# Patient Record
Sex: Female | Born: 1945 | Race: White | Hispanic: No | Smoking: Never smoker
Health system: Southern US, Community
[De-identification: ages and names within clinical notes are randomized; demographics above are authoritative.]

## PROBLEM LIST (undated history)

## (undated) DIAGNOSIS — E119 Type 2 diabetes mellitus without complications: Secondary | ICD-10-CM

## (undated) DIAGNOSIS — C9590 Leukemia, unspecified not having achieved remission: Secondary | ICD-10-CM

---

## 2016-05-27 ENCOUNTER — Inpatient Hospital Stay (HOSPITAL_COMMUNITY)
Admission: EM | Admit: 2016-05-27 | Discharge: 2016-06-11 | DRG: 853 | Disposition: A | Discharge status: Skilled Nursing Facility | Payer: Medicare Other | Source: Other Acute Inpatient Hospital | Attending: Internal Medicine | Admitting: Internal Medicine

## 2016-05-27 ENCOUNTER — Inpatient Hospital Stay (HOSPITAL_COMMUNITY): Payer: Self-pay

## 2016-05-27 ENCOUNTER — Inpatient Hospital Stay (HOSPITAL_COMMUNITY): Payer: Medicare Other

## 2016-05-27 ENCOUNTER — Encounter (HOSPITAL_COMMUNITY): Payer: Self-pay | Admitting: Pulmonary Disease

## 2016-05-27 ENCOUNTER — Ambulatory Visit (HOSPITAL_COMMUNITY): Payer: Medicare Other

## 2016-05-27 DIAGNOSIS — N179 Acute kidney failure, unspecified: Secondary | ICD-10-CM | POA: Diagnosis present

## 2016-05-27 DIAGNOSIS — E8729 Other acidosis: Secondary | ICD-10-CM | POA: Diagnosis present

## 2016-05-27 DIAGNOSIS — L97521 Non-pressure chronic ulcer of other part of left foot limited to breakdown of skin: Secondary | ICD-10-CM | POA: Diagnosis not present

## 2016-05-27 DIAGNOSIS — E1165 Type 2 diabetes mellitus with hyperglycemia: Secondary | ICD-10-CM | POA: Diagnosis present

## 2016-05-27 DIAGNOSIS — A419 Sepsis, unspecified organism: Secondary | ICD-10-CM

## 2016-05-27 DIAGNOSIS — B9561 Methicillin susceptible Staphylococcus aureus infection as the cause of diseases classified elsewhere: Secondary | ICD-10-CM | POA: Diagnosis not present

## 2016-05-27 DIAGNOSIS — J984 Other disorders of lung: Secondary | ICD-10-CM | POA: Diagnosis not present

## 2016-05-27 DIAGNOSIS — M86172 Other acute osteomyelitis, left ankle and foot: Secondary | ICD-10-CM | POA: Diagnosis present

## 2016-05-27 DIAGNOSIS — J9601 Acute respiratory failure with hypoxia: Secondary | ICD-10-CM | POA: Diagnosis present

## 2016-05-27 DIAGNOSIS — I639 Cerebral infarction, unspecified: Secondary | ICD-10-CM | POA: Diagnosis not present

## 2016-05-27 DIAGNOSIS — M009 Pyogenic arthritis, unspecified: Secondary | ICD-10-CM | POA: Diagnosis present

## 2016-05-27 DIAGNOSIS — R040 Epistaxis: Secondary | ICD-10-CM | POA: Diagnosis not present

## 2016-05-27 DIAGNOSIS — R109 Unspecified abdominal pain: Secondary | ICD-10-CM | POA: Diagnosis present

## 2016-05-27 DIAGNOSIS — G9341 Metabolic encephalopathy: Secondary | ICD-10-CM | POA: Diagnosis present

## 2016-05-27 DIAGNOSIS — M86672 Other chronic osteomyelitis, left ankle and foot: Secondary | ICD-10-CM

## 2016-05-27 DIAGNOSIS — G9340 Encephalopathy, unspecified: Secondary | ICD-10-CM | POA: Diagnosis not present

## 2016-05-27 DIAGNOSIS — I480 Paroxysmal atrial fibrillation: Secondary | ICD-10-CM | POA: Diagnosis not present

## 2016-05-27 DIAGNOSIS — A4101 Sepsis due to Methicillin susceptible Staphylococcus aureus: Secondary | ICD-10-CM | POA: Diagnosis present

## 2016-05-27 DIAGNOSIS — E1122 Type 2 diabetes mellitus with diabetic chronic kidney disease: Secondary | ICD-10-CM | POA: Diagnosis present

## 2016-05-27 DIAGNOSIS — E872 Acidosis: Secondary | ICD-10-CM | POA: Diagnosis present

## 2016-05-27 DIAGNOSIS — R7881 Bacteremia: Secondary | ICD-10-CM | POA: Diagnosis not present

## 2016-05-27 DIAGNOSIS — R579 Shock, unspecified: Secondary | ICD-10-CM | POA: Diagnosis not present

## 2016-05-27 DIAGNOSIS — N183 Chronic kidney disease, stage 3 (moderate): Secondary | ICD-10-CM | POA: Diagnosis present

## 2016-05-27 DIAGNOSIS — N170 Acute kidney failure with tubular necrosis: Secondary | ICD-10-CM | POA: Diagnosis present

## 2016-05-27 DIAGNOSIS — E1142 Type 2 diabetes mellitus with diabetic polyneuropathy: Secondary | ICD-10-CM

## 2016-05-27 DIAGNOSIS — E11621 Type 2 diabetes mellitus with foot ulcer: Secondary | ICD-10-CM | POA: Diagnosis not present

## 2016-05-27 DIAGNOSIS — E43 Unspecified severe protein-calorie malnutrition: Secondary | ICD-10-CM | POA: Diagnosis present

## 2016-05-27 DIAGNOSIS — R531 Weakness: Secondary | ICD-10-CM

## 2016-05-27 DIAGNOSIS — M7989 Other specified soft tissue disorders: Secondary | ICD-10-CM

## 2016-05-27 DIAGNOSIS — I76 Septic arterial embolism: Secondary | ICD-10-CM | POA: Diagnosis present

## 2016-05-27 DIAGNOSIS — I10 Essential (primary) hypertension: Secondary | ICD-10-CM | POA: Diagnosis present

## 2016-05-27 DIAGNOSIS — I2690 Septic pulmonary embolism without acute cor pulmonale: Secondary | ICD-10-CM | POA: Diagnosis present

## 2016-05-27 DIAGNOSIS — R0600 Dyspnea, unspecified: Secondary | ICD-10-CM | POA: Diagnosis not present

## 2016-05-27 DIAGNOSIS — Z4659 Encounter for fitting and adjustment of other gastrointestinal appliance and device: Secondary | ICD-10-CM

## 2016-05-27 DIAGNOSIS — T884XXA Failed or difficult intubation, initial encounter: Secondary | ICD-10-CM

## 2016-05-27 DIAGNOSIS — Z6833 Body mass index (BMI) 33.0-33.9, adult: Secondary | ICD-10-CM

## 2016-05-27 DIAGNOSIS — M868X7 Other osteomyelitis, ankle and foot: Secondary | ICD-10-CM | POA: Diagnosis not present

## 2016-05-27 DIAGNOSIS — E875 Hyperkalemia: Secondary | ICD-10-CM | POA: Diagnosis present

## 2016-05-27 DIAGNOSIS — K828 Other specified diseases of gallbladder: Secondary | ICD-10-CM | POA: Diagnosis present

## 2016-05-27 DIAGNOSIS — J189 Pneumonia, unspecified organism: Secondary | ICD-10-CM

## 2016-05-27 DIAGNOSIS — Z794 Long term (current) use of insulin: Secondary | ICD-10-CM

## 2016-05-27 DIAGNOSIS — D638 Anemia in other chronic diseases classified elsewhere: Secondary | ICD-10-CM | POA: Diagnosis present

## 2016-05-27 DIAGNOSIS — R262 Difficulty in walking, not elsewhere classified: Secondary | ICD-10-CM

## 2016-05-27 DIAGNOSIS — D6959 Other secondary thrombocytopenia: Secondary | ICD-10-CM | POA: Diagnosis not present

## 2016-05-27 DIAGNOSIS — R1084 Generalized abdominal pain: Secondary | ICD-10-CM

## 2016-05-27 DIAGNOSIS — Z95828 Presence of other vascular implants and grafts: Secondary | ICD-10-CM | POA: Diagnosis not present

## 2016-05-27 DIAGNOSIS — R29708 NIHSS score 8: Secondary | ICD-10-CM | POA: Diagnosis not present

## 2016-05-27 DIAGNOSIS — R6521 Severe sepsis with septic shock: Secondary | ICD-10-CM | POA: Diagnosis present

## 2016-05-27 DIAGNOSIS — L97529 Non-pressure chronic ulcer of other part of left foot with unspecified severity: Secondary | ICD-10-CM | POA: Diagnosis not present

## 2016-05-27 DIAGNOSIS — I340 Nonrheumatic mitral (valve) insufficiency: Secondary | ICD-10-CM | POA: Diagnosis not present

## 2016-05-27 DIAGNOSIS — C9110 Chronic lymphocytic leukemia of B-cell type not having achieved remission: Secondary | ICD-10-CM | POA: Diagnosis present

## 2016-05-27 DIAGNOSIS — Z9181 History of falling: Secondary | ICD-10-CM

## 2016-05-27 DIAGNOSIS — Z89429 Acquired absence of other toe(s), unspecified side: Secondary | ICD-10-CM

## 2016-05-27 DIAGNOSIS — Z79899 Other long term (current) drug therapy: Secondary | ICD-10-CM

## 2016-05-27 DIAGNOSIS — E1169 Type 2 diabetes mellitus with other specified complication: Secondary | ICD-10-CM | POA: Diagnosis present

## 2016-05-27 DIAGNOSIS — L97509 Non-pressure chronic ulcer of other part of unspecified foot with unspecified severity: Secondary | ICD-10-CM

## 2016-05-27 DIAGNOSIS — B9689 Other specified bacterial agents as the cause of diseases classified elsewhere: Secondary | ICD-10-CM | POA: Diagnosis not present

## 2016-05-27 DIAGNOSIS — Z452 Encounter for adjustment and management of vascular access device: Secondary | ICD-10-CM

## 2016-05-27 DIAGNOSIS — M869 Osteomyelitis, unspecified: Secondary | ICD-10-CM | POA: Diagnosis not present

## 2016-05-27 HISTORY — DX: Type 2 diabetes mellitus without complications: E11.9

## 2016-05-27 HISTORY — DX: Leukemia, unspecified not having achieved remission: C95.90

## 2016-05-27 LAB — COMPREHENSIVE METABOLIC PANEL
ALT: 39 U/L (ref 14–54)
AST: 57 U/L — AB (ref 15–41)
Albumin: 1.8 g/dL — ABNORMAL LOW (ref 3.5–5.0)
Alkaline Phosphatase: 164 U/L — ABNORMAL HIGH (ref 38–126)
Anion gap: 27 — ABNORMAL HIGH (ref 5–15)
BILIRUBIN TOTAL: 0.9 mg/dL (ref 0.3–1.2)
BUN: 109 mg/dL — AB (ref 6–20)
CHLORIDE: 100 mmol/L — AB (ref 101–111)
CO2: 10 mmol/L — AB (ref 22–32)
Calcium: 6.6 mg/dL — ABNORMAL LOW (ref 8.9–10.3)
Creatinine, Ser: 6.29 mg/dL — ABNORMAL HIGH (ref 0.44–1.00)
GFR calc Af Amer: 7 mL/min — ABNORMAL LOW (ref >60)
GFR calc non Af Amer: 6 mL/min — ABNORMAL LOW (ref >60)
GLUCOSE: 283 mg/dL — AB (ref 65–99)
POTASSIUM: 5.3 mmol/L — AB (ref 3.5–5.1)
SODIUM: 137 mmol/L (ref 135–145)
TOTAL PROTEIN: 4.3 g/dL — AB (ref 6.5–8.1)

## 2016-05-27 LAB — CBC
HCT: 35 % — ABNORMAL LOW (ref 36.0–46.0)
HCT: 31.6 % — ABNORMAL LOW (ref 36.0–46.0)
Hemoglobin: 11.4 g/dL — ABNORMAL LOW (ref 12.0–15.0)
Hemoglobin: 10.9 g/dL — ABNORMAL LOW (ref 12.0–15.0)
MCH: 27.3 pg (ref 26.0–34.0)
MCH: 27.5 pg (ref 26.0–34.0)
MCHC: 32.6 g/dL (ref 30.0–36.0)
MCHC: 34.5 g/dL (ref 30.0–36.0)
MCV: 83.7 fL (ref 78.0–100.0)
MCV: 79.8 fL (ref 78.0–100.0)
PLATELETS: 164 10*3/uL (ref 150–400)
PLATELETS: 125 10*3/uL — AB (ref 150–400)
RBC: 4.18 MIL/uL (ref 3.87–5.11)
RBC: 3.96 MIL/uL (ref 3.87–5.11)
RDW: 15.6 % — AB (ref 11.5–15.5)
RDW: 14.7 % (ref 11.5–15.5)
WBC: 30.9 10*3/uL — ABNORMAL HIGH (ref 4.0–10.5)
WBC: 21.9 10*3/uL — AB (ref 4.0–10.5)

## 2016-05-27 LAB — BLOOD GAS, ARTERIAL
Acid-base deficit: 14.3 mmol/L — ABNORMAL HIGH (ref 0.0–2.0)
Bicarbonate: 12.3 mmol/L — ABNORMAL LOW (ref 20.0–28.0)
DRAWN BY: 362771
FIO2: 100
MECHVT: 500 mL
O2 Saturation: 98.5 %
PEEP: 5 cmH2O
PO2 ART: 331 mmHg — AB (ref 83.0–108.0)
Patient temperature: 98.6
RATE: 26 resp/min
pCO2 arterial: 33.6 mmHg (ref 32.0–48.0)
pH, Arterial: 7.188 — CL (ref 7.350–7.450)

## 2016-05-27 LAB — RESPIRATORY PANEL BY PCR
Adenovirus: NOT DETECTED
Bordetella pertussis: NOT DETECTED
CORONAVIRUS 229E-RVPPCR: NOT DETECTED
CORONAVIRUS HKU1-RVPPCR: NOT DETECTED
CORONAVIRUS NL63-RVPPCR: NOT DETECTED
CORONAVIRUS OC43-RVPPCR: NOT DETECTED
Chlamydophila pneumoniae: NOT DETECTED
Influenza A: NOT DETECTED
Influenza B: NOT DETECTED
MYCOPLASMA PNEUMONIAE-RVPPCR: NOT DETECTED
Metapneumovirus: NOT DETECTED
PARAINFLUENZA VIRUS 1-RVPPCR: NOT DETECTED
Parainfluenza Virus 2: NOT DETECTED
Parainfluenza Virus 3: NOT DETECTED
Parainfluenza Virus 4: NOT DETECTED
Respiratory Syncytial Virus: NOT DETECTED
Rhinovirus / Enterovirus: DETECTED — AB

## 2016-05-27 LAB — URINALYSIS, ROUTINE W REFLEX MICROSCOPIC
Bilirubin Urine: NEGATIVE
Glucose, UA: 150 mg/dL — AB
KETONES UR: NEGATIVE mg/dL
Leukocytes, UA: NEGATIVE
Nitrite: NEGATIVE
PROTEIN: 100 mg/dL — AB
Specific Gravity, Urine: 1.023 (ref 1.005–1.030)
pH: 5 (ref 5.0–8.0)

## 2016-05-27 LAB — POCT I-STAT 3, ART BLOOD GAS (G3+)
Acid-base deficit: 12 mmol/L — ABNORMAL HIGH (ref 0.0–2.0)
Bicarbonate: 13.4 mmol/L — ABNORMAL LOW (ref 20.0–28.0)
O2 Saturation: 99 %
Patient temperature: 98.3
TCO2: 14 mmol/L (ref 0–100)
pCO2 arterial: 28.7 mmHg — ABNORMAL LOW (ref 32.0–48.0)
pH, Arterial: 7.277 — ABNORMAL LOW (ref 7.350–7.450)
pO2, Arterial: 142 mmHg — ABNORMAL HIGH (ref 83.0–108.0)

## 2016-05-27 LAB — CBC WITH DIFFERENTIAL/PLATELET
BASOS ABS: 0 10*3/uL (ref 0.0–0.1)
Basophils Relative: 0 %
EOS PCT: 0 %
Eosinophils Absolute: 0 10*3/uL (ref 0.0–0.7)
HEMATOCRIT: 35.3 % — AB (ref 36.0–46.0)
Hemoglobin: 11.2 g/dL — ABNORMAL LOW (ref 12.0–15.0)
Lymphocytes Relative: 52 %
Lymphs Abs: 17.5 10*3/uL — ABNORMAL HIGH (ref 0.7–4.0)
MCH: 26.9 pg (ref 26.0–34.0)
MCHC: 31.7 g/dL (ref 30.0–36.0)
MCV: 84.9 fL (ref 78.0–100.0)
MONO ABS: 1 10*3/uL (ref 0.1–1.0)
MONOS PCT: 3 %
Neutro Abs: 15.2 10*3/uL — ABNORMAL HIGH (ref 1.7–7.7)
Neutrophils Relative %: 45 %
Platelets: 212 10*3/uL (ref 150–400)
RBC: 4.16 MIL/uL (ref 3.87–5.11)
RDW: 15.3 % (ref 11.5–15.5)
WBC: 33.7 10*3/uL — AB (ref 4.0–10.5)

## 2016-05-27 LAB — GLUCOSE, CAPILLARY
GLUCOSE-CAPILLARY: 121 mg/dL — AB (ref 65–99)
GLUCOSE-CAPILLARY: 135 mg/dL — AB (ref 65–99)
GLUCOSE-CAPILLARY: 138 mg/dL — AB (ref 65–99)
GLUCOSE-CAPILLARY: 143 mg/dL — AB (ref 65–99)
GLUCOSE-CAPILLARY: 205 mg/dL — AB (ref 65–99)
GLUCOSE-CAPILLARY: 295 mg/dL — AB (ref 65–99)
GLUCOSE-CAPILLARY: 303 mg/dL — AB (ref 65–99)
Glucose-Capillary: 287 mg/dL — ABNORMAL HIGH (ref 65–99)
Glucose-Capillary: 303 mg/dL — ABNORMAL HIGH (ref 65–99)
Glucose-Capillary: 244 mg/dL — ABNORMAL HIGH (ref 65–99)
Glucose-Capillary: 186 mg/dL — ABNORMAL HIGH (ref 65–99)
Glucose-Capillary: 135 mg/dL — ABNORMAL HIGH (ref 65–99)
Glucose-Capillary: 137 mg/dL — ABNORMAL HIGH (ref 65–99)
Glucose-Capillary: 108 mg/dL — ABNORMAL HIGH (ref 65–99)
Glucose-Capillary: 141 mg/dL — ABNORMAL HIGH (ref 65–99)

## 2016-05-27 LAB — BASIC METABOLIC PANEL
Anion gap: 28 — ABNORMAL HIGH (ref 5–15)
Anion gap: 21 — ABNORMAL HIGH (ref 5–15)
BUN: 112 mg/dL — ABNORMAL HIGH (ref 6–20)
BUN: 109 mg/dL — ABNORMAL HIGH (ref 6–20)
CO2: 13 mmol/L — AB (ref 22–32)
CO2: 21 mmol/L — AB (ref 22–32)
CREATININE: 6.28 mg/dL — AB (ref 0.44–1.00)
CREATININE: 5.7 mg/dL — AB (ref 0.44–1.00)
Calcium: 6.4 mg/dL — CL (ref 8.9–10.3)
Calcium: 5.9 mg/dL — CL (ref 8.9–10.3)
Chloride: 98 mmol/L — ABNORMAL LOW (ref 101–111)
Chloride: 95 mmol/L — ABNORMAL LOW (ref 101–111)
GFR calc Af Amer: 7 mL/min — ABNORMAL LOW (ref >60)
GFR calc non Af Amer: 6 mL/min — ABNORMAL LOW (ref >60)
GFR calc non Af Amer: 7 mL/min — ABNORMAL LOW (ref >60)
GFR, EST AFRICAN AMERICAN: 8 mL/min — AB (ref >60)
GLUCOSE: 262 mg/dL — AB (ref 65–99)
GLUCOSE: 172 mg/dL — AB (ref 65–99)
Potassium: 4.5 mmol/L (ref 3.5–5.1)
Potassium: 4.6 mmol/L (ref 3.5–5.1)
Sodium: 139 mmol/L (ref 135–145)
Sodium: 137 mmol/L (ref 135–145)

## 2016-05-27 LAB — LACTIC ACID, PLASMA
LACTIC ACID, VENOUS: 8.2 mmol/L — AB (ref 0.5–1.9)
LACTIC ACID, VENOUS: 10.8 mmol/L — AB (ref 0.5–1.9)

## 2016-05-27 LAB — SODIUM, URINE, RANDOM: Sodium, Ur: 28 mmol/L

## 2016-05-27 LAB — CREATININE, URINE, RANDOM: CREATININE, URINE: 169.65 mg/dL

## 2016-05-27 LAB — INFLUENZA PANEL BY PCR (TYPE A & B)
INFLAPCR: NEGATIVE
Influenza B By PCR: NEGATIVE

## 2016-05-27 LAB — ECHOCARDIOGRAM COMPLETE: Weight: 3019.42 oz

## 2016-05-27 LAB — TROPONIN I
TROPONIN I: 0.29 ng/mL — AB (ref <0.03)
TROPONIN I: 0.44 ng/mL — AB (ref <0.03)
Troponin I: 0.34 ng/mL (ref <0.03)

## 2016-05-27 LAB — PHOSPHORUS: PHOSPHORUS: 9.2 mg/dL — AB (ref 2.5–4.6)

## 2016-05-27 LAB — CK: CK TOTAL: 1191 U/L — AB (ref 38–234)

## 2016-05-27 LAB — PATHOLOGIST SMEAR REVIEW

## 2016-05-27 LAB — BETA-HYDROXYBUTYRIC ACID: Beta-Hydroxybutyric Acid: 0.57 mmol/L — ABNORMAL HIGH (ref 0.05–0.27)

## 2016-05-27 LAB — PROTIME-INR
INR: 1.5
Prothrombin Time: 18.3 seconds — ABNORMAL HIGH (ref 11.4–15.2)

## 2016-05-27 LAB — LIPID PANEL
CHOLESTEROL: 62 mg/dL (ref 0–200)
HDL: <10 mg/dL — ABNORMAL LOW (ref >40)
TRIGLYCERIDES: 291 mg/dL — AB (ref <150)
VLDL: 58 mg/dL — AB (ref 0–40)

## 2016-05-27 LAB — MAGNESIUM: MAGNESIUM: 1.2 mg/dL — AB (ref 1.7–2.4)

## 2016-05-27 LAB — MRSA PCR SCREENING: MRSA BY PCR: NEGATIVE

## 2016-05-27 MED ORDER — MIDAZOLAM HCL 2 MG/2ML IJ SOLN
INTRAMUSCULAR | Status: AC
  Administered 2016-05-27: 2 mg
  Filled 2016-05-27: qty 2

## 2016-05-27 MED ORDER — MIDAZOLAM HCL 2 MG/2ML IJ SOLN: 1.0000 mg | INTRAMUSCULAR | Status: DC | PRN

## 2016-05-27 MED ORDER — FAMOTIDINE IN NACL 20-0.9 MG/50ML-% IV SOLN
20.0000 mg | INTRAVENOUS | Status: DC
  Filled 2016-05-27: qty 50

## 2016-05-27 MED ORDER — STERILE WATER FOR INJECTION IV SOLN: INTRAVENOUS | Status: DC

## 2016-05-27 MED ORDER — STERILE WATER FOR INJECTION IV SOLN
INTRAVENOUS | Status: DC
  Administered 2016-05-27 – 2016-05-28 (×7): via INTRAVENOUS_CENTRAL
  Filled 2016-05-27 (×16): qty 150

## 2016-05-27 MED ORDER — SODIUM CHLORIDE 0.9 % IV SOLN: 250.0000 mL | INTRAVENOUS | Status: DC | PRN

## 2016-05-27 MED ORDER — INSULIN ASPART 100 UNIT/ML ~~LOC~~ SOLN
1.0000 [IU] | SUBCUTANEOUS | Status: DC
  Administered 2016-05-28: 2 [IU] via SUBCUTANEOUS
  Administered 2016-05-28: 1 [IU] via SUBCUTANEOUS
  Administered 2016-05-28 (×2): 2 [IU] via SUBCUTANEOUS

## 2016-05-27 MED ORDER — CHLORHEXIDINE GLUCONATE 0.12% ORAL RINSE (MEDLINE KIT)
15.0000 mL | Freq: Two times a day (BID) | OROMUCOSAL | Status: DC
  Administered 2016-05-27 – 2016-05-30 (×7): 15 mL via OROMUCOSAL

## 2016-05-27 MED ORDER — SODIUM CHLORIDE 0.9 % IV SOLN: 500.0000 mg | INTRAVENOUS | Status: DC

## 2016-05-27 MED ORDER — DEXTROSE 5 % IV SOLN
0.0000 ug/min | INTRAVENOUS | Status: DC
  Filled 2016-05-27: qty 4

## 2016-05-27 MED ORDER — SODIUM CHLORIDE 0.9 % IV SOLN
250.0000 mL | INTRAVENOUS | Status: DC | PRN
Start: 2016-05-27 — End: 2016-06-11

## 2016-05-27 MED ORDER — PANTOPRAZOLE SODIUM 40 MG IV SOLR
40.0000 mg | Freq: Every day | INTRAVENOUS | Status: DC
  Administered 2016-05-27 – 2016-05-30 (×4): 40 mg via INTRAVENOUS
  Filled 2016-05-27 (×4): qty 40

## 2016-05-27 MED ORDER — ORAL CARE MOUTH RINSE
15.0000 mL | Freq: Four times a day (QID) | OROMUCOSAL | Status: DC
  Administered 2016-05-27 – 2016-05-30 (×14): 15 mL via OROMUCOSAL

## 2016-05-27 MED ORDER — INSULIN ASPART 100 UNIT/ML ~~LOC~~ SOLN
10.0000 [IU] | Freq: Once | SUBCUTANEOUS | Status: AC
  Administered 2016-05-27: 10 [IU] via INTRAVENOUS

## 2016-05-27 MED ORDER — HEPARIN (PORCINE) 2000 UNITS/L FOR CRRT
INTRAVENOUS_CENTRAL | Status: DC | PRN
  Administered 2016-05-28: 20:00:00 via INTRAVENOUS_CENTRAL
  Filled 2016-05-27 (×2): qty 1000

## 2016-05-27 MED ORDER — NOREPINEPHRINE BITARTRATE 1 MG/ML IV SOLN
0.0000 ug/min | INTRAVENOUS | Status: DC
  Administered 2016-05-27: 45 ug/min via INTRAVENOUS
  Administered 2016-05-27: 50 ug/min via INTRAVENOUS
  Administered 2016-05-27: 25 ug/min via INTRAVENOUS
  Administered 2016-05-28: 20 ug/min via INTRAVENOUS
  Administered 2016-05-28: 26 ug/min via INTRAVENOUS
  Administered 2016-05-29: 18 ug/min via INTRAVENOUS
  Administered 2016-05-29: 26.027 ug/min via INTRAVENOUS
  Administered 2016-05-31: 4.053 ug/min via INTRAVENOUS
  Administered 2016-05-31: 0 ug/min via INTRAVENOUS
  Filled 2016-05-27 (×10): qty 16

## 2016-05-27 MED ORDER — SODIUM CHLORIDE 0.9 % IV SOLN
INTRAVENOUS | Status: DC
  Administered 2016-05-27: 2.4 [IU]/h via INTRAVENOUS
  Filled 2016-05-27: qty 2.5

## 2016-05-27 MED ORDER — DEXTROSE 10 % IV SOLN: INTRAVENOUS | Status: DC | PRN

## 2016-05-27 MED ORDER — INSULIN ASPART 100 UNIT/ML ~~LOC~~ SOLN: 2.0000 [IU] | SUBCUTANEOUS | Status: DC

## 2016-05-27 MED ORDER — FENTANYL CITRATE (PF) 100 MCG/2ML IJ SOLN
INTRAMUSCULAR | Status: AC
  Administered 2016-05-27: 100 ug
  Filled 2016-05-27: qty 2

## 2016-05-27 MED ORDER — MEROPENEM 1 G IV SOLR
1.0000 g | Freq: Once | INTRAVENOUS | Status: AC
  Administered 2016-05-27: 1 g via INTRAVENOUS
  Filled 2016-05-27: qty 1

## 2016-05-27 MED ORDER — STROKE: EARLY STAGES OF RECOVERY BOOK
Freq: Once | Status: DC
  Filled 2016-05-27: qty 1

## 2016-05-27 MED ORDER — ASPIRIN 81 MG PO CHEW: 324.0000 mg | CHEWABLE_TABLET | ORAL | Status: DC

## 2016-05-27 MED ORDER — FAMOTIDINE IN NACL 20-0.9 MG/50ML-% IV SOLN
20.0000 mg | Freq: Two times a day (BID) | INTRAVENOUS | Status: DC
  Filled 2016-05-27: qty 50

## 2016-05-27 MED ORDER — MIDAZOLAM HCL 2 MG/2ML IJ SOLN
1.0000 mg | INTRAMUSCULAR | Status: DC | PRN
  Filled 2016-05-27: qty 2

## 2016-05-27 MED ORDER — PRISMASOL BGK 4/2.5 32-4-2.5 MEQ/L IV SOLN
INTRAVENOUS | Status: DC
  Administered 2016-05-27 – 2016-05-29 (×7): via INTRAVENOUS_CENTRAL
  Filled 2016-05-27 (×9): qty 5000

## 2016-05-27 MED ORDER — VASOPRESSIN 20 UNIT/ML IV SOLN
0.0300 [IU]/min | INTRAVENOUS | Status: DC
  Administered 2016-05-27 – 2016-05-31 (×4): 0.03 [IU]/min via INTRAVENOUS
  Filled 2016-05-27 (×5): qty 2

## 2016-05-27 MED ORDER — EPINEPHRINE PF 1 MG/ML IJ SOLN
0.5000 ug/min | INTRAVENOUS | Status: DC
  Administered 2016-05-27: 6 ug/min via INTRAVENOUS
  Filled 2016-05-27 (×2): qty 4

## 2016-05-27 MED ORDER — SODIUM CHLORIDE 0.9 % IV SOLN
1.0000 g | Freq: Two times a day (BID) | INTRAVENOUS | Status: DC
  Administered 2016-05-27 – 2016-05-28 (×2): 1 g via INTRAVENOUS
  Filled 2016-05-27 (×3): qty 1

## 2016-05-27 MED ORDER — ASPIRIN 325 MG PO TABS
325.0000 mg | ORAL_TABLET | Freq: Once | ORAL | Status: AC
  Administered 2016-05-27: 325 mg via ORAL
  Filled 2016-05-27: qty 1

## 2016-05-27 MED ORDER — SODIUM BICARBONATE 8.4 % IV SOLN
INTRAVENOUS | Status: AC
  Administered 2016-05-27 (×2): via INTRAVENOUS
  Filled 2016-05-27 (×6): qty 150

## 2016-05-27 MED ORDER — LINEZOLID 600 MG/300ML IV SOLN
600.0000 mg | Freq: Two times a day (BID) | INTRAVENOUS | Status: DC
  Administered 2016-05-27 – 2016-05-28 (×3): 600 mg via INTRAVENOUS
  Filled 2016-05-27 (×3): qty 300

## 2016-05-27 MED ORDER — PRISMASOL BGK 4/2.5 32-4-2.5 MEQ/L IV SOLN
INTRAVENOUS | Status: DC
  Administered 2016-05-27 – 2016-05-30 (×18): via INTRAVENOUS_CENTRAL
  Filled 2016-05-27 (×35): qty 5000

## 2016-05-27 MED ORDER — HYDROCORTISONE NA SUCCINATE PF 100 MG IJ SOLR
50.0000 mg | Freq: Four times a day (QID) | INTRAMUSCULAR | Status: DC
  Administered 2016-05-27 – 2016-06-01 (×20): 50 mg via INTRAVENOUS
  Filled 2016-05-27 (×4): qty 1
  Filled 2016-05-27: qty 2
  Filled 2016-05-27 (×12): qty 1
  Filled 2016-05-27: qty 2
  Filled 2016-05-27 (×5): qty 1

## 2016-05-27 MED ORDER — ASPIRIN 300 MG RE SUPP: 300.0000 mg | RECTAL | Status: DC

## 2016-05-27 MED ORDER — HEPARIN SODIUM (PORCINE) 5000 UNIT/ML IJ SOLN
5000.0000 [IU] | Freq: Three times a day (TID) | INTRAMUSCULAR | Status: DC
  Administered 2016-05-27 – 2016-06-01 (×14): 5000 [IU] via SUBCUTANEOUS
  Filled 2016-05-27 (×16): qty 1

## 2016-05-27 MED ORDER — FENTANYL CITRATE (PF) 100 MCG/2ML IJ SOLN
50.0000 ug | INTRAMUSCULAR | Status: DC | PRN
  Administered 2016-05-27: 50 ug via INTRAVENOUS
  Administered 2016-05-28: 100 ug via INTRAVENOUS
  Administered 2016-05-28 – 2016-05-29 (×2): 50 ug via INTRAVENOUS
  Filled 2016-05-27 (×4): qty 2

## 2016-05-27 MED ORDER — SODIUM BICARBONATE 8.4 % IV SOLN
INTRAVENOUS | Status: AC
  Administered 2016-05-27: 06:00:00
  Filled 2016-05-27: qty 100

## 2016-05-27 MED ORDER — HEPARIN SODIUM (PORCINE) 1000 UNIT/ML DIALYSIS
1000.0000 [IU] | INTRAMUSCULAR | Status: DC | PRN
  Administered 2016-05-30: 2400 [IU] via INTRAVENOUS_CENTRAL
  Filled 2016-05-27 (×2): qty 6
  Filled 2016-05-27: qty 3

## 2016-05-27 MED ORDER — SODIUM BICARBONATE 8.4 % IV SOLN
INTRAVENOUS | Status: AC
  Administered 2016-05-27: 50 meq
  Filled 2016-05-27: qty 50

## 2016-05-27 MED ORDER — INSULIN GLARGINE 100 UNIT/ML ~~LOC~~ SOLN
10.0000 [IU] | SUBCUTANEOUS | Status: DC
  Administered 2016-05-27: 10 [IU] via SUBCUTANEOUS
  Filled 2016-05-27 (×2): qty 0.1

## 2016-05-27 MED ORDER — FENTANYL CITRATE (PF) 100 MCG/2ML IJ SOLN: 50.0000 ug | INTRAMUSCULAR | Status: DC | PRN

## 2016-05-27 NOTE — Progress Notes (Signed)
RT attempted to get an ABG from patient and was unsuccessful after several attempts between two therapist. RN notified.

## 2016-05-27 NOTE — Procedures (Signed)
Arterial Catheter Insertion Procedure Note Tracy Barker [id number] 07/27/1945  Procedure: Insertion of Arterial Catheter  Indications: Blood pressure monitoring and Frequent blood sampling  Procedure Details Consent: Unable to obtain consent because of altered level of consciousness. Time Out: Verified patient identification, verified procedure, site/side was marked, verified correct patient position, special equipment/implants available, medications/allergies/relevent history reviewed, required imaging and test results available.  Performed Real time [place] used to ID and cannulate vessel  Maximum sterile technique was used including antiseptics, cap, gloves, gown, hand hygiene, mask and sheet. Skin prep: Chlorhexidine; local anesthetic administered 20 gauge catheter was inserted into left femoral artery using the Seldinger technique.  Evaluation Blood flow good; BP tracing good. Complications: No apparent complications.   Tracy Barker 05/27/2016 Tracy Barker ACNP-BC [place] Pager # [phone] OR # [phone] if no answer

## 2016-05-27 NOTE — Progress Notes (Signed)
Patient admitted from Hospital in [place]. [name] At bedside. Intubation performed with 100 fent 2 versed  50 Roc 20 Amidate

## 2016-05-27 NOTE — Consult Note (Signed)
Reason for Consult: Acute kidney injury, metabolic acidosis, hyperkalemia Referring Physician: Tera Partridge M.D. (CCM)  HPI: (Obtained from review of chart as patient is intubated and does not have any family around)  71 year old Caucasian woman with past medical history significant for CLL (unclear treatment status), hypertension and diabetes was transferred to this hospital from the emergency room at [facility] for the management of severe sepsis after she presented there with cough and weakness. Reports noted of recent vomiting. Upon transfer here, was having increasing work of breathing/changes in mentation with inability to protect airway for which she was intubated. She was also started on pressors for shock and workup is underway for etiology of sepsis. There is concern of mesenteric/gut ischemia given resident dictation findings and elevated lactic acid level (of note, she was taking metformin). A review of her previous medical records shows that she has chronic kidney disease stage III and her baseline creatinine is about 1.2-1.3 (GFR 41-45 mL/min). She was on hydrochlorothiazide and ramipril prior to admission.  Upon arrival she was found to be in acute renal failure with a creatinine of 6.3/BUN 109 and profound metabolic acidosis with a bicarbonate of 10 and hyperkalemia of 5.3 after temporizing measures given. Her lactic acid level was elevated at 10.8.  Past Medical History:  Diagnosis Date  . Diabetes mellitus ([place])    Type 2  . Leukemia ([place])    not specified    No past surgical history on file.  No family history on file.  Social History:  has no tobacco, alcohol, and drug history on file.  Allergies:  Allergies  Allergen Reactions  . Amlodipine   . Clindamycin/Lincomycin   . Zosyn [Piperacillin Sod-Tazobactam So]     Medications:  Scheduled: .  stroke: mapping our early stages of recovery book   Does not apply Once  . chlorhexidine gluconate (MEDLINE KIT)   15 mL Mouth Rinse BID  . heparin  5,000 Units Subcutaneous Q8H  . hydrocortisone sod succinate (SOLU-CORTEF) inj  50 mg Intravenous Q6H  . linezolid (ZYVOX) IV  600 mg Intravenous Q12H  . mouth rinse  15 mL Mouth Rinse QID  . [START ON 05/28/2016] meropenem (MERREM) IV  500 mg Intravenous Q24H  . pantoprazole (PROTONIX) IV  40 mg Intravenous QHS    BMP Latest Ref Rng & Units 05/27/2016  Glucose 65 - 99 mg/dL 283(H)  BUN 6 - 20 mg/dL 109(H)  Creatinine 0.44 - 1.00 mg/dL 6.29(H)  Sodium 135 - 145 mmol/L 137  Potassium 3.5 - 5.1 mmol/L 5.3(H)  Chloride 101 - 111 mmol/L 100(L)  CO2 22 - 32 mmol/L 10(L)  Calcium 8.9 - 10.3 mg/dL 6.6(L)   CBC Latest Ref Rng & Units 05/27/2016 05/27/2016  WBC 4.0 - 10.5 K/uL 30.9(H) 33.7(H)  Hemoglobin 12.0 - 15.0 g/dL 11.4(L) 11.2(L)  Hematocrit 36.0 - 46.0 % 35.0(L) 35.3(L)  Platelets 150 - 400 K/uL 164 212     Dg Chest Port 1 View  Result Date: 05/27/2016 CLINICAL DATA:  Difficulty intubation.  Central line placement. EXAM: PORTABLE CHEST 1 VIEW COMPARISON:  None. FINDINGS: Cardiac enlargement without vascular congestion. Right hilum is prominent. This could be due to central pulmonary artery dilatation nor 2 lymphadenopathy. Consider CT for further evaluation. Vague nodular opacities in the mid lungs measuring less than 1 cm. These could also be evaluated at CT. No focal consolidation or airspace disease. No blunting of costophrenic angles. No pneumothorax. Tortuous aorta. Endotracheal tube tip measures 2.2 cm above the carina. IMPRESSION:  Endotracheal tube tip measures 2.2 cm above the carina. Cardiac enlargement. Nonspecific prominence of the right hilum with small nodular opacities in both lungs are indeterminate. CT recommended for further evaluation. Electronically Signed   By: Lucienne Capers M.D.   On: 05/27/2016 05:41   Dg Abd Portable 1v  Result Date: 05/27/2016 CLINICAL DATA:  Orogastric tube placement EXAM: PORTABLE ABDOMEN - 1 VIEW COMPARISON:   None. FINDINGS: Orogastric tube tip and side port are in the stomach. There is a catheter on the right placed in the right femoral region with tip at the expected level of the right common iliac vein. There is no bowel dilatation or air-fluid levels suggesting bowel obstruction. No free air. 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 IMPRESSION: Orogastric tube tip and side port in stomach. Femoral catheter on the right with tip in the expected location of the right common iliac vein. Bowel gas pattern unremarkable. No bowel obstruction or free air evident. Electronically Signed   By: Lowella Grip III M.D.   On: 05/27/2016 08:39    Review of Systems  Unable to perform ROS: Intubated   Blood pressure (!) 102/56, pulse (!) 106, temperature 99 F (37.2 C), temperature source Oral, resp. rate (!) 35, weight 85.6 kg (188 lb 11.4 oz), SpO2 99 %. Physical Exam  Nursing note and vitals reviewed. Constitutional: She appears well-developed and well-nourished.  Intubated  HENT:  Head: Normocephalic and atraumatic.  Nose: Nose normal.  Eyes: Conjunctivae and EOM are normal. Pupils are equal, round, and reactive to light.  Neck: Normal range of motion. Neck supple. No JVD present.  Cardiovascular: Regular rhythm.  Exam reveals no friction rub.   Murmur heard. Regular tachycardia. S1 and S2 with ejection systolic murmur  Respiratory: Breath sounds normal. She has no wheezes. She has no rales.  Clear breath sounds while on ventilator  GI: Soft. Bowel sounds are normal. She exhibits distension. There is tenderness. There is no rebound.  Musculoskeletal: She exhibits edema.  Trace ankle edema. Dusky toes left foot with ulceration over fourth toe.   Skin: Skin is warm and dry.  Ulcers noted over dorsum of left foot fourth toe as well as sacral area.    Assessment/Plan: 1. AKI on CKD stage 3: This is likely to be ATN associated with sepsis-with probable hemodynamic involvement while on ACE  inhibitor/diuretic if she indeed became significantly volume depleted with recent episodes of vomiting. Renal ultrasound pending, will send off for urinalysis and some urine electrolytes. She appears to be anuric at this time and will likely need intervention with renal replacement therapy-CRRT given her hemodynamic instability/hypotension. Volume status is a little on the hypervolemic and and concomitant metabolic acidosis/hyperkalemia require intervention. 2. Sepsis: Unclear source, suspected intra-abdominal pathology including mesenteric ischemia based on elevated lactic acid level-indeed the latter may have been from acute renal failure in the face of ongoing metformin use. 3. Mixed anion-gap and non-anion gap acidosis: From acute renal failure/metformin and recent GI losses. Ongoing isotonic sodium bicarbonate drip at 150 mL/h-this will be discontinued when CRRT started 4. Hyperkalemia: Improved with Kayexalate and additional temporizing measures, continue to monitor with ongoing glycemic control with definitive management with dialysis. 5. Abdominal pain: Raising suspicion of mesenteric/blood ischemia, CT scan of the abdomen ordered.  Leondro Coryell K. 05/27/2016, 12:52 PM

## 2016-05-27 NOTE — Progress Notes (Signed)
SLP Cancellation Note  Patient Details Name: Tracy Barker MRN: [id number] DOB: Feb 19, 1946   Cancelled treatment:       Reason Eval/Treat Not Completed: Patient not medically ready. Orders received for cognitive-linguistic evaluation. Pt is currently intubated. Will follow for readiness to participate.   Germain Osgood 05/27/2016, 8:52 AM  Germain Osgood, M.A. CCC-SLP [phone]

## 2016-05-27 NOTE — Progress Notes (Signed)
CRITICAL VALUE ALERT  Critical value received:  Troponin 0.29    Lactic Acid 8.2  Date of notification:  05/27/16  Time of notification:  0640  Critical value read back:Yes.    Nurse who received alert:  Donald Siva RN  MD notified (1st page):  Carly Rivet  Time of first page:  [phone]  MD notified (2nd page):  Time of second page:  Responding MD:  Albin Felling  Time MD responded:  [phone]

## 2016-05-27 NOTE — Progress Notes (Signed)
  Echocardiogram 2D Echocardiogram has been performed.  Tracy Barker 05/27/2016, 11:49 AM

## 2016-05-27 NOTE — Progress Notes (Signed)
Pharmacy Antibiotic Note  Tracy Barker is a 71 y.o. female admitted on 05/27/2016 with sepsis.  Pharmacy has been consulted for zyvox and meropenem dosing. Patient was transferred from [facility] and was given Zyvox there (little information is available at this time). -labs pending   Plan: -Zyvox 600mg  IV q12h -Meropenem 1gm IV x1 -Will follow renal function for further dosing of meropenem -Will follow renal function, cultures and clinical progress    Weight: 188 lb 11.4 oz (85.6 kg)  Temp (24hrs), Avg:98.6 F (37 C), Min:98.6 F (37 C), Max:98.6 F (37 C)   Recent Labs Lab 05/27/16 0520  WBC 33.7*    CrCl cannot be calculated (No order found.).    Allergies  Allergen Reactions  . Amlodipine   . Clindamycin/Lincomycin   . Zosyn [Piperacillin Sod-Tazobactam So]     Antimicrobials this admission: 1/10 zyvox 1/10 meropenem  Dose adjustments this admission:   Microbiology results: 1/10 blood x2 1/10 urine 1/10 resp 1/10 MRSA PCR  Thank you for allowing pharmacy to be a part of this patient's care.  Hildred Laser, Pharm D 05/27/2016 6:31 AM

## 2016-05-27 NOTE — Progress Notes (Signed)
CRITICAL VALUE ALERT  Critical value received:  Lactic Acid  Date of notification:  05/27/16  Time of notification:  0600  Critical value read back:Yes.    Nurse who received alert:  Donald Siva RN  MD notified (1st page):  Georgann Housekeeper  Time of first page:  0615  MD notified (2nd page):  Time of second page:  Responding MD:  Georgann Housekeeper  Time MD responded:  [phone]

## 2016-05-27 NOTE — Procedures (Signed)
Intubation Procedure Note Tracy Barker [id number] 1945-08-07  Procedure: Intubation Indications: Respiratory insufficiency  Procedure Details Consent: Unable to obtain consent because of emergent medical necessity. Time Out: Verified patient identification, verified procedure, site/side was marked, verified correct patient position, special equipment/implants available, medications/allergies/relevent history reviewed, required imaging and test results available.  Performed  Maximum sterile technique was used including antiseptics, gloves, hand hygiene and mask.  MAC    Evaluation Hemodynamic Status: BP stable throughout; O2 sats: stable throughout Patient's Current Condition: stable Complications: No apparent complications Patient did tolerate procedure well. Chest X-ray ordered to verify placement.  CXR: pending.   Tracy Barker 05/27/2016

## 2016-05-27 NOTE — Care Management Note (Signed)
Case Management Note  Patient Details  Name: Tracy Barker MRN: [id number] Date of Birth: [birth year]  Subjective/Objective:   Pt transferred from [facility] in AKI and shock - pt was intubated on arrival.                  Action/Plan:  Pt is intubated and family not available.  CM will continue to follow for discharge needs   Expected Discharge Date:                  Expected Discharge Plan:     In-House Referral:     Discharge planning Services  CM Consult  Post Acute Care Choice:    Choice offered to:     DME Arranged:    DME Agency:     HH Arranged:    HH Agency:     Status of Service:  In process, will continue to follow  If discussed at Long Length of Stay Meetings, dates discussed:    Additional Comments:  Maryclare Labrador, RN 05/27/2016, 3:45 PM

## 2016-05-27 NOTE — H&P (Signed)
PULMONARY / CRITICAL CARE MEDICINE   Name: Tracy Barker MRN: [id number] DOB: 1945-12-26    ADMISSION DATE:  05/27/2016 CONSULTATION DATE:  05/27/2016  REFERRING MD:  [facility] ED[facility]  CHIEF COMPLAINT:  Weakness  HISTORY OF PRESENT ILLNESS:   71 year old female with PMH significant for DM, HTN, and CLL. Reportedly she is a "frequent flyer" to he ED from, which she was sent, typically for falls. She has recently been seen there for cough x 1 month treated with Zpak, and fall with no apparent injury. 1/9 she presented for weakness x about 24 hours. Also had several episodes of vomiting. EMS was called and upon their arrival she was alert and oriented. In ED she was noted to additionally have some L sided weakness.  Laboratory evaluation significant for hyperkalemia, renal failure, and leukocytosis. She was given broad spectrum antibiotics (mero and linezolid) and temporizing therapies for hyperkalemia were used. Despite these therapies she became progressively more lethargic and developed shock. She was started on levophed with escalating doses, and eventually was started on epinephrine as well. No clear etiology determined, however, septic shock was considered most likely. She was transferred to [facility] for ICU admission.  PAST MEDICAL HISTORY :  She  has no past medical history on file.  PAST SURGICAL HISTORY: She  has no past surgical history on file.  Allergies not on file  No current facility-administered medications on file prior to encounter.    No current outpatient prescriptions on file prior to encounter.    FAMILY HISTORY:  Her has no family status information on file.    SOCIAL HISTORY: She    REVIEW OF SYSTEMS:   Unable as patient is encephalopathic  SUBJECTIVE:    VITAL SIGNS: BP (!) 98/46   Pulse (!) 110   Resp (!) 26   SpO2 99%   HEMODYNAMICS:    VENTILATOR SETTINGS: FiO2 (%):  [100 %] 100 %  INTAKE / OUTPUT: No intake/output data  recorded.  PHYSICAL EXAMINATION: General:  Obese female in respiratory distress Neuro: responds to verbal, one word answers. Encephalopathic.  HEENT:  /AT, PERRL, no JVD Cardiovascular:  RRR, no MRG Lungs:  Diminished bases Abdomen:  Soft, generalized tenderness, non-distended Musculoskeletal:  No acute deformity or ROM limitation. Remote toe amputation Skin: Grossly intact, cyanotic toes (uncertain chronicity)  LABS:  BMET No results for input(s): NA, K, CL, CO2, BUN, CREATININE, GLUCOSE in the last 168 hours.  Electrolytes No results for input(s): CALCIUM, MG, PHOS in the last 168 hours.  CBC No results for input(s): WBC, HGB, HCT, PLT in the last 168 hours.  Coag's No results for input(s): APTT, INR in the last 168 hours.  Sepsis Markers No results for input(s): LATICACIDVEN, PROCALCITON, O2SATVEN in the last 168 hours.  ABG No results for input(s): PHART, PCO2ART, PO2ART in the last 168 hours.  Liver Enzymes No results for input(s): AST, ALT, ALKPHOS, BILITOT, ALBUMIN in the last 168 hours.  Cardiac Enzymes No results for input(s): TROPONINI, PROBNP in the last 168 hours.  Glucose No results for input(s): GLUCAP in the last 168 hours.  Imaging No results found.   STUDIES:  CT head 1/9> non-acute CT abdomen pelvis 1/9 >  CULTURES: Blood 1/10 > Urine 1/10 > Tracheal aspirate 1/10 > Flu 1/10 > RVP 1/10 >  ANTIBIOTICS: Meropenem 1/9 > Linezolid 1/9 >  SIGNIFICANT EVENTS: 1/10 transferred to [facility], intubated  LINES/TUBES: ETT 1/10 >>> R fem CVL 1/10 >>>  DISCUSSION:  ASSESSMENT / PLAN:  PULMONARY A: Acute hypoxemic respiratory failure  P:   Stat intubation CXR and ABG VAP prevention bundle Daily SBT  CARDIOVASCULAR A:  Shock, septic vs hypovolemic Troponin elevation, no EKG ischemic changes  P:  Telemetry monitoring MAP goal 72mmHg Levophed and epinephrine infusions for MAP goal Add vaso Echocardiogram Trend  troponin Consult cardiology in AM  RENAL A:   AKI Hyperkalemia AG metabolic acidosis Lactic + renal failure  P:   CMP now Extended lytes Hydrate Continue HCO3 infusion  GASTROINTESTINAL A:   Vomiting Abd pain  P:   CT abd pelvis NPO Protonix IV  HEMATOLOGIC A:   CLL (Blast crisis?)  P:  Follow CBC Heparin for VTE ppx  INFECTIOUS A:   SIRS - Concern sepsis although source unclear Leukocytosis  P:   Continue broad ABX started at OSH as above Cultures as above  ENDOCRINE A:   DM P:   Holding januvia, metformin, levemir CBG monitoring and SSI Stress dose steroids  NEUROLOGIC A:   Acute metabolic encephalopathy Likely CVA  P:   RASS goal: 0 to -1 PRN sedation MRI brain Consult neuro in AM   FAMILY  - Updates: no family contact information available  - Inter-disciplinary family meet or Palliative Care meeting due by:  1/17   Georgann Housekeeper, AGACNP-BC [place] [facility] Pager [phone] or [phone]  05/27/2016 6:12 AM  Attending Note:  71 year old female with history of CLL and poorly controlled diabetes.  Unsure about treatment for her CLL but following in [facility] supposedly.  Unsure about details of treatment.  Upon arrival, patient was tachypneic, clearly not protecting her airway.  Hypotensive to the 60's on norepi and epi drips infused through IOs with no IV access being transferred by EMS to [facility].  On exam, not protecting airway and severely hypotensive with diffuse crackles in all lung fields.  Femoral TLC was placed (did not want to cover face up while in respiratory failure) that will need an HD catheter and TLC placed in neck for dialysis access (likely) and CVP monitoring.  Supposedly outside hospital called neurology and cardiology here but no documentation of that was presented.  Very little paper work present with patient however.  Discussed with PCCM-NP and Elink-MD.  Evidently was seen by Dr. Simmie Davies in outside  hospital.  Patient was promptly intubated.  Pan culture.  Start merem/zyvox.  Will need to call nephrology, neurology and oncology this AM (will have rounding team make calls given the fact that services switch at 7 AM).  Stress dose steroids.  Continue epi/norepi for BP support.  D/C I/O.  Continue bicarb drip.  F/U on BMET.  May need to start kayexalate but rest of labs are pending.  The patient is critically ill with multiple organ systems failure and requires high complexity decision making for assessment and support, frequent evaluation and titration of therapies, application of advanced monitoring technologies and extensive interpretation of multiple databases.   Critical Care Time devoted to patient care services described in this note is  90  Minutes. This time reflects time of care of this signee Dr Jennet Maduro. This critical care time does not reflect procedure time, or teaching time or supervisory time of PA/NP/Med student/Med Resident etc but could involve care discussion time.  Rush Farmer, M.D. [facility] Pulmonary/Critical Care Medicine. Pager: [phone]. After hours pager: [phone].

## 2016-05-27 NOTE — Progress Notes (Signed)
Pharmacy Antibiotic Note  Tracy Barker is a 71 y.o. female admitted on 05/27/2016 with sepsis.  Pharmacy has been consulted for Zyvox and meropenem dosing.  Patient's SCr is elevated at 6.28 and she is to start CRRT.  Noted she received Merrem 1gm IV around 0730 today.   Plan: - Change Merrem to 1gm IV Q12H, start tonight - Continue Zyvox 600mg  IV Q12H - Monitor CRRT interruptions, clinical progress   Weight: 188 lb 11.4 oz (85.6 kg)  Temp (24hrs), Avg:98.7 F (37.1 C), Min:98.3 F (36.8 C), Max:99 F (37.2 C)   Recent Labs Lab 05/27/16 0520 05/27/16 0749 05/27/16 1103  WBC 33.7*  --  30.9*  CREATININE 6.29*  --  6.28*  LATICACIDVEN 8.2* 10.8*  --     CrCl cannot be calculated (Unknown ideal weight.).    Allergies  Allergen Reactions  . Amlodipine   . Clindamycin/Lincomycin   . Zosyn [Piperacillin Sod-Tazobactam So]     Antimicrobials this admission: 1/10 zyvox 1/10 meropenem  Dose adjustments this admission: N/A  Microbiology results: 1/10 blood x2 1/10 urine 1/10 resp 1/10 MRSA PCR   Tracy Barker, PharmD, BCPS Pager:  [phone] 05/27/2016, 4:43 PM

## 2016-05-27 NOTE — Progress Notes (Signed)
PULMONARY / CRITICAL CARE MEDICINE   Name: Tracy Barker MRN: [id number] DOB: Jun 25, 1945    ADMISSION DATE:  05/27/2016 CONSULTATION DATE:  05/27/2016  REFERRING MD:  [facility]  CHIEF COMPLAINT:  MODS/shock SUBJECTIVE:  Requiring high dose pressors. Left side is weak VITAL SIGNS: BP (!) 116/56   Pulse (!) 110   Temp 98.3 F (36.8 C) (Oral)   Resp (!) 32   Wt 188 lb 11.4 oz (85.6 kg)   SpO2 99%   HEMODYNAMICS:    VENTILATOR SETTINGS: Vent Mode: PRVC FiO2 (%):  [50 %-100 %] 50 % Set Rate:  [26 bmp-35 bmp] 35 bmp Vt Set:  [500 mL] 500 mL PEEP:  [5 cmH20] 5 cmH20 Plateau Pressure:  [18 cmH20] 18 cmH20  INTAKE / OUTPUT: I/O last 3 completed shifts: In: 588.7 [I.V.:588.7] Out: -   PHYSICAL EXAMINATION: General:  Obese female in respiratory distress Neuro: eyes open, rr in 30s, moves right side easily but left weak HEENT:  [place]/AT, PERRL, no JVD, orally intubated  Cardiovascular:  RRR, no MRG Lungs:  Diminished bases + increased rr  Abdomen:  Soft, generalized tenderness, non-distended Musculoskeletal:  No acute deformity or ROM limitation. Remote toe amputation Skin: Grossly intact, cyanotic toes (uncertain chronicity)  LABS:  BMET  Recent Labs Lab 05/27/16 0520  NA 137  K 5.3*  CL 100*  CO2 10*  BUN 109*  CREATININE 6.29*  GLUCOSE 283*    Electrolytes  Recent Labs Lab 05/27/16 0520  CALCIUM 6.6*  MG 1.2*  PHOS 9.2*    CBC  Recent Labs Lab 05/27/16 0520  WBC 33.7*  HGB 11.2*  HCT 35.3*  PLT 212    Coag's  Recent Labs Lab 05/27/16 0520  INR 1.50    Sepsis Markers  Recent Labs Lab 05/27/16 0520  LATICACIDVEN 8.2*    ABG  Recent Labs Lab 05/27/16 0550  PHART 7.188*  PCO2ART 33.6  PO2ART 331*    Liver Enzymes  Recent Labs Lab 05/27/16 0520  AST 57*  ALT 39  ALKPHOS 164*  BILITOT 0.9  ALBUMIN 1.8*    Cardiac Enzymes  Recent Labs Lab 05/27/16 0520  TROPONINI 0.29*    Glucose  Recent  Labs Lab 05/27/16 0615 05/27/16 0827 05/27/16 0851 05/27/16 0953  GLUCAP 287* 303* 303* 295*    Imaging Dg Chest Port 1 View  Result Date: 05/27/2016 CLINICAL DATA:  Difficulty intubation.  Central line placement. EXAM: PORTABLE CHEST 1 VIEW COMPARISON:  None. FINDINGS: Cardiac enlargement without vascular congestion. Right hilum is prominent. This could be due to central pulmonary artery dilatation nor 2 lymphadenopathy. Consider CT for further evaluation. Vague nodular opacities in the mid lungs measuring less than 1 cm. These could also be evaluated at CT. No focal consolidation or airspace disease. No blunting of costophrenic angles. No pneumothorax. Tortuous aorta. Endotracheal tube tip measures 2.2 cm above the carina. IMPRESSION: Endotracheal tube tip measures 2.2 cm above the carina. Cardiac enlargement. Nonspecific prominence of the right hilum with small nodular opacities in both lungs are indeterminate. CT recommended for further evaluation. Electronically Signed   By: Lucienne Capers M.D.   On: 05/27/2016 05:41   Dg Abd Portable 1v  Result Date: 05/27/2016 CLINICAL DATA:  Orogastric tube placement EXAM: PORTABLE ABDOMEN - 1 VIEW COMPARISON:  None. FINDINGS: Orogastric tube tip and side port are in the stomach. There is a catheter on the right placed in the right femoral region with tip at the expected level of the right common  iliac vein. There is no bowel dilatation or air-fluid levels suggesting bowel obstruction. No free air. 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 IMPRESSION: Orogastric tube tip and side port in stomach. Femoral catheter on the right with tip in the expected location of the right common iliac vein. Bowel gas pattern unremarkable. No bowel obstruction or free air evident. Electronically Signed   By: Lowella Grip III M.D.   On: 05/27/2016 08:39     STUDIES:  CT head 1/9> non-acute CT abdomen pelvis 1/9 >  CULTURES: Blood 1/10 > Urine 1/10  > Tracheal aspirate 1/10 > Flu 1/10 > RVP 1/10 >  ANTIBIOTICS: Meropenem 1/9 > Linezolid 1/9 >  SIGNIFICANT EVENTS: 1/10 transferred to [facility], intubated  LINES/TUBES: ETT 1/10 >>> R fem CVL 1/10 >>>  DISCUSSION: H/o CLL, poorly controlled DM, admitted w/ Refractory shock/MODS as well as left sided weakness. Presume sepsis ? Source. Had cough X 3 weeks, and possibly evolving right sided infiltrate. But also nausea and vomiting. Working dx her is sepsis +/- new stroke. Sources abd/pelvis (wonder about bowel ischemia), evolving PNA, or mult diabetic wounds. Has been pan cultured, broad spec abx started, CT head neg for CVA. Currently in refractory shock/MODS w/ profound acidosis and worsening renal failure     Imaging to include head, abd and chest.  Will need emergent HD Trop elevation likely r/t demand ischemia.  Family on way, not sure that she will survive.   ASSESSMENT / PLAN:  NEUROLOGIC A:   Likely CVA: CT brain at OSH negative for ICH/bleed w. New left sided weakness and initial NIHSS score 8 Acute metabolic encephalopathy P:   RASS goal: 0 to -1 PRN sedation Repeat CT head now (portable if able d/t shock state) Out side of window for endovascular intervention Will ask neuro to see once we have more data to go with  PULMONARY A: Acute hypoxemic respiratory failure in setting of profound acidosis and altered mental status  P:   Full vent support F/u CXR F/u sputum culture  PAD protocol   CARDIOVASCULAR A:  Shock, septic/MODS-->source is unclear but favoring sepsis also consider acidemia and ischemic gut  Troponin elevation, no EKG ischemic changes  P:  Telemetry monitoring MAP goal 27mmHg Levophed and epinephrine infusions for MAP goal Added vaso Place flow- track  Echocardiogram pending  Trend troponin Consult cardiology  RENAL A:   AKI Hyperkalemia AG metabolic acidosis Lactic + renal failure ->lactic acid not clearing.   P:   Continue  HCO3 infusion Repeat  GASTROINTESTINAL A:   Vomiting Abd pain  P:   CT abd pelvis-->pending d/t shock state  NPO Protonix IV  HEMATOLOGIC A:   CLL (Blast crisis?) vs sepsis  P:  Follow CBC Heparin for VTE ppx  INFECTIOUS A:   SIRS - Concern sepsis although source unclear ? abd Leukocytosis  P:   Continue broad ABX started at OSH as above Cultures as above  ENDOCRINE A:   DM P:   Holding januvia, metformin, levemir CBG monitoring insulin gtt Ck BHA  Stress dose steroids    FAMILY  - Updates: no family contact information available  - Inter-disciplinary family meet or Palliative Care meeting due by:  1/17  My ccm time 45 minutes  [name] ACNP-BC [place] Pager # [phone] OR # [phone] if no answer   05/27/2016 10:08 AM

## 2016-05-27 NOTE — Progress Notes (Signed)
[facility]  Echo: normal LVEF, RV dilated with increased RVSP Doubt PE given clinical context but will check lower extremity doppler ultrasound  Roselie Awkward, MD [facility] Pager: [phone] Cell: [phone] After 3pm or if no response, call [phone]

## 2016-05-27 NOTE — Procedures (Signed)
Hemodialysis Catheter Insertion Procedure Note Tracy Barker [id number] [birth year]  Procedure: Insertion of Hemodialysis Catheter Indications: CRRT  Procedure Details Consent: Risks of procedure as well as the alternatives and risks of each were explained to the (patient/caregiver).  Consent for procedure obtained. Time Out: Verified patient identification, verified procedure, site/side was marked, verified correct patient position, special equipment/implants available, medications/allergies/relevent history reviewed, required imaging and test results available.  Performed  Maximum sterile technique was used including antiseptics, cap, gloves, gown, hand hygiene, mask and sheet. Skin prep: Chlorhexidine; local anesthetic administered A antimicrobial bonded/coated triple lumen catheter was placed in the left internal jugular vein using the Seldinger technique.  Evaluation Blood flow good Complications: No apparent complications Patient did tolerate procedure well. Chest X-ray ordered to verify placement.  CXR: pending.  Procedure performed under direct ultrasound guidance for real time vessel cannulation.      Montey Hora, [facility] Pgr: [phone]  or [phone] 05/27/2016, 5:00 PM

## 2016-05-28 ENCOUNTER — Inpatient Hospital Stay (HOSPITAL_COMMUNITY): Payer: Medicare Other

## 2016-05-28 DIAGNOSIS — A4101 Sepsis due to Methicillin susceptible Staphylococcus aureus: Secondary | ICD-10-CM | POA: Diagnosis present

## 2016-05-28 DIAGNOSIS — E11621 Type 2 diabetes mellitus with foot ulcer: Secondary | ICD-10-CM

## 2016-05-28 DIAGNOSIS — R296 Repeated falls: Secondary | ICD-10-CM

## 2016-05-28 DIAGNOSIS — Z881 Allergy status to other antibiotic agents status: Secondary | ICD-10-CM

## 2016-05-28 DIAGNOSIS — R531 Weakness: Secondary | ICD-10-CM

## 2016-05-28 DIAGNOSIS — Z8619 Personal history of other infectious and parasitic diseases: Secondary | ICD-10-CM

## 2016-05-28 DIAGNOSIS — Z9181 History of falling: Secondary | ICD-10-CM

## 2016-05-28 DIAGNOSIS — M7989 Other specified soft tissue disorders: Secondary | ICD-10-CM

## 2016-05-28 DIAGNOSIS — Z794 Long term (current) use of insulin: Secondary | ICD-10-CM

## 2016-05-28 DIAGNOSIS — Z9911 Dependence on respirator [ventilator] status: Secondary | ICD-10-CM

## 2016-05-28 DIAGNOSIS — L97521 Non-pressure chronic ulcer of other part of left foot limited to breakdown of skin: Secondary | ICD-10-CM | POA: Diagnosis present

## 2016-05-28 DIAGNOSIS — J189 Pneumonia, unspecified organism: Secondary | ICD-10-CM

## 2016-05-28 DIAGNOSIS — Z89422 Acquired absence of other left toe(s): Secondary | ICD-10-CM

## 2016-05-28 DIAGNOSIS — Z888 Allergy status to other drugs, medicaments and biological substances status: Secondary | ICD-10-CM

## 2016-05-28 DIAGNOSIS — Z880 Allergy status to penicillin: Secondary | ICD-10-CM

## 2016-05-28 DIAGNOSIS — J984 Other disorders of lung: Secondary | ICD-10-CM

## 2016-05-28 DIAGNOSIS — L97529 Non-pressure chronic ulcer of other part of left foot with unspecified severity: Secondary | ICD-10-CM

## 2016-05-28 LAB — BASIC METABOLIC PANEL
ANION GAP: 22 — AB (ref 5–15)
ANION GAP: 20 — AB (ref 5–15)
ANION GAP: 14 (ref 5–15)
BUN: 78 mg/dL — AB (ref 6–20)
BUN: 64 mg/dL — ABNORMAL HIGH (ref 6–20)
BUN: 47 mg/dL — ABNORMAL HIGH (ref 6–20)
CALCIUM: 6.2 mg/dL — AB (ref 8.9–10.3)
CALCIUM: 6.7 mg/dL — AB (ref 8.9–10.3)
CHLORIDE: 93 mmol/L — AB (ref 101–111)
CO2: 23 mmol/L (ref 22–32)
CO2: 28 mmol/L (ref 22–32)
CO2: 25 mmol/L (ref 22–32)
CREATININE: 2.51 mg/dL — AB (ref 0.44–1.00)
Calcium: 6 mg/dL — CL (ref 8.9–10.3)
Chloride: 91 mmol/L — ABNORMAL LOW (ref 101–111)
Chloride: 99 mmol/L — ABNORMAL LOW (ref 101–111)
Creatinine, Ser: 3.58 mg/dL — ABNORMAL HIGH (ref 0.44–1.00)
Creatinine, Ser: 3.08 mg/dL — ABNORMAL HIGH (ref 0.44–1.00)
GFR calc Af Amer: 14 mL/min — ABNORMAL LOW (ref >60)
GFR calc non Af Amer: 14 mL/min — ABNORMAL LOW (ref >60)
GFR, EST AFRICAN AMERICAN: 17 mL/min — AB (ref >60)
GFR, EST AFRICAN AMERICAN: 21 mL/min — AB (ref >60)
GFR, EST NON AFRICAN AMERICAN: 12 mL/min — AB (ref >60)
GFR, EST NON AFRICAN AMERICAN: 18 mL/min — AB (ref >60)
GLUCOSE: 206 mg/dL — AB (ref 65–99)
GLUCOSE: 213 mg/dL — AB (ref 65–99)
Glucose, Bld: 232 mg/dL — ABNORMAL HIGH (ref 65–99)
POTASSIUM: 4.4 mmol/L (ref 3.5–5.1)
POTASSIUM: 4.2 mmol/L (ref 3.5–5.1)
Potassium: 4.4 mmol/L (ref 3.5–5.1)
Sodium: 138 mmol/L (ref 135–145)
Sodium: 139 mmol/L (ref 135–145)
Sodium: 138 mmol/L (ref 135–145)

## 2016-05-28 LAB — BLOOD CULTURE ID PANEL (REFLEXED)
ACINETOBACTER BAUMANNII: NOT DETECTED
CANDIDA ALBICANS: NOT DETECTED
CANDIDA KRUSEI: NOT DETECTED
CANDIDA PARAPSILOSIS: NOT DETECTED
Candida glabrata: NOT DETECTED
Candida tropicalis: NOT DETECTED
Carbapenem resistance: NOT DETECTED
ENTEROBACTER CLOACAE COMPLEX: NOT DETECTED
Enterobacteriaceae species: NOT DETECTED
Enterococcus species: NOT DETECTED
Escherichia coli: NOT DETECTED
Haemophilus influenzae: NOT DETECTED
KLEBSIELLA OXYTOCA: NOT DETECTED
Klebsiella pneumoniae: NOT DETECTED
Listeria monocytogenes: NOT DETECTED
Methicillin resistance: NOT DETECTED
NEISSERIA MENINGITIDIS: NOT DETECTED
PSEUDOMONAS AERUGINOSA: NOT DETECTED
Proteus species: NOT DETECTED
STAPHYLOCOCCUS AUREUS BCID: DETECTED — AB
STREPTOCOCCUS AGALACTIAE: NOT DETECTED
STREPTOCOCCUS PNEUMONIAE: NOT DETECTED
STREPTOCOCCUS SPECIES: NOT DETECTED
Serratia marcescens: NOT DETECTED
Staphylococcus species: DETECTED — AB
Streptococcus pyogenes: NOT DETECTED
VANCOMYCIN RESISTANCE: NOT DETECTED

## 2016-05-28 LAB — CBC
HCT: 32.6 % — ABNORMAL LOW (ref 36.0–46.0)
HEMATOCRIT: 30.4 % — AB (ref 36.0–46.0)
HEMATOCRIT: 30.6 % — AB (ref 36.0–46.0)
HEMOGLOBIN: 10.8 g/dL — AB (ref 12.0–15.0)
HEMOGLOBIN: 10.7 g/dL — AB (ref 12.0–15.0)
HEMOGLOBIN: 11.1 g/dL — AB (ref 12.0–15.0)
MCH: 27.7 pg (ref 26.0–34.0)
MCH: 27.1 pg (ref 26.0–34.0)
MCH: 27.1 pg (ref 26.0–34.0)
MCHC: 35.5 g/dL (ref 30.0–36.0)
MCHC: 35 g/dL (ref 30.0–36.0)
MCHC: 34 g/dL (ref 30.0–36.0)
MCV: 77.9 fL — AB (ref 78.0–100.0)
MCV: 77.5 fL — ABNORMAL LOW (ref 78.0–100.0)
MCV: 79.7 fL (ref 78.0–100.0)
Platelets: 119 10*3/uL — ABNORMAL LOW (ref 150–400)
Platelets: 124 10*3/uL — ABNORMAL LOW (ref 150–400)
Platelets: 117 10*3/uL — ABNORMAL LOW (ref 150–400)
RBC: 3.9 MIL/uL (ref 3.87–5.11)
RBC: 3.95 MIL/uL (ref 3.87–5.11)
RBC: 4.09 MIL/uL (ref 3.87–5.11)
RDW: 14.7 % (ref 11.5–15.5)
RDW: 14 % (ref 11.5–15.5)
RDW: 14.4 % (ref 11.5–15.5)
WBC: 24.2 10*3/uL — ABNORMAL HIGH (ref 4.0–10.5)
WBC: 27.1 10*3/uL — ABNORMAL HIGH (ref 4.0–10.5)
WBC: 35.1 10*3/uL — ABNORMAL HIGH (ref 4.0–10.5)

## 2016-05-28 LAB — GLUCOSE, CAPILLARY
Glucose-Capillary: 173 mg/dL — ABNORMAL HIGH (ref 65–99)
Glucose-Capillary: 156 mg/dL — ABNORMAL HIGH (ref 65–99)
Glucose-Capillary: 169 mg/dL — ABNORMAL HIGH (ref 65–99)
Glucose-Capillary: 249 mg/dL — ABNORMAL HIGH (ref 65–99)

## 2016-05-28 LAB — HEMOGLOBIN A1C
Hgb A1c MFr Bld: 10.3 % — ABNORMAL HIGH (ref 4.8–5.6)
MEAN PLASMA GLUCOSE: 249 mg/dL

## 2016-05-28 LAB — CK
CK TOTAL: 1090 U/L — AB (ref 38–234)
Total CK: 1239 U/L — ABNORMAL HIGH (ref 38–234)

## 2016-05-28 LAB — RENAL FUNCTION PANEL
ANION GAP: 22 — AB (ref 5–15)
Albumin: 1.6 g/dL — ABNORMAL LOW (ref 3.5–5.0)
Albumin: 1.6 g/dL — ABNORMAL LOW (ref 3.5–5.0)
Anion gap: 17 — ABNORMAL HIGH (ref 5–15)
BUN: 77 mg/dL — AB (ref 6–20)
BUN: 55 mg/dL — AB (ref 6–20)
CHLORIDE: 92 mmol/L — AB (ref 101–111)
CHLORIDE: 97 mmol/L — AB (ref 101–111)
CO2: 24 mmol/L (ref 22–32)
CO2: 24 mmol/L (ref 22–32)
Calcium: 6.1 mg/dL — CL (ref 8.9–10.3)
Calcium: 6.9 mg/dL — ABNORMAL LOW (ref 8.9–10.3)
Creatinine, Ser: 3.6 mg/dL — ABNORMAL HIGH (ref 0.44–1.00)
Creatinine, Ser: 2.6 mg/dL — ABNORMAL HIGH (ref 0.44–1.00)
GFR calc Af Amer: 14 mL/min — ABNORMAL LOW (ref >60)
GFR calc Af Amer: 20 mL/min — ABNORMAL LOW (ref >60)
GFR calc non Af Amer: 12 mL/min — ABNORMAL LOW (ref >60)
GFR calc non Af Amer: 18 mL/min — ABNORMAL LOW (ref >60)
GLUCOSE: 207 mg/dL — AB (ref 65–99)
GLUCOSE: 239 mg/dL — AB (ref 65–99)
PHOSPHORUS: 4.5 mg/dL (ref 2.5–4.6)
POTASSIUM: 4.4 mmol/L (ref 3.5–5.1)
POTASSIUM: 4.6 mmol/L (ref 3.5–5.1)
Phosphorus: 5.6 mg/dL — ABNORMAL HIGH (ref 2.5–4.6)
Sodium: 138 mmol/L (ref 135–145)
Sodium: 138 mmol/L (ref 135–145)

## 2016-05-28 LAB — TROPONIN I
TROPONIN I: 0.35 ng/mL — AB (ref <0.03)
TROPONIN I: 0.32 ng/mL — AB (ref <0.03)
TROPONIN I: 0.22 ng/mL — AB (ref <0.03)

## 2016-05-28 LAB — PHOSPHORUS: Phosphorus: 4.5 mg/dL (ref 2.5–4.6)

## 2016-05-28 LAB — URINE CULTURE: Culture: 40000 — AB

## 2016-05-28 LAB — LACTATE DEHYDROGENASE: LDH: 696 U/L — ABNORMAL HIGH (ref 98–192)

## 2016-05-28 LAB — UREA NITROGEN, URINE: UREA NITROGEN UR: 150 mg/dL

## 2016-05-28 LAB — MAGNESIUM: Magnesium: 1.5 mg/dL — ABNORMAL LOW (ref 1.7–2.4)

## 2016-05-28 MED ORDER — SODIUM BICARBONATE 8.4 % IV SOLN: INTRAVENOUS | Status: DC

## 2016-05-28 MED ORDER — SODIUM CHLORIDE 0.9 % IV SOLN
1.0000 g | Freq: Once | INTRAVENOUS | Status: AC
  Administered 2016-05-28: 1 g via INTRAVENOUS
  Filled 2016-05-28: qty 10

## 2016-05-28 MED ORDER — MIDAZOLAM HCL 2 MG/2ML IJ SOLN
2.0000 mg | Freq: Once | INTRAMUSCULAR | Status: AC
  Administered 2016-05-28: 2 mg via INTRAVENOUS

## 2016-05-28 MED ORDER — AMIODARONE HCL IN DEXTROSE 360-4.14 MG/200ML-% IV SOLN
30.0000 mg/h | INTRAVENOUS | Status: DC
  Administered 2016-05-28 – 2016-05-30 (×4): 30 mg/h via INTRAVENOUS
  Filled 2016-05-28 (×13): qty 200

## 2016-05-28 MED ORDER — SODIUM CHLORIDE 0.9 % IV SOLN
2.0000 g | Freq: Once | INTRAVENOUS | Status: AC
  Administered 2016-05-28: 2 g via INTRAVENOUS
  Filled 2016-05-28: qty 20

## 2016-05-28 MED ORDER — INSULIN GLARGINE 100 UNIT/ML ~~LOC~~ SOLN
12.0000 [IU] | SUBCUTANEOUS | Status: DC
  Administered 2016-05-28: 12 [IU] via SUBCUTANEOUS
  Filled 2016-05-28: qty 0.12

## 2016-05-28 MED ORDER — AMIODARONE HCL IN DEXTROSE 360-4.14 MG/200ML-% IV SOLN
60.0000 mg/h | INTRAVENOUS | Status: AC
  Administered 2016-05-28: 60 mg/h via INTRAVENOUS
  Filled 2016-05-28: qty 200

## 2016-05-28 MED ORDER — LINEZOLID 600 MG/300ML IV SOLN
600.0000 mg | Freq: Two times a day (BID) | INTRAVENOUS | Status: DC
  Administered 2016-05-28 – 2016-05-31 (×6): 600 mg via INTRAVENOUS
  Filled 2016-05-28 (×6): qty 300

## 2016-05-28 MED ORDER — INSULIN ASPART 100 UNIT/ML ~~LOC~~ SOLN
0.0000 [IU] | SUBCUTANEOUS | Status: DC
  Administered 2016-05-28: 3 [IU] via SUBCUTANEOUS
  Administered 2016-05-28 (×2): 5 [IU] via SUBCUTANEOUS
  Administered 2016-05-29 (×2): 3 [IU] via SUBCUTANEOUS
  Administered 2016-05-29: 2 [IU] via SUBCUTANEOUS
  Administered 2016-05-30 (×3): 3 [IU] via SUBCUTANEOUS
  Administered 2016-05-30: 15 [IU] via SUBCUTANEOUS
  Administered 2016-05-30: 3 [IU] via SUBCUTANEOUS

## 2016-05-28 MED ORDER — VITAL HIGH PROTEIN PO LIQD
1000.0000 mL | ORAL | Status: DC
  Administered 2016-05-28: 1000 mL

## 2016-05-28 MED ORDER — PRISMASOL BGK 4/2.5 32-4-2.5 MEQ/L IV SOLN
INTRAVENOUS | Status: DC
  Administered 2016-05-28 – 2016-05-30 (×7): via INTRAVENOUS_CENTRAL
  Filled 2016-05-28 (×13): qty 5000

## 2016-05-28 MED ORDER — INSULIN ASPART 100 UNIT/ML ~~LOC~~ SOLN
3.0000 [IU] | SUBCUTANEOUS | Status: DC
  Administered 2016-05-28 – 2016-05-29 (×5): 3 [IU] via SUBCUTANEOUS

## 2016-05-28 NOTE — Progress Notes (Signed)
Patient ID: Tracy Barker, female   DOB: 17-Mar-1946, 71 y.o.   MRN: [id number] [facility] Progress Note   Assessment/ Plan:   1. AKI on CKD stage 3: Clinically suspected ATN associated with sepsis of unclear source at this time. Cavitary lung lesions and finding on CT scan compatible broadening our search for ANCA vasculitis and serologies have been ordered. No hydronephrosis noted on renal ultrasound. Continue CRRT on current prescription for regulation of multiple metabolic abnormalities and volume status. 2. Sepsis: Unclear source, one out of 2 blood cultures positive for Staphylococcus---? Source from her from skin ulcers. CT scan of the abdomen shows an enlarged gallbladder with an adrenal mass. Lymphadenopathy noted which may be consistent with her past history of CLL. No evidence of ischemic injury. 3. Mixed anion-gap and non-anion gap acidosis: From acute renal failure/metformin and recent GI losses. Acidosis corrected with hemodialysis 4. Hyperkalemia:  corrected with hemodialysis-CRRT 5. Abdominal pain:  no clear evidence of mesenteric ischemia-intra-abdominal adenopathy/dilated gallbladder noted.  Subjective:   No acute events overnight-tolerated CRRT without problems. Remains on low-dose pressors.    Objective:   BP (!) 97/53   Pulse 82   Temp 97.3 F (36.3 C) (Oral)   Resp (!) 31   Wt 87.7 kg (193 lb 5.5 oz)   SpO2 96%   Intake/Output Summary (Last 24 hours) at 05/28/16 0800 Last data filed at 05/28/16 0700  Gross per 24 hour  Intake          3296.03 ml  Output             2162 ml  Net          1134.03 ml   Weight change: 2.1 kg (4 lb 10.1 oz)  Physical Exam: CBS:[id number] resting in bed-awake and appears to respond to questions CVS: Pulse regular rhythm, normal S1 and S2 Resp: Clear to auscultation, no rales or rhonchi Abd: Soft, obese, right-sided upper and lower quadrant tenderness elicited Ext: 6-6+ lower extremity edema  Imaging: Ct Abdomen  Pelvis Wo Contrast  Result Date: 05/27/2016 CLINICAL DATA:  Cough and weakness EXAM: CT CHEST, ABDOMEN AND PELVIS WITHOUT CONTRAST TECHNIQUE: Multidetector CT imaging of the chest, abdomen and pelvis was performed following the standard protocol without IV contrast. COMPARISON:  None. FINDINGS: CT CHEST FINDINGS Cardiovascular: No evidence of aortic aneurysm. Little if any atherosclerotic calcifications. Mediastinum/Nodes: No abnormal mediastinal adenopathy. Small scattered mediastinal nodes are noted. Bilateral abnormal axillary adenopathy is present. 10 mm right axillary node on image 17. 11 mm left axillary node on image 14. 10 mm right level for neck lymph node on image 3. Similar left-sided node measures 10 mm on image 1. Endotracheal and NG tubes are in place. Lungs/Pleura: Small bilateral pleural effusions. Bibasilar dependent consolidation. Multiple bilateral ill-defined pulmonary nodules with cavitation are noted. The largest ill-defined mass is present in the left upper lobe measuring 1.8 cm on image 36. This particular lesion is indeterminate and somewhat patchy. There is a cavitary lesion in the lingula on image 51 measuring 1.6 cm. The largest lesion on the right is at the right apex and is cavitary measuring 1.4 cm on image 18. No pneumothorax. Musculoskeletal: No acute vertebral compression deformity. CT ABDOMEN PELVIS FINDINGS Hepatobiliary: No obvious focal liver mass. The left lobe is prominent. There is no evidence of nodularity. The gallbladder is markedly distended. It measures up to 6.4 cm in diameter. Pancreas: Atrophic. Spleen: Within normal limits. Adrenals/Urinary Tract: The kidneys are unremarkable bilaterally. The right adrenal gland  is within normal limits. There is a nonspecific 3.0 cm mass in the region of the left adrenal gland on image 63. This is nonspecific and may represent an adrenal mass or possibly a vascular abnormality such as varix or aneurysm. Stomach/Bowel: NG tube tip  is in the body of the stomach. There is no evidence of small-bowel obstruction. There is mild wall thickening of the ascending and descending colon. There is also apparent wall thickening of the sigmoid colon and rectum. Is difficult to determine if this is due to decompression or possibly volume overload. Diverticulosis of the descending colon is present. Vascular/Lymphatic: Innumerable relatively small lymph nodes are present in the gastrohepatic ligament. Several abnormal soft tissue densities are seen in the left periaortic region worrisome for abnormal adenopathy. Without contrast, it is difficult to determine if this represents venous structures or lymph nodes. If it is a lymph node, the largest conglomeration on image 76 measures 1.7 cm in short axis diameter. There is also a suspected left common iliac node measuring 1.4 cm on image 92. Smaller right iliac nodes are noted. Right femoral venous catheter is in place. Left common femoral arterial catheter is in place. No evidence of aortic aneurysm. Minimal atherosclerotic calcifications of the aorta. Reproductive: Uterus and adnexa are within normal limits. Other: No free-fluid. Musculoskeletal: No vertebral compression deformity. Multilevel degenerative disc disease is suspected. IMPRESSION: Bilateral indeterminate pulmonary opacities and cavitary lung masses as described. Differential diagnosis includes malignancy such as metastatic disease from squamous cell carcinoma. Also consider septic emboli and Wegner's granulomatosis. Small pleural effusions. Dependent bilateral lung consolidation in an aspiration pattern Bilateral axillary and neck adenopathy. There is also adenopathy in the abdomen and pelvis. Malignancy such as lymphoproliferative disorders cannot be excluded. Also consider opportunistic infection such as fungal or mycobacterium species. Dilated gallbladder.  Gallbladder hydrops is not excluded. Wall thickening of the colon. It is diffuse  suggesting volume overload. This is a nonspecific finding. 3.0 cm soft tissue mass in the region of the left adrenal gland. Differential diagnosis includes metastatic disease, varix, or aneurysm. Contrast-enhanced study is recommended. Electronically Signed   By: Marybelle Killings M.D.   On: 05/27/2016 15:10   Ct Head Wo Contrast  Result Date: 05/27/2016 CLINICAL DATA:  CLL (unclear treatment status), hypertension and diabetes was transferred to this hospital from the emergency room at [facility] for the management of severe sepsis after she presented there with cough and weakness. Reports noted of recent vomiting. Upon transfer here, was having increasing work of breathing/changes in mentation with inability to protect airway for which she was intubated. She was also started on pressors for shock and workup is underway for etiology of sepsis. There is concern of mesenteric/gut ischemia given resident dictation findings and elevated lactic acid level (of note, she was taking metformin). A review of her previous medical records shows that she has chronic kidney disease stage III and her baseline creatinine is about 1.2-1.3 EXAM: CT HEAD WITHOUT CONTRAST TECHNIQUE: Contiguous axial images were obtained from the base of the skull through the vertex without intravenous contrast. COMPARISON:  None. FINDINGS: Brain: No evidence of acute infarction, hemorrhage, hydrocephalus, extra-axial collection or mass lesion/mass effect. Mild atrophy. Vascular: No hyperdense vessel or unexpected calcification. Skull: Normal. Negative for fracture or focal lesion. Sinuses/Orbits: No acute finding. Other: None. IMPRESSION: 1. Negative for bleed or other acute intracranial process. Electronically Signed   By: Lucrezia Europe M.D.   On: 05/27/2016 14:57   Ct Chest Wo Contrast  Result  Date: 05/27/2016 CLINICAL DATA:  Cough and weakness EXAM: CT CHEST, ABDOMEN AND PELVIS WITHOUT CONTRAST TECHNIQUE: Multidetector CT imaging of the chest,  abdomen and pelvis was performed following the standard protocol without IV contrast. COMPARISON:  None. FINDINGS: CT CHEST FINDINGS Cardiovascular: No evidence of aortic aneurysm. Little if any atherosclerotic calcifications. Mediastinum/Nodes: No abnormal mediastinal adenopathy. Small scattered mediastinal nodes are noted. Bilateral abnormal axillary adenopathy is present. 10 mm right axillary node on image 17. 11 mm left axillary node on image 14. 10 mm right level for neck lymph node on image 3. Similar left-sided node measures 10 mm on image 1. Endotracheal and NG tubes are in place. Lungs/Pleura: Small bilateral pleural effusions. Bibasilar dependent consolidation. Multiple bilateral ill-defined pulmonary nodules with cavitation are noted. The largest ill-defined mass is present in the left upper lobe measuring 1.8 cm on image 36. This particular lesion is indeterminate and somewhat patchy. There is a cavitary lesion in the lingula on image 51 measuring 1.6 cm. The largest lesion on the right is at the right apex and is cavitary measuring 1.4 cm on image 18. No pneumothorax. Musculoskeletal: No acute vertebral compression deformity. CT ABDOMEN PELVIS FINDINGS Hepatobiliary: No obvious focal liver mass. The left lobe is prominent. There is no evidence of nodularity. The gallbladder is markedly distended. It measures up to 6.4 cm in diameter. Pancreas: Atrophic. Spleen: Within normal limits. Adrenals/Urinary Tract: The kidneys are unremarkable bilaterally. The right adrenal gland is within normal limits. There is a nonspecific 3.0 cm mass in the region of the left adrenal gland on image 63. This is nonspecific and may represent an adrenal mass or possibly a vascular abnormality such as varix or aneurysm. Stomach/Bowel: NG tube tip is in the body of the stomach. There is no evidence of small-bowel obstruction. There is mild wall thickening of the ascending and descending colon. There is also apparent wall  thickening of the sigmoid colon and rectum. Is difficult to determine if this is due to decompression or possibly volume overload. Diverticulosis of the descending colon is present. Vascular/Lymphatic: Innumerable relatively small lymph nodes are present in the gastrohepatic ligament. Several abnormal soft tissue densities are seen in the left periaortic region worrisome for abnormal adenopathy. Without contrast, it is difficult to determine if this represents venous structures or lymph nodes. If it is a lymph node, the largest conglomeration on image 76 measures 1.7 cm in short axis diameter. There is also a suspected left common iliac node measuring 1.4 cm on image 92. Smaller right iliac nodes are noted. Right femoral venous catheter is in place. Left common femoral arterial catheter is in place. No evidence of aortic aneurysm. Minimal atherosclerotic calcifications of the aorta. Reproductive: Uterus and adnexa are within normal limits. Other: No free-fluid. Musculoskeletal: No vertebral compression deformity. Multilevel degenerative disc disease is suspected. IMPRESSION: Bilateral indeterminate pulmonary opacities and cavitary lung masses as described. Differential diagnosis includes malignancy such as metastatic disease from squamous cell carcinoma. Also consider septic emboli and Wegner's granulomatosis. Small pleural effusions. Dependent bilateral lung consolidation in an aspiration pattern Bilateral axillary and neck adenopathy. There is also adenopathy in the abdomen and pelvis. Malignancy such as lymphoproliferative disorders cannot be excluded. Also consider opportunistic infection such as fungal or mycobacterium species. Dilated gallbladder.  Gallbladder hydrops is not excluded. Wall thickening of the colon. It is diffuse suggesting volume overload. This is a nonspecific finding. 3.0 cm soft tissue mass in the region of the left adrenal gland. Differential diagnosis includes metastatic disease, varix,  or  aneurysm. Contrast-enhanced study is recommended. Electronically Signed   By: Marybelle Killings M.D.   On: 05/27/2016 15:10   US Renal  Result Date: 05/28/2016 CLINICAL DATA:  Acute onset of renal insufficiency. Initial encounter. EXAM: RENAL / URINARY TRACT ULTRASOUND COMPLETE COMPARISON:  CT of the chest, abdomen and pelvis performed 05/27/2016 FINDINGS: Right Kidney: Length: 9.1 cm. Echogenicity within normal limits. Trace right-sided perinephric fluid is noted. No mass or hydronephrosis visualized. Left Kidney: Length: 10.7 cm. Echogenicity within normal limits. No mass or hydronephrosis visualized. Bladder: Decompressed, with a Foley catheter in place. Trace bilateral pleural effusions are noted. The gallbladder is significantly distended, similar in appearance to the recent CT. 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 IMPRESSION: 1. No evidence of hydronephrosis. 2. Trace bilateral pleural effusions. 3. Significant gallbladder distention again noted, similar in appearance to the recent CT. 4. 3 cm mass again noted at the region of the left adrenal gland. Adrenal protocol MRI or CT would be helpful for further evaluation, when and as deemed clinically appropriate. Electronically Signed   By: Garald Balding M.D.   On: 05/28/2016 02:41   Dg Chest Port 1 View  Result Date: 05/28/2016 CLINICAL DATA:  71 year old female with shock, renal failure. Intubated. Initial encounter. EXAM: PORTABLE CHEST 1 VIEW COMPARISON:  05/27/2016. FINDINGS: Portable AP semi upright view at 0444 hours. Stable endotracheal tube tip situated between the clavicles and carina. Enteric tube courses to the left abdomen, tip not included. Stable left IJ approach dual lumen dialysis type catheter. Stable lung volumes. Stable cardiac size and mediastinal contours. Streaky in confluent left lung base opacity is stable. No pneumothorax or pulmonary edema. No definite pleural effusion. No areas of worsening  ventilation. IMPRESSION: 1.  Stable lines and tubes. 2. Stable ventilation with left lower lobe atelectasis versus consolidation. Electronically Signed   By: Genevie Ann M.D.   On: 05/28/2016 06:50   Dg Chest Portable 1 View  Result Date: 05/27/2016 CLINICAL DATA:  Central line placement EXAM: PORTABLE CHEST 1 VIEW COMPARISON:  CT chest 05/27/2016, radiograph 05/27/2016 FINDINGS: Endotracheal tube tip is approximately 2.3 cm superior to the carina. Left-sided central venous catheter tip overlies the distal SVC. No left pneumothorax is visualized. Cardiomegaly and central vascular congestion persists. Bilateral nodular lung opacities as before. Slight increased atelectasis or infiltrate at the lung bases. Possible tiny left effusion. IMPRESSION: 1. Left-sided central venous catheter tip overlies the distal SVC. No pneumothorax 2. Cardiomegaly with central vascular congestion 3. Stable bilateral pulmonary nodular opacities. Slight increase in bibasilar atelectasis or infiltrate. Possible tiny left effusion. Electronically Signed   By: Donavan Foil M.D.   On: 05/27/2016 17:12   Dg Chest Port 1 View  Result Date: 05/27/2016 CLINICAL DATA:  Difficulty intubation.  Central line placement. EXAM: PORTABLE CHEST 1 VIEW COMPARISON:  None. FINDINGS: Cardiac enlargement without vascular congestion. Right hilum is prominent. This could be due to central pulmonary artery dilatation nor 2 lymphadenopathy. Consider CT for further evaluation. Vague nodular opacities in the mid lungs measuring less than 1 cm. These could also be evaluated at CT. No focal consolidation or airspace disease. No blunting of costophrenic angles. No pneumothorax. Tortuous aorta. Endotracheal tube tip measures 2.2 cm above the carina. IMPRESSION: Endotracheal tube tip measures 2.2 cm above the carina. Cardiac enlargement. Nonspecific prominence of the right hilum with small nodular opacities in both lungs are indeterminate. CT recommended for further  evaluation. Electronically Signed  By: Lucienne Capers M.D.   On: 05/27/2016 05:41   Dg Abd Portable 1v  Result Date: 05/27/2016 CLINICAL DATA:  Orogastric tube placement EXAM: PORTABLE ABDOMEN - 1 VIEW COMPARISON:  None. FINDINGS: Orogastric tube tip and side port are in the stomach. There is a catheter on the right placed in the right femoral region with tip at the expected level of the right common iliac vein. There is no bowel dilatation or air-fluid levels suggesting bowel obstruction. No free air. 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 IMPRESSION: Orogastric tube tip and side port in stomach. Femoral catheter on the right with tip in the expected location of the right common iliac vein. Bowel gas pattern unremarkable. No bowel obstruction or free air evident. Electronically Signed   By: Lowella Grip III M.D.   On: 05/27/2016 08:39    Labs: BMET  Recent Labs Lab 05/27/16 0520 05/27/16 1103 05/27/16 1740 05/28/16 0303 05/28/16 0321  NA 137 139 137 138 138  K 5.3* 4.5 4.6 4.4 4.4  CL 100* 98* 95* 93* 92*  CO2 10* 13* 21* 23 24  GLUCOSE 283* 262* 172* 206* 207*  BUN 109* 112* 109* 78* 77*  CREATININE 6.29* 6.28* 5.70* 3.58* 3.60*  CALCIUM 6.6* 6.4* 5.9* 6.0* 6.1*  PHOS 9.2*  --   --  4.5 4.5   CBC  Recent Labs Lab 05/27/16 0520 05/27/16 1103 05/27/16 1740 05/28/16 0303  WBC 33.7* 30.9* 21.9* 24.2*  NEUTROABS 15.2*  --   --   --   HGB 11.2* 11.4* 10.9* 10.8*  HCT 35.3* 35.0* 31.6* 30.4*  MCV 84.9 83.7 79.8 77.9*  PLT 212 164 125* 119*   Medications:    .  stroke: mapping our early stages of recovery book   Does not apply Once  . chlorhexidine gluconate (MEDLINE KIT)  15 mL Mouth Rinse BID  . heparin  5,000 Units Subcutaneous Q8H  . hydrocortisone sod succinate (SOLU-CORTEF) inj  50 mg Intravenous Q6H  . insulin aspart  1-3 Units Subcutaneous Q4H  . insulin glargine  10 Units Subcutaneous Q24H  . linezolid (ZYVOX) IV  600 mg Intravenous Q12H  . mouth  rinse  15 mL Mouth Rinse QID  . meropenem (MERREM) IV  1 g Intravenous Q12H  . pantoprazole (PROTONIX) IV  40 mg Intravenous QHS   Elmarie Shiley, MD 05/28/2016, 8:00 AM

## 2016-05-28 NOTE — Progress Notes (Signed)
Initial Nutrition Assessment  DOCUMENTATION CODES:   Obesity unspecified  INTERVENTION:    Initiate TF via OGT with Vital High Protein at goal rate of 50 ml/h (1200 ml per day) to provide 1200 kcals, 105 gm protein, 1003 ml free water daily.  NUTRITION DIAGNOSIS:   Inadequate oral intake related to inability to eat as evidenced by NPO status.  GOAL:   Provide needs based on ASPEN/SCCM guidelines  MONITOR:   Vent status, Labs, TF tolerance, Skin, I & O's  REASON FOR ASSESSMENT:   Rounds, Consult Enteral/tube feeding initiation and management  ASSESSMENT:   71 yo female with hx of CLL, poorly controlled DM, admitted w/ refractory shock/MODS as well as left sided weakness. Working dx is sepsis +/- new stroke.    Discussed patient in ICU rounds and with RN today. MD requests RD order TF. OGT in place. Nutrition-Focused physical exam completed. Findings are no fat depletion, no muscle depletion, and mild edema.  Patient is currently intubated on ventilator support Temp (24hrs), Avg:97.8 F (36.6 C), Min:97 F (36.1 C), Max:98.9 F (37.2 C)  Labs and medications reviewed.  Diet Order:  Diet NPO time specified  Skin:  Wound (see comment) (diabetic ulcer to left toes; non pressure related wound to coccyx)  Last BM:  1/10  Height:   Ht Readings from Last 1 Encounters:  05/28/16 5\' 2"  (1.575 m)   Weight:   Wt Readings from Last 1 Encounters:  05/28/16 193 lb 5.5 oz (87.7 kg)    Ideal Body Weight:  50 kg  BMI:  Body mass index is 35.36 kg/m.  Estimated Nutritional Needs:   Kcal:  [phone]  Protein:  >/= 100 gm  Fluid:  1.5 L  EDUCATION NEEDS:   No education needs identified at this time  Molli Barrows, [place], [place], [place] Pager [phone] After Hours Pager [phone]

## 2016-05-28 NOTE — Progress Notes (Signed)
SLP Cancellation Note  Patient Details Name: Dayeli Ragonese MRN: [id number] DOB: [birth year]   Cancelled treatment:       Reason Eval/Treat Not Completed: Patient not medically ready, remains intubated.   Germain Osgood 05/28/2016, 8:55 AM  Germain Osgood, M.A. CCC-SLP [phone]

## 2016-05-28 NOTE — Progress Notes (Signed)
Critical Calcium of 6.2. Reported to Velna Ochs MD at bedside.

## 2016-05-28 NOTE — Progress Notes (Signed)
eLink Physician-Brief Progress Note Patient Name: Tracy Barker DOB: Sep 30, 1945 MRN: [id number]   Date of Service  05/28/2016  HPI/Events of Note  Several loose stools and patient has a femoral CVL. Request for Flexiseal.  eICU Interventions  Will order: 1. Place Flexiseal.      Intervention Category Intermediate Interventions: Other:  Lysle Dingwall 05/28/2016, 6:13 PM

## 2016-05-28 NOTE — Consult Note (Signed)
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 On CCRT in the ICU. Intubated but daughter is in the room and reports that patient sat on a heating pad for several days.  Has been less ambulatory recently. Wound type: Arterial ulcerations 2nd and 3rd toe: each about 0.5cm x 0.5cm 100% eschar, slightly fluctuant centrally on the 2nd toe, but no drainage Deep tissue injury; Upper inner gluteal cleft; dark purple skin with non blanchable circumferential redness.8.5cm x 5.5cm x 0 Measurement: see above  Wound bed: see above  Drainage (amount, consistency, odor) none Periwound: intact  Dressing procedure/placement/frequency: Sport mattress in place for pressure redistribution.   Paint toe ulcers with betadine daily, allow to air dry Silicone foam dressing to the gluteal fold wound, assess at least Q shift for changes.  [place] Nurse team will follow along with you for weekly wound assessments.  Please notify me of any acute changes in the wounds or any new areas of concerns [place] MSN, [place], CNS [phone]

## 2016-05-28 NOTE — Progress Notes (Addendum)
PULMONARY / CRITICAL CARE MEDICINE   Name: Tracy Barker MRN: [id number] DOB: 04-18-1946    ADMISSION DATE:  05/27/2016 CONSULTATION DATE:  05/27/2016  REFERRING MD:  [facility]  CHIEF COMPLAINT:  MODS/shock  SUBJECTIVE:  On Levo & vasopressin No sedation  Alert, follows commands  Left side is weak On CRRT CT chest with bilateral cavitary lesions   VITAL SIGNS: BP (!) 97/53   Pulse 82   Temp 97.5 F (36.4 C) (Axillary)   Resp (!) 31   Wt 193 lb 5.5 oz (87.7 kg)   SpO2 96%   HEMODYNAMICS:    VENTILATOR SETTINGS: Vent Mode: PRVC FiO2 (%):  [40 %-50 %] 40 % Set Rate:  [35 bmp] 35 bmp Vt Set:  [500 mL] 500 mL PEEP:  [5 cmH20] 5 cmH20 Plateau Pressure:  [19 cmH20-28 cmH20] 19 cmH20  INTAKE / OUTPUT: I/O last 3 completed shifts: In: 3466.8 [I.V.:3166.8; IV Piggyback:300] Out: 172 [Urine:150; Other:22]  PHYSICAL EXAMINATION: General:  Obese female, on vent, NAD Neuro: eyes open, follows commands, moves right side easily but left weak HEENT:  [place]/AT, PERRL, no JVD, orally intubated  Cardiovascular:  RRR, no MRG Lungs:  Diminished bases + increased rr  Abdomen:  Soft, generalized tenderness, non-distended Musculoskeletal:  No acute deformity or ROM limitation. Remote toe amputation. Skin: Grossly intact, cyanotic toes (uncertain chronicity)  LABS:  BMET  Recent Labs Lab 05/27/16 1740 05/28/16 0303 05/28/16 0321  NA 137 138 138  K 4.6 4.4 4.4  CL 95* 93* 92*  CO2 21* 23 24  BUN 109* 78* 77*  CREATININE 5.70* 3.58* 3.60*  GLUCOSE 172* 206* 207*    Electrolytes  Recent Labs Lab 05/27/16 0520  05/27/16 1740 05/28/16 0303 05/28/16 0321  CALCIUM 6.6*  < > 5.9* 6.0* 6.1*  MG 1.2*  --   --  1.5*  --   PHOS 9.2*  --   --  4.5 4.5  < > = values in this interval not displayed.  CBC  Recent Labs Lab 05/27/16 1103 05/27/16 1740 05/28/16 0303  WBC 30.9* 21.9* 24.2*  HGB 11.4* 10.9* 10.8*  HCT 35.0* 31.6* 30.4*  PLT 164 125* 119*     Coag's  Recent Labs Lab 05/27/16 0520  INR 1.50    Sepsis Markers  Recent Labs Lab 05/27/16 0520 05/27/16 0749  LATICACIDVEN 8.2* 10.8*    ABG  Recent Labs Lab 05/27/16 0550 05/27/16 1108  PHART 7.188* 7.277*  PCO2ART 33.6 28.7*  PO2ART 331* 142.0*    Liver Enzymes  Recent Labs Lab 05/27/16 0520 05/28/16 0321  AST 57*  --   ALT 39  --   ALKPHOS 164*  --   BILITOT 0.9  --   ALBUMIN 1.8* 1.6*    Cardiac Enzymes  Recent Labs Lab 05/27/16 0520 05/27/16 0647 05/27/16 1739  TROPONINI 0.29* 0.34* 0.44*    Glucose  Recent Labs Lab 05/27/16 1713 05/27/16 1817 05/27/16 1919 05/27/16 2023 05/27/16 2341 05/28/16 0356  GLUCAP 141* 138* 135* 143* 121* 173*    Imaging Ct Abdomen Pelvis Wo Contrast  Result Date: 05/27/2016 CLINICAL DATA:  Cough and weakness EXAM: CT CHEST, ABDOMEN AND PELVIS WITHOUT CONTRAST TECHNIQUE: Multidetector CT imaging of the chest, abdomen and pelvis was performed following the standard protocol without IV contrast. COMPARISON:  None. FINDINGS: CT CHEST FINDINGS Cardiovascular: No evidence of aortic aneurysm. Little if any atherosclerotic calcifications. Mediastinum/Nodes: No abnormal mediastinal adenopathy. Small scattered mediastinal nodes are noted. Bilateral abnormal axillary adenopathy is present. 10  mm right axillary node on image 17. 11 mm left axillary node on image 14. 10 mm right level for neck lymph node on image 3. Similar left-sided node measures 10 mm on image 1. Endotracheal and NG tubes are in place. Lungs/Pleura: Small bilateral pleural effusions. Bibasilar dependent consolidation. Multiple bilateral ill-defined pulmonary nodules with cavitation are noted. The largest ill-defined mass is present in the left upper lobe measuring 1.8 cm on image 36. This particular lesion is indeterminate and somewhat patchy. There is a cavitary lesion in the lingula on image 51 measuring 1.6 cm. The largest lesion on the right is at  the right apex and is cavitary measuring 1.4 cm on image 18. No pneumothorax. Musculoskeletal: No acute vertebral compression deformity. CT ABDOMEN PELVIS FINDINGS Hepatobiliary: No obvious focal liver mass. The left lobe is prominent. There is no evidence of nodularity. The gallbladder is markedly distended. It measures up to 6.4 cm in diameter. Pancreas: Atrophic. Spleen: Within normal limits. Adrenals/Urinary Tract: The kidneys are unremarkable bilaterally. The right adrenal gland is within normal limits. There is a nonspecific 3.0 cm mass in the region of the left adrenal gland on image 63. This is nonspecific and may represent an adrenal mass or possibly a vascular abnormality such as varix or aneurysm. Stomach/Bowel: NG tube tip is in the body of the stomach. There is no evidence of small-bowel obstruction. There is mild wall thickening of the ascending and descending colon. There is also apparent wall thickening of the sigmoid colon and rectum. Is difficult to determine if this is due to decompression or possibly volume overload. Diverticulosis of the descending colon is present. Vascular/Lymphatic: Innumerable relatively small lymph nodes are present in the gastrohepatic ligament. Several abnormal soft tissue densities are seen in the left periaortic region worrisome for abnormal adenopathy. Without contrast, it is difficult to determine if this represents venous structures or lymph nodes. If it is a lymph node, the largest conglomeration on image 76 measures 1.7 cm in short axis diameter. There is also a suspected left common iliac node measuring 1.4 cm on image 92. Smaller right iliac nodes are noted. Right femoral venous catheter is in place. Left common femoral arterial catheter is in place. No evidence of aortic aneurysm. Minimal atherosclerotic calcifications of the aorta. Reproductive: Uterus and adnexa are within normal limits. Other: No free-fluid. Musculoskeletal: No vertebral compression  deformity. Multilevel degenerative disc disease is suspected. IMPRESSION: Bilateral indeterminate pulmonary opacities and cavitary lung masses as described. Differential diagnosis includes malignancy such as metastatic disease from squamous cell carcinoma. Also consider septic emboli and Wegner's granulomatosis. Small pleural effusions. Dependent bilateral lung consolidation in an aspiration pattern Bilateral axillary and neck adenopathy. There is also adenopathy in the abdomen and pelvis. Malignancy such as lymphoproliferative disorders cannot be excluded. Also consider opportunistic infection such as fungal or mycobacterium species. Dilated gallbladder.  Gallbladder hydrops is not excluded. Wall thickening of the colon. It is diffuse suggesting volume overload. This is a nonspecific finding. 3.0 cm soft tissue mass in the region of the left adrenal gland. Differential diagnosis includes metastatic disease, varix, or aneurysm. Contrast-enhanced study is recommended. Electronically Signed   By: Marybelle Killings M.D.   On: 05/27/2016 15:10   Ct Head Wo Contrast  Result Date: 05/27/2016 CLINICAL DATA:  CLL (unclear treatment status), hypertension and diabetes was transferred to this hospital from the emergency room at [facility] for the management of severe sepsis after she presented there with cough and weakness. Reports noted of recent vomiting.  Upon transfer here, was having increasing work of breathing/changes in mentation with inability to protect airway for which she was intubated. She was also started on pressors for shock and workup is underway for etiology of sepsis. There is concern of mesenteric/gut ischemia given resident dictation findings and elevated lactic acid level (of note, she was taking metformin). A review of her previous medical records shows that she has chronic kidney disease stage III and her baseline creatinine is about 1.2-1.3 EXAM: CT HEAD WITHOUT CONTRAST TECHNIQUE: Contiguous axial  images were obtained from the base of the skull through the vertex without intravenous contrast. COMPARISON:  None. FINDINGS: Brain: No evidence of acute infarction, hemorrhage, hydrocephalus, extra-axial collection or mass lesion/mass effect. Mild atrophy. Vascular: No hyperdense vessel or unexpected calcification. Skull: Normal. Negative for fracture or focal lesion. Sinuses/Orbits: No acute finding. Other: None. IMPRESSION: 1. Negative for bleed or other acute intracranial process. Electronically Signed   By: Lucrezia Europe M.D.   On: 05/27/2016 14:57   Ct Chest Wo Contrast  Result Date: 05/27/2016 CLINICAL DATA:  Cough and weakness EXAM: CT CHEST, ABDOMEN AND PELVIS WITHOUT CONTRAST TECHNIQUE: Multidetector CT imaging of the chest, abdomen and pelvis was performed following the standard protocol without IV contrast. COMPARISON:  None. FINDINGS: CT CHEST FINDINGS Cardiovascular: No evidence of aortic aneurysm. Little if any atherosclerotic calcifications. Mediastinum/Nodes: No abnormal mediastinal adenopathy. Small scattered mediastinal nodes are noted. Bilateral abnormal axillary adenopathy is present. 10 mm right axillary node on image 17. 11 mm left axillary node on image 14. 10 mm right level for neck lymph node on image 3. Similar left-sided node measures 10 mm on image 1. Endotracheal and NG tubes are in place. Lungs/Pleura: Small bilateral pleural effusions. Bibasilar dependent consolidation. Multiple bilateral ill-defined pulmonary nodules with cavitation are noted. The largest ill-defined mass is present in the left upper lobe measuring 1.8 cm on image 36. This particular lesion is indeterminate and somewhat patchy. There is a cavitary lesion in the lingula on image 51 measuring 1.6 cm. The largest lesion on the right is at the right apex and is cavitary measuring 1.4 cm on image 18. No pneumothorax. Musculoskeletal: No acute vertebral compression deformity. CT ABDOMEN PELVIS FINDINGS Hepatobiliary: No  obvious focal liver mass. The left lobe is prominent. There is no evidence of nodularity. The gallbladder is markedly distended. It measures up to 6.4 cm in diameter. Pancreas: Atrophic. Spleen: Within normal limits. Adrenals/Urinary Tract: The kidneys are unremarkable bilaterally. The right adrenal gland is within normal limits. There is a nonspecific 3.0 cm mass in the region of the left adrenal gland on image 63. This is nonspecific and may represent an adrenal mass or possibly a vascular abnormality such as varix or aneurysm. Stomach/Bowel: NG tube tip is in the body of the stomach. There is no evidence of small-bowel obstruction. There is mild wall thickening of the ascending and descending colon. There is also apparent wall thickening of the sigmoid colon and rectum. Is difficult to determine if this is due to decompression or possibly volume overload. Diverticulosis of the descending colon is present. Vascular/Lymphatic: Innumerable relatively small lymph nodes are present in the gastrohepatic ligament. Several abnormal soft tissue densities are seen in the left periaortic region worrisome for abnormal adenopathy. Without contrast, it is difficult to determine if this represents venous structures or lymph nodes. If it is a lymph node, the largest conglomeration on image 76 measures 1.7 cm in short axis diameter. There is also a suspected left common iliac  node measuring 1.4 cm on image 92. Smaller right iliac nodes are noted. Right femoral venous catheter is in place. Left common femoral arterial catheter is in place. No evidence of aortic aneurysm. Minimal atherosclerotic calcifications of the aorta. Reproductive: Uterus and adnexa are within normal limits. Other: No free-fluid. Musculoskeletal: No vertebral compression deformity. Multilevel degenerative disc disease is suspected. IMPRESSION: Bilateral indeterminate pulmonary opacities and cavitary lung masses as described. Differential diagnosis includes  malignancy such as metastatic disease from squamous cell carcinoma. Also consider septic emboli and Wegner's granulomatosis. Small pleural effusions. Dependent bilateral lung consolidation in an aspiration pattern Bilateral axillary and neck adenopathy. There is also adenopathy in the abdomen and pelvis. Malignancy such as lymphoproliferative disorders cannot be excluded. Also consider opportunistic infection such as fungal or mycobacterium species. Dilated gallbladder.  Gallbladder hydrops is not excluded. Wall thickening of the colon. It is diffuse suggesting volume overload. This is a nonspecific finding. 3.0 cm soft tissue mass in the region of the left adrenal gland. Differential diagnosis includes metastatic disease, varix, or aneurysm. Contrast-enhanced study is recommended. Electronically Signed   By: Marybelle Killings M.D.   On: 05/27/2016 15:10   US Renal  Result Date: 05/28/2016 CLINICAL DATA:  Acute onset of renal insufficiency. Initial encounter. EXAM: RENAL / URINARY TRACT ULTRASOUND COMPLETE COMPARISON:  CT of the chest, abdomen and pelvis performed 05/27/2016 FINDINGS: Right Kidney: Length: 9.1 cm. Echogenicity within normal limits. Trace right-sided perinephric fluid is noted. No mass or hydronephrosis visualized. Left Kidney: Length: 10.7 cm. Echogenicity within normal limits. No mass or hydronephrosis visualized. Bladder: Decompressed, with a Foley catheter in place. Trace bilateral pleural effusions are noted. The gallbladder is significantly distended, similar in appearance to the recent CT. 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 IMPRESSION: 1. No evidence of hydronephrosis. 2. Trace bilateral pleural effusions. 3. Significant gallbladder distention again noted, similar in appearance to the recent CT. 4. 3 cm mass again noted at the region of the left adrenal gland. Adrenal protocol MRI or CT would be helpful for further evaluation, when and as deemed clinically  appropriate. Electronically Signed   By: Garald Balding M.D.   On: 05/28/2016 02:41   Dg Chest Portable 1 View  Result Date: 05/27/2016 CLINICAL DATA:  Central line placement EXAM: PORTABLE CHEST 1 VIEW COMPARISON:  CT chest 05/27/2016, radiograph 05/27/2016 FINDINGS: Endotracheal tube tip is approximately 2.3 cm superior to the carina. Left-sided central venous catheter tip overlies the distal SVC. No left pneumothorax is visualized. Cardiomegaly and central vascular congestion persists. Bilateral nodular lung opacities as before. Slight increased atelectasis or infiltrate at the lung bases. Possible tiny left effusion. IMPRESSION: 1. Left-sided central venous catheter tip overlies the distal SVC. No pneumothorax 2. Cardiomegaly with central vascular congestion 3. Stable bilateral pulmonary nodular opacities. Slight increase in bibasilar atelectasis or infiltrate. Possible tiny left effusion. Electronically Signed   By: Donavan Foil M.D.   On: 05/27/2016 17:12   Dg Abd Portable 1v  Result Date: 05/27/2016 CLINICAL DATA:  Orogastric tube placement EXAM: PORTABLE ABDOMEN - 1 VIEW COMPARISON:  None. FINDINGS: Orogastric tube tip and side port are in the stomach. There is a catheter on the right placed in the right femoral region with tip at the expected level of the right common iliac vein. There is no bowel dilatation or air-fluid levels suggesting bowel obstruction. No free air. 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 IMPRESSION: Orogastric tube tip and side port in stomach.  Femoral catheter on the right with tip in the expected location of the right common iliac vein. Bowel gas pattern unremarkable. No bowel obstruction or free air evident. Electronically Signed   By: Lowella Grip III M.D.   On: 05/27/2016 08:39     STUDIES:  CT head 1/9> non-acute CT abdomen pelvis 1/9 > non-acute, gallbladder distention, colonic wall thickening CT chest 1/9 > bilateral opacities and cavitary lung masses   US Renal 1/11 > no evidence of hydronephrosis; 3 cm left adrenal mass   CULTURES: Blood 1/10 > 1 out of 2 with staph aureus (suseptabilities pending) Urine 1/10 > pending  Tracheal aspirate 1/10 > not collected Flu 1/10 > negative  RVP 1/10 > +Rhinovirus   ANTIBIOTICS: Meropenem 1/9 > Linezolid 1/9 >  SIGNIFICANT EVENTS: 1/10 transferred to [facility], intubated  LINES/TUBES: ETT 1/10 >>> R fem CVL 1/10 >>>  DISCUSSION: H/o CLL, poorly controlled DM, admitted w/ Refractory shock/MODS as well as left sided weakness. Presume sepsis ? Source. Had cough X 3 weeks, and possibly evolving right sided infiltrate. But also nausea and vomiting. Working dx her is sepsis +/- new stroke. Sources abd/pelvis (wonder about bowel ischemia), evolving PNA, or mult diabetic wounds. Has been pan cultured, broad spec abx started, CT head neg for CVA. Currently in refractory shock/MODS w/ profound acidosis and worsening renal failure     Imaging to include head, abd and chest.  Will need emergent HD Trop elevation likely r/t demand ischemia.  Family on way, not sure that she will survive.   ASSESSMENT / PLAN:  NEUROLOGIC A:   Likely CVA: CT brain at OSH negative for ICH/bleed w. New left sided weakness and initial NIHSS score 8 Acute metabolic encephalopathy - improved P:   RASS goal: 0 to -1 PRN sedation  Repeat CT head now (portable if able d/t shock state) - canceled? MRI brain canceled ? Out side of window for endovascular intervention Will ask neuro to see once we have more data to go with  PULMONARY A: Acute hypoxemic respiratory failure in setting of profound acidosis and altered mental status  CT chest with bilateral opacities and cavitary lung masses - ? Malignancy vs. Septic emboli vs vasculitic process  P:   Full vent support F/u CXR - stable F/u sputum culture - pending  RVP + for rhinovirus, neg flu  PAD protocol   ANCA, vasculitis serologies  ? Biopsy lung  lesions  CARDIOVASCULAR A:  Shock, septic/MODS-->source is unclear but favoring sepsis also consider acidemia and ischemic gut  Troponin elevation, no EKG ischemic changes  P:  Telemetry monitoring MAP goal 68mmHg Levophed and vasopressin infusions for MAP goal Place flow- track  Echocardiogram with hyperdynamic systolic function EF [id number]; grade 1 diastolic dysfunction; and no evidence of embolic source  Trend troponin Consult cardiology  RENAL A:   AKI; likely ATN due to sepsis + rhabdomyolysis?  Hyperkalemia AG metabolic acidosis Lactic + renal failure ->lactic acid not clearing.   P:   Renal following; appreciate recommendations CRRT  HCO3 infusion stopped Renal US without evidence of hydronephrosis  Follow labs, trend CK  ANCA, vasculitis serologies   GASTROINTESTINAL A:   Vomiting Abd pain ? Ischemic bowel   P:   CT abd pelvis--> colonic wall thickening, gallbladder distention, lymphadenopathy  NPO Protonix IV  HEMATOLOGIC A:   CLL (Blast crisis?) vs sepsis  P:  Follow CBC Blood smear -> leukocytosis with neutrophilia, consistent with hx of CLL Heparin for VTE ppx  INFECTIOUS  A:   SIRS - Concern sepsis although source unclear ? abd Leukocytosis  P:   Continue broad ABX started at OSH as above Cultures as above  ENDOCRINE A:   DM P:   Holding januvia, metformin, levemir CBG monitoring insulin gtt Ck BHA  Stress dose steroids   FAMILY  - Updates: no family contact information available  - Inter-disciplinary family meet or Palliative Care meeting due by:  1/17  My ccm time 37 minutes  Velna Ochs, M.D. Pager: [phone] 05/28/2016, 7:15 AM

## 2016-05-28 NOTE — Consult Note (Signed)
[facility] for Infectious Disease    Date of Admission:  05/27/2016           Day 2 linezolid       Reason for Consult: Automatic consultation for staph aureus bacteremia    Principal Problem:   Staphylococcus aureus bacteremia with sepsis [employer]) Active Problems:   Cavitary pneumonia   Toe ulcer ([place])   Shock ([place])   Abdominal pain   Acute renal failure (ARF) (HCC)   Increased anion gap metabolic acidosis   .  stroke: mapping our early stages of recovery book   Does not apply Once  . chlorhexidine gluconate (MEDLINE KIT)  15 mL Mouth Rinse BID  . heparin  5,000 Units Subcutaneous Q8H  . hydrocortisone sod succinate (SOLU-CORTEF) inj  50 mg Intravenous Q6H  . insulin aspart  1-3 Units Subcutaneous Q4H  . insulin glargine  10 Units Subcutaneous Q24H  . linezolid (ZYVOX) IV  600 mg Intravenous Q12H  . mouth rinse  15 mL Mouth Rinse QID  . meropenem (MERREM) IV  1 g Intravenous Q12H  . pantoprazole (PROTONIX) IV  40 mg Intravenous QHS    Recommendations: 1. Continue linezolid (I have discontinued meropenem) 2. Repeat blood cultures 3. X-ray of left foot   Assessment: Ms. Ventola has staph aureus bacteremia. I suspect that her ulcerated toes are the source. She may also have seeding of her lungs for septic pulmonary emboli causing pneumonia. No evidence of endocarditis was seen on transthoracic echocardiogram. She may need a transesophageal echocardiogram. I will repeat blood cultures and obtain an x-ray of her left foot.    HPI: Tracy Barker is a 71 y.o. female with a history of diabetes, hypertension, CLL and repeated falls. Her daughter knows that she got her left foot tangled in a chair and fell several weeks ago. She is not sure if she had any injury. Her mother got acutely ill and weak and was taken to the emergency department at [facility] in [place] 2 days ago. She was found to be hypotensive with acute kidney injury, hyperkalemia and  leukocytosis and transferred here for management of septic shock. Chest x-ray and CT showed cavitary nodules in both lungs. One of 2 admission blood cultures has grown MSSA. Her daughter tells me that she also had staph infection in her blood about 4 years ago and almost died.   Review of Systems: Review of Systems  Unable to perform ROS: Intubated    Past Medical History:  Diagnosis Date  . Diabetes mellitus ([place])    Type 2  . Leukemia ([place])    not specified    Social History  Substance Use Topics  . Smoking status: Not on file  . Smokeless tobacco: Not on file  . Alcohol use Not on file    No family history on file. Allergies  Allergen Reactions  . Amlodipine   . Clindamycin/Lincomycin   . Zosyn [Piperacillin Sod-Tazobactam So]     OBJECTIVE: Blood pressure (!) 86/63, pulse 94, temperature 97.3 F (36.3 C), temperature source Oral, resp. rate (!) 22, weight 193 lb 5.5 oz (87.7 kg), SpO2 99 %.  Physical Exam  Constitutional:  She is intubated but she is alert and able to follow commands.  Cardiovascular: Normal rate and regular rhythm.   No murmur heard. Pulmonary/Chest: Effort normal and breath sounds normal. She has no wheezes. She has no rales.  Abdominal: Soft. There is no tenderness.  Musculoskeletal:  Her left fourth toe (?) is surgically absent. The left second, third and fifth toes are swollen and slightly red with dry ulcers on the dorsum of each toe. She seems to indicate that this is a recent change.  Neurological: She is alert.  Skin: No rash noted.    Lab Results Lab Results  Component Value Date   WBC 27.1 (H) 05/28/2016   HGB 10.7 (L) 05/28/2016   HCT 30.6 (L) 05/28/2016   MCV 77.5 (L) 05/28/2016   PLT 124 (L) 05/28/2016    Lab Results  Component Value Date   CREATININE 3.60 (H) 05/28/2016   BUN 77 (H) 05/28/2016   NA 138 05/28/2016   K 4.4 05/28/2016   CL 92 (L) 05/28/2016   CO2 24 05/28/2016    Lab Results  Component Value Date    ALT 39 05/27/2016   AST 57 (H) 05/27/2016   ALKPHOS 164 (H) 05/27/2016   BILITOT 0.9 05/27/2016     Microbiology: Recent Results (from the past 240 hour(s))  MRSA PCR Screening     Status: None   Collection Time: 05/27/16  5:27 AM  Result Value Ref Range Status   MRSA by PCR NEGATIVE NEGATIVE Final    Comment:        The GeneXpert MRSA Assay (FDA approved for NASAL specimens only), is one component of a comprehensive MRSA colonization surveillance program. It is not intended to diagnose MRSA infection nor to guide or monitor treatment for MRSA infections.   Culture, Urine     Status: Abnormal   Collection Time: 05/27/16  8:04 AM  Result Value Ref Range Status   Specimen Description URINE, CATHETERIZED  Final   Special Requests NONE  Final   Culture 40,000 COLONIES/mL YEAST (A)  Final   Report Status 05/28/2016 FINAL  Final  Respiratory Panel by PCR     Status: Abnormal   Collection Time: 05/27/16  8:04 AM  Result Value Ref Range Status   Adenovirus NOT DETECTED NOT DETECTED Final   Coronavirus 229E NOT DETECTED NOT DETECTED Final   Coronavirus HKU1 NOT DETECTED NOT DETECTED Final   Coronavirus NL63 NOT DETECTED NOT DETECTED Final   Coronavirus OC43 NOT DETECTED NOT DETECTED Final   Metapneumovirus NOT DETECTED NOT DETECTED Final   Rhinovirus / Enterovirus DETECTED (A) NOT DETECTED Final   Influenza A NOT DETECTED NOT DETECTED Final   Influenza B NOT DETECTED NOT DETECTED Final   Parainfluenza Virus 1 NOT DETECTED NOT DETECTED Final   Parainfluenza Virus 2 NOT DETECTED NOT DETECTED Final   Parainfluenza Virus 3 NOT DETECTED NOT DETECTED Final   Parainfluenza Virus 4 NOT DETECTED NOT DETECTED Final   Respiratory Syncytial Virus NOT DETECTED NOT DETECTED Final   Bordetella pertussis NOT DETECTED NOT DETECTED Final   Chlamydophila pneumoniae NOT DETECTED NOT DETECTED Final   Mycoplasma pneumoniae NOT DETECTED NOT DETECTED Final  Culture, blood (Routine X 2) w Reflex to  ID Panel     Status: None (Preliminary result)   Collection Time: 05/27/16  9:05 AM  Result Value Ref Range Status   Specimen Description BLOOD RIGHT HAND  Final   Special Requests IN PEDIATRIC BOTTLE 1CC  Final   Culture  Setup Time   Final    GRAM POSITIVE COCCI IN CLUSTERS IN PEDIATRIC BOTTLE Organism ID to follow CRITICAL RESULT CALLED TO, READ BACK BY AND VERIFIED WITH: CARON AMEND,PHARMD @0252  05/28/16 MKELLY,MLT    Culture NO GROWTH <12 HOURS  Final   Report Status  PENDING  Incomplete  Blood Culture ID Panel (Reflexed)     Status: Abnormal   Collection Time: 05/27/16  9:05 AM  Result Value Ref Range Status   Enterococcus species NOT DETECTED NOT DETECTED Final   Vancomycin resistance NOT DETECTED NOT DETECTED Final   Listeria monocytogenes NOT DETECTED NOT DETECTED Final   Staphylococcus species DETECTED (A) NOT DETECTED Final    Comment: CRITICAL RESULT CALLED TO, READ BACK BY AND VERIFIED WITH: CARON AMEND, PHARMD @0252  05/28/16 MKELLY,MLT    Staphylococcus aureus DETECTED (A) NOT DETECTED Final    Comment: CRITICAL RESULT CALLED TO, READ BACK BY AND VERIFIED WITH: CARON AMEND,PHARMD @0252  05/28/16 MKELLY,MLT    Methicillin resistance NOT DETECTED NOT DETECTED Final   Streptococcus species NOT DETECTED NOT DETECTED Final   Streptococcus agalactiae NOT DETECTED NOT DETECTED Final   Streptococcus pneumoniae NOT DETECTED NOT DETECTED Final   Streptococcus pyogenes NOT DETECTED NOT DETECTED Final   Acinetobacter baumannii NOT DETECTED NOT DETECTED Final   Enterobacteriaceae species NOT DETECTED NOT DETECTED Final   Enterobacter cloacae complex NOT DETECTED NOT DETECTED Final   Escherichia coli NOT DETECTED NOT DETECTED Final   Klebsiella oxytoca NOT DETECTED NOT DETECTED Final   Klebsiella pneumoniae NOT DETECTED NOT DETECTED Final   Proteus species NOT DETECTED NOT DETECTED Final   Serratia marcescens NOT DETECTED NOT DETECTED Final   Carbapenem resistance NOT DETECTED  NOT DETECTED Final   Haemophilus influenzae NOT DETECTED NOT DETECTED Final   Neisseria meningitidis NOT DETECTED NOT DETECTED Final   Pseudomonas aeruginosa NOT DETECTED NOT DETECTED Final   Candida albicans NOT DETECTED NOT DETECTED Final   Candida glabrata NOT DETECTED NOT DETECTED Final   Candida krusei NOT DETECTED NOT DETECTED Final   Candida parapsilosis NOT DETECTED NOT DETECTED Final   Candida tropicalis NOT DETECTED NOT DETECTED Final  Culture, blood (Routine X 2) w Reflex to ID Panel     Status: None (Preliminary result)   Collection Time: 05/27/16  9:12 AM  Result Value Ref Range Status   Specimen Description BLOOD CENTRAL LINE  Final   Special Requests IN PEDIATRIC BOTTLE [place]  Final   Culture NO GROWTH <12 HOURS  Final   Report Status PENDING  Incomplete    Michel Bickers, MD [facility] [phone] pager   [phone] cell 05/28/2016, 10:33 AM

## 2016-05-28 NOTE — Progress Notes (Signed)
Reported to Dr Arcelia Jew that phlebotomy is unable to obtain blood cultures. Phlebotomy attempted multiple times but pt very edematous and weeping.

## 2016-05-28 NOTE — Progress Notes (Signed)
PCCM Video Bronchoscopy Procedure Note  The patient was informed of the risks (including but not limited to bleeding, infection, respiratory failure, lung injury, tooth/oral injury) and benefits of the procedure and gave consent, see chart.  Indication: Cavitary pneumonia  Post Procedure Diagnosis: Same  Location: [facility], Medical ICU  Condition pre procedure: Critically ill on vent  Medications for procedure: versed 33m, fentanyl 1023m IV  Procedure description: The bronchoscope was introduced through the endotracheal tube and passed to the bilateral lungs to the level of the subsegmental bronchi throughout the tracheobronchial tree.  Airway exam revealed friable mucosa throughout the tracheobronchial tree with thick mucus non-obstructing the airway throughout.  Procedures performed: BAL RML: 20cc sterile saline instilled, 18cc returned with thick mucus  Specimens sent: respiratory culture BAL  Condition post procedure: critically ill on vent  EBL: none from procedure  Complications: none  BrRoselie AwkwardMD [facility]CCM Pager: [phone]: [phone] 3pm or if no response, call [phone]

## 2016-05-28 NOTE — Progress Notes (Signed)
PHARMACY - PHYSICIAN COMMUNICATION CRITICAL VALUE ALERT - BLOOD CULTURE IDENTIFICATION (BCID)  Results for orders placed or performed during the hospital encounter of 05/27/16  Blood Culture ID Panel (Reflexed) (Collected: 05/27/2016  9:05 AM)  Result Value Ref Range   Enterococcus species NOT DETECTED NOT DETECTED   Vancomycin resistance NOT DETECTED NOT DETECTED   Listeria monocytogenes NOT DETECTED NOT DETECTED   Staphylococcus species DETECTED (A) NOT DETECTED   Staphylococcus aureus DETECTED (A) NOT DETECTED   Methicillin resistance NOT DETECTED NOT DETECTED   Streptococcus species NOT DETECTED NOT DETECTED   Streptococcus agalactiae NOT DETECTED NOT DETECTED   Streptococcus pneumoniae NOT DETECTED NOT DETECTED   Streptococcus pyogenes NOT DETECTED NOT DETECTED   Acinetobacter baumannii NOT DETECTED NOT DETECTED   Enterobacteriaceae species NOT DETECTED NOT DETECTED   Enterobacter cloacae complex NOT DETECTED NOT DETECTED   Escherichia coli NOT DETECTED NOT DETECTED   Klebsiella oxytoca NOT DETECTED NOT DETECTED   Klebsiella pneumoniae NOT DETECTED NOT DETECTED   Proteus species NOT DETECTED NOT DETECTED   Serratia marcescens NOT DETECTED NOT DETECTED   Carbapenem resistance NOT DETECTED NOT DETECTED   Haemophilus influenzae NOT DETECTED NOT DETECTED   Neisseria meningitidis NOT DETECTED NOT DETECTED   Pseudomonas aeruginosa NOT DETECTED NOT DETECTED   Candida albicans NOT DETECTED NOT DETECTED   Candida glabrata NOT DETECTED NOT DETECTED   Candida krusei NOT DETECTED NOT DETECTED   Candida parapsilosis NOT DETECTED NOT DETECTED   Candida tropicalis NOT DETECTED NOT DETECTED    Name of physician (or Provider) Contacted: Dr. Ashby Dawes  Changes to prescribed antibiotics required: None - will consider narrow abx soon  Sherlon Handing, PharmD, BCPS Clinical pharmacist, pager [phone] 05/28/2016  3:05 AM

## 2016-05-28 NOTE — Progress Notes (Signed)
*  PRELIMINARY RESULTS* Vascular Ultrasound Carotid Duplex (Doppler) has been completed.  Preliminary findings: Right: No significant (1-39%) ICA stenosis. Antegrade vertebral flow. Could not image left carotid due to line/ bandages.   Bilateral lower extremity venous duplex. No evidence of DVT. Left baker's cyst noted and appears ruptured into proximal calf.    Landry Mellow, RDMS, RVT  05/28/2016, 3:50 PM

## 2016-05-29 ENCOUNTER — Ambulatory Visit (HOSPITAL_COMMUNITY): Payer: Medicare Other

## 2016-05-29 DIAGNOSIS — E1169 Type 2 diabetes mellitus with other specified complication: Secondary | ICD-10-CM

## 2016-05-29 DIAGNOSIS — L97521 Non-pressure chronic ulcer of other part of left foot limited to breakdown of skin: Secondary | ICD-10-CM

## 2016-05-29 DIAGNOSIS — R230 Cyanosis: Secondary | ICD-10-CM

## 2016-05-29 DIAGNOSIS — M868X7 Other osteomyelitis, ankle and foot: Secondary | ICD-10-CM

## 2016-05-29 DIAGNOSIS — M86172 Other acute osteomyelitis, left ankle and foot: Secondary | ICD-10-CM | POA: Diagnosis present

## 2016-05-29 DIAGNOSIS — J9601 Acute respiratory failure with hypoxia: Secondary | ICD-10-CM | POA: Diagnosis present

## 2016-05-29 DIAGNOSIS — I340 Nonrheumatic mitral (valve) insufficiency: Secondary | ICD-10-CM

## 2016-05-29 DIAGNOSIS — A4101 Sepsis due to Methicillin susceptible Staphylococcus aureus: Principal | ICD-10-CM

## 2016-05-29 LAB — GLUCOSE, CAPILLARY
GLUCOSE-CAPILLARY: 151 mg/dL — AB (ref 65–99)
GLUCOSE-CAPILLARY: 175 mg/dL — AB (ref 65–99)
Glucose-Capillary: 233 mg/dL — ABNORMAL HIGH (ref 65–99)
Glucose-Capillary: 191 mg/dL — ABNORMAL HIGH (ref 65–99)
Glucose-Capillary: 97 mg/dL (ref 65–99)
Glucose-Capillary: 144 mg/dL — ABNORMAL HIGH (ref 65–99)
Glucose-Capillary: 96 mg/dL (ref 65–99)

## 2016-05-29 LAB — RENAL FUNCTION PANEL
ALBUMIN: 1.7 g/dL — AB (ref 3.5–5.0)
ALBUMIN: 1.7 g/dL — AB (ref 3.5–5.0)
ANION GAP: 13 (ref 5–15)
Anion gap: 12 (ref 5–15)
BUN: 36 mg/dL — ABNORMAL HIGH (ref 6–20)
BUN: 30 mg/dL — AB (ref 6–20)
CALCIUM: 7.4 mg/dL — AB (ref 8.9–10.3)
CO2: 26 mmol/L (ref 22–32)
CO2: 24 mmol/L (ref 22–32)
CREATININE: 1.64 mg/dL — AB (ref 0.44–1.00)
Calcium: 7.2 mg/dL — ABNORMAL LOW (ref 8.9–10.3)
Chloride: 100 mmol/L — ABNORMAL LOW (ref 101–111)
Chloride: 101 mmol/L (ref 101–111)
Creatinine, Ser: 1.94 mg/dL — ABNORMAL HIGH (ref 0.44–1.00)
GFR calc Af Amer: 36 mL/min — ABNORMAL LOW (ref >60)
GFR calc non Af Amer: 25 mL/min — ABNORMAL LOW (ref >60)
GFR, EST AFRICAN AMERICAN: 29 mL/min — AB (ref >60)
GFR, EST NON AFRICAN AMERICAN: 31 mL/min — AB (ref >60)
GLUCOSE: 220 mg/dL — AB (ref 65–99)
Glucose, Bld: 210 mg/dL — ABNORMAL HIGH (ref 65–99)
PHOSPHORUS: 4.2 mg/dL (ref 2.5–4.6)
PHOSPHORUS: 4.3 mg/dL (ref 2.5–4.6)
POTASSIUM: 4.6 mmol/L (ref 3.5–5.1)
Potassium: 5 mmol/L (ref 3.5–5.1)
SODIUM: 139 mmol/L (ref 135–145)
SODIUM: 137 mmol/L (ref 135–145)

## 2016-05-29 LAB — CK: Total CK: 412 U/L — ABNORMAL HIGH (ref 38–234)

## 2016-05-29 LAB — MPO/PR-3 (ANCA) ANTIBODIES: Myeloperoxidase Abs: <9 U/mL (ref 0.0–9.0)

## 2016-05-29 LAB — CBC
HEMATOCRIT: 34.3 % — AB (ref 36.0–46.0)
HEMOGLOBIN: 11.5 g/dL — AB (ref 12.0–15.0)
MCH: 27.3 pg (ref 26.0–34.0)
MCHC: 33.5 g/dL (ref 30.0–36.0)
MCV: 81.3 fL (ref 78.0–100.0)
Platelets: 114 10*3/uL — ABNORMAL LOW (ref 150–400)
RBC: 4.22 MIL/uL (ref 3.87–5.11)
RDW: 14.8 % (ref 11.5–15.5)
WBC: 47.2 10*3/uL — ABNORMAL HIGH (ref 4.0–10.5)

## 2016-05-29 LAB — ANTINUCLEAR ANTIBODIES, IFA: ANTINUCLEAR ANTIBODIES, IFA: NEGATIVE

## 2016-05-29 LAB — MAGNESIUM: Magnesium: 2.4 mg/dL (ref 1.7–2.4)

## 2016-05-29 LAB — GLOMERULAR BASEMENT MEMBRANE ANTIBODIES: GBM Ab: 2 units (ref 0–20)

## 2016-05-29 MED ORDER — INSULIN GLARGINE 100 UNIT/ML ~~LOC~~ SOLN
20.0000 [IU] | SUBCUTANEOUS | Status: DC
  Administered 2016-05-29: 20 [IU] via SUBCUTANEOUS
  Filled 2016-05-29 (×2): qty 0.2

## 2016-05-29 MED ORDER — MIDAZOLAM HCL 2 MG/2ML IJ SOLN
INTRAMUSCULAR | Status: AC
  Administered 2016-05-29: 2 mg
  Filled 2016-05-29: qty 2

## 2016-05-29 MED ORDER — SODIUM CHLORIDE 0.9 % IV SOLN: INTRAVENOUS | Status: DC

## 2016-05-29 MED ORDER — FENTANYL CITRATE (PF) 100 MCG/2ML IJ SOLN
INTRAMUSCULAR | Status: AC
  Administered 2016-05-29: 25 ug
  Filled 2016-05-29: qty 2

## 2016-05-29 NOTE — Progress Notes (Signed)
PHARMACY - PHYSICIAN COMMUNICATION CRITICAL VALUE ALERT - BLOOD CULTURE IDENTIFICATION (BCID)  Results for orders placed or performed during the hospital encounter of 05/27/16  Blood Culture ID Panel (Reflexed) (Collected: 05/27/2016  9:05 AM)  Result Value Ref Range   Enterococcus species NOT DETECTED NOT DETECTED   Vancomycin resistance NOT DETECTED NOT DETECTED   Listeria monocytogenes NOT DETECTED NOT DETECTED   Staphylococcus species DETECTED (A) NOT DETECTED   Staphylococcus aureus DETECTED (A) NOT DETECTED   Methicillin resistance NOT DETECTED NOT DETECTED   Streptococcus species NOT DETECTED NOT DETECTED   Streptococcus agalactiae NOT DETECTED NOT DETECTED   Streptococcus pneumoniae NOT DETECTED NOT DETECTED   Streptococcus pyogenes NOT DETECTED NOT DETECTED   Acinetobacter baumannii NOT DETECTED NOT DETECTED   Enterobacteriaceae species NOT DETECTED NOT DETECTED   Enterobacter cloacae complex NOT DETECTED NOT DETECTED   Escherichia coli NOT DETECTED NOT DETECTED   Klebsiella oxytoca NOT DETECTED NOT DETECTED   Klebsiella pneumoniae NOT DETECTED NOT DETECTED   Proteus species NOT DETECTED NOT DETECTED   Serratia marcescens NOT DETECTED NOT DETECTED   Carbapenem resistance NOT DETECTED NOT DETECTED   Haemophilus influenzae NOT DETECTED NOT DETECTED   Neisseria meningitidis NOT DETECTED NOT DETECTED   Pseudomonas aeruginosa NOT DETECTED NOT DETECTED   Candida albicans NOT DETECTED NOT DETECTED   Candida glabrata NOT DETECTED NOT DETECTED   Candida krusei NOT DETECTED NOT DETECTED   Candida parapsilosis NOT DETECTED NOT DETECTED   Candida tropicalis NOT DETECTED NOT DETECTED   Repeat positive as well on 1/11  Name of physician (or Provider) Contacted: Dr Megan Salon, Dr [place]  Changes to prescribed antibiotics required: None likely challenge with cefazolin post intubation  Wynell Balloon 05/29/2016  9:29 AM

## 2016-05-29 NOTE — Procedures (Signed)
Patient examined, database reviewed and plans noted for transesophageal echocardiogram to evaluate for possible subacute bacterial endocarditis given Staphylococcus bacteremia. Source still unclear but suspected to be from decubitus over sacrum and ulcers over lower extremities-left foot. Most likely etiology is ATN associated with sepsis-given current CPK levels, unlikely that this is pigment associated nephropathy. Blood pressure (!) 120/56, pulse 96, temperature 97.2 F (36.2 C), temperature source Oral, resp. rate 20, height 5\' 2"  (1.575 m), weight 84.9 kg (187 lb 2.7 oz), SpO2 100 %.   Without any evident renal recovery so far-continue CRRT at current prescription without any changes with plan to possibly discontinue this tomorrow and monitor her over the weekend if labs/volume status continues to allow this.  Elmarie Shiley MD [facility]. Office # [phone] Pager # [phone] 8:40 AM

## 2016-05-29 NOTE — Progress Notes (Signed)
Patient ID: Tracy Barker, female   DOB: 26-Dec-1945, 71 y.o.   MRN: [id number]          [facility]  Date of Admission:  05/27/2016           Day 3 linezolid  Principal Problem:   Staphylococcus aureus bacteremia with sepsis [facility]) Active Problems:   Cavitary pneumonia   Toe ulcer ([place])   Shock ([place])   Abdominal pain   Acute renal failure (ARF) (H[facility])   Increased anion gap metabolic acidosis   Acute respiratory failure with hypoxia ([place])   .  stroke: mapping our early stages of recovery book   Does not apply Once  . chlorhexidine gluconate (MEDLINE KIT)  15 mL Mouth Rinse BID  . heparin  5,000 Units Subcutaneous Q8H  . hydrocortisone sod succinate (SOLU-CORTEF) inj  50 mg Intravenous Q6H  . insulin aspart  0-15 Units Subcutaneous Q4H  . insulin aspart  3 Units Subcutaneous Q4H  . insulin glargine  20 Units Subcutaneous Q24H  . linezolid (ZYVOX) IV  600 mg Intravenous Q12H  . mouth rinse  15 mL Mouth Rinse QID  . pantoprazole (PROTONIX) IV  40 mg Intravenous QHS   Review of Systems: Review of Systems  Unable to perform ROS: Mental acuity    Past Medical History:  Diagnosis Date  . Diabetes mellitus ([place])    Type 2  . 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 Smoking status: Not on file  . Smokeless tobacco: Not on file  . Alcohol use Not on file    No family history on file. Allergies  Allergen Reactions  . Amlodipine   . Clindamycin/Lincomycin   . Zosyn [Piperacillin Sod-Tazobactam So]     OBJECTIVE: Vitals:   05/29/16 1400 05/29/16 1500 05/29/16 1523 05/29/16 1600  BP: 124/76 116/77  132/62  Pulse: 62 60  (!) 58  Resp: 14 17  19   Temp:   97.8 F (36.6 C)   TempSrc:   Axillary   SpO2: 98% 99%  100%  Weight:      Height:       Body mass index is 34.23 kg/m.  Physical Exam  Constitutional:  She is now extubated but confused. Her daughter is at the bedside.  Cardiovascular: Normal  rate and regular rhythm.   No murmur heard. Pulmonary/Chest:  Coarse breath sounds bilaterally.  Musculoskeletal:  Her toes on both feet are cool and blue. The toes on her left foot are slightly more swollen than they were this morning when I saw her. She continues to have ulceration over the dorsum of her left second, third and fifth toes.    Lab Results Lab Results  Component Value Date   WBC 47.2 (H) 05/29/2016   HGB 11.5 (L) 05/29/2016   HCT 34.3 (L) 05/29/2016   MCV 81.3 05/29/2016   PLT 114 (L) 05/29/2016    Lab Results  Component Value Date   CREATININE 1.64 (H) 05/29/2016   BUN 30 (H) 05/29/2016   NA 137 05/29/2016   K 5.0 05/29/2016   CL 101 05/29/2016   CO2 24 05/29/2016    Lab Results  Component Value Date   ALT 39 05/27/2016   AST 57 (H) 05/27/2016   ALKPHOS 164 (H) 05/27/2016   BILITOT 0.9 05/27/2016     Microbiology: Recent Results (from the past 240 hour(s))  MRSA PCR Screening     Status: None  Collection Time: 05/27/16  5:27 AM  Result Value Ref Range Status   MRSA by PCR NEGATIVE NEGATIVE Final    Comment:        The GeneXpert MRSA Assay (FDA approved for NASAL specimens only), is one component of a comprehensive MRSA colonization surveillance program. It is not intended to diagnose MRSA infection nor to guide or monitor treatment for MRSA infections.   Culture, Urine     Status: Abnormal   Collection Time: 05/27/16  8:04 AM  Result Value Ref Range Status   Specimen Description URINE, CATHETERIZED  Final   Special Requests NONE  Final   Culture 40,000 COLONIES/mL YEAST (A)  Final   Report Status 05/28/2016 FINAL  Final  Respiratory Panel by PCR     Status: Abnormal   Collection Time: 05/27/16  8:04 AM  Result Value Ref Range Status   Adenovirus NOT DETECTED NOT DETECTED Final   Coronavirus 229E NOT DETECTED NOT DETECTED Final   Coronavirus HKU1 NOT DETECTED NOT DETECTED Final   Coronavirus NL63 NOT DETECTED NOT DETECTED Final    Coronavirus OC43 NOT DETECTED NOT DETECTED Final   Metapneumovirus NOT DETECTED NOT DETECTED Final   Rhinovirus / Enterovirus DETECTED (A) NOT DETECTED Final   Influenza A NOT DETECTED NOT DETECTED Final   Influenza B NOT DETECTED NOT DETECTED Final   Parainfluenza Virus 1 NOT DETECTED NOT DETECTED Final   Parainfluenza Virus 2 NOT DETECTED NOT DETECTED Final   Parainfluenza Virus 3 NOT DETECTED NOT DETECTED Final   Parainfluenza Virus 4 NOT DETECTED NOT DETECTED Final   Respiratory Syncytial Virus NOT DETECTED NOT DETECTED Final   Bordetella pertussis NOT DETECTED NOT DETECTED Final   Chlamydophila pneumoniae NOT DETECTED NOT DETECTED Final   Mycoplasma pneumoniae NOT DETECTED NOT DETECTED Final  Culture, blood (Routine X 2) w Reflex to ID Panel     Status: Abnormal (Preliminary result)   Collection Time: 05/27/16  9:05 AM  Result Value Ref Range Status   Specimen Description BLOOD RIGHT HAND  Final   Special Requests IN PEDIATRIC BOTTLE 1CC  Final   Culture  Setup Time   Final    GRAM POSITIVE COCCI IN CLUSTERS IN PEDIATRIC BOTTLE CRITICAL RESULT CALLED TO, READ BACK BY AND VERIFIED WITH: CARON AMEND,PHARMD @0252  05/28/16 MKELLY,MLT    Culture (A)  Final    STAPHYLOCOCCUS AUREUS SUSCEPTIBILITIES TO FOLLOW    Report Status PENDING  Incomplete  Blood Culture ID Panel (Reflexed)     Status: Abnormal   Collection Time: 05/27/16  9:05 AM  Result Value Ref Range Status   Enterococcus species NOT DETECTED NOT DETECTED Final   Vancomycin resistance NOT DETECTED NOT DETECTED Final   Listeria monocytogenes NOT DETECTED NOT DETECTED Final   Staphylococcus species DETECTED (A) NOT DETECTED Final    Comment: CRITICAL RESULT CALLED TO, READ BACK BY AND VERIFIED WITH: CARON AMEND, PHARMD @0252  05/28/16 MKELLY,MLT    Staphylococcus aureus DETECTED (A) NOT DETECTED Final    Comment: CRITICAL RESULT CALLED TO, READ BACK BY AND VERIFIED WITH: CARON AMEND,PHARMD @0252  05/28/16 MKELLY,MLT     Methicillin resistance NOT DETECTED NOT DETECTED Final   Streptococcus species NOT DETECTED NOT DETECTED Final   Streptococcus agalactiae NOT DETECTED NOT DETECTED Final   Streptococcus pneumoniae NOT DETECTED NOT DETECTED Final   Streptococcus pyogenes NOT DETECTED NOT DETECTED Final   Acinetobacter baumannii NOT DETECTED NOT DETECTED Final   Enterobacteriaceae species NOT DETECTED NOT DETECTED Final   Enterobacter cloacae complex NOT DETECTED  NOT DETECTED Final   Escherichia coli NOT DETECTED NOT DETECTED Final   Klebsiella oxytoca NOT DETECTED NOT DETECTED Final   Klebsiella pneumoniae NOT DETECTED NOT DETECTED Final   Proteus species NOT DETECTED NOT DETECTED Final   Serratia marcescens NOT DETECTED NOT DETECTED Final   Carbapenem resistance NOT DETECTED NOT DETECTED Final   Haemophilus influenzae NOT DETECTED NOT DETECTED Final   Neisseria meningitidis NOT DETECTED NOT DETECTED Final   Pseudomonas aeruginosa NOT DETECTED NOT DETECTED Final   Candida albicans NOT DETECTED NOT DETECTED Final   Candida glabrata NOT DETECTED NOT DETECTED Final   Candida krusei NOT DETECTED NOT DETECTED Final   Candida parapsilosis NOT DETECTED NOT DETECTED Final   Candida tropicalis NOT DETECTED NOT DETECTED Final  Culture, blood (Routine X 2) w Reflex to ID Panel     Status: None (Preliminary result)   Collection Time: 05/27/16  9:12 AM  Result Value Ref Range Status   Specimen Description BLOOD CENTRAL LINE  Final   Special Requests IN PEDIATRIC BOTTLE 1CC  Final   Culture  Setup Time   Final    GRAM POSITIVE COCCI IN CLUSTERS IN PEDIATRIC BOTTLE CRITICAL VALUE NOTED.  VALUE IS CONSISTENT WITH PREVIOUSLY REPORTED AND CALLED VALUE.    Culture NO GROWTH 1 DAY  Final   Report Status PENDING  Incomplete  Culture, blood (routine x 2)     Status: None (Preliminary result)   Collection Time: 05/28/16 12:00 PM  Result Value Ref Range Status   Specimen Description BLOOD RIGHT HAND  Final   Special  Requests IN PEDIATRIC BOTTLE  .[place]  Final   Culture  Setup Time   Final    GRAM POSITIVE COCCI IN CLUSTERS IN PEDIATRIC BOTTLE CRITICAL VALUE NOTED.  VALUE IS CONSISTENT WITH PREVIOUSLY REPORTED AND CALLED VALUE. CRITICAL RESULT CALLED TO, READ BACK BY AND VERIFIED WITH: CARON AMEND,PHARMD @0710  05/29/16 MKELLY,MLT    Culture PENDING  Incomplete   Report Status PENDING  Incomplete  Culture, respiratory (NON-Expectorated)     Status: None (Preliminary result)   Collection Time: 05/28/16 12:58 PM  Result Value Ref Range Status   Specimen Description TRACHEAL ASPIRATE  Final   Special Requests Normal  Final   Gram Stain   Final    ABUNDANT WBC PRESENT,BOTH PMN AND MONONUCLEAR FEW GRAM POSITIVE COCCI IN PAIRS FEW YEAST    Culture CULTURE REINCUBATED FOR BETTER GROWTH  Final   Report Status PENDING  Incomplete     ASSESSMENT: She has MSSA bacteremia and pneumonia with 3 of 3 blood cultures positive. They were unable to draw more blood cultures yesterday because she was so edematous. It would be ideal to try to get blood cultures again to verify clearing of her bacteremia. She did not have any evidence of endocarditis by TEE today. The plain x-ray of her left foot does show apparent osteomyelitis in her left second toe. Her toes on both feet are cyanotic. I would recommend getting arterial Doppler studies to assess blood flow and orthopedic consultation. I suspect that the source of her staph bacteremia is her left foot. Her daughter tells me that her mother is allergic to piperacillin tazobactam. She had a severe generalized rash several years ago when she was being treated for previous left foot infection. I would simply continue linezolid for now.  PLAN: 1. Continue linezolid 2. Attempt more blood cultures tomorrow 3. Recommend arterial Doppler studies of her lower extremities 4. Recommend orthopedic consultation 5. Please call Dr. Lucianne Lei  [place] [phone]) for any infectious disease  questions this weekend  Michel Bickers, MD [facility] for [place] [phone] pager   [phone] cell 05/29/2016, 4:47 PM

## 2016-05-29 NOTE — Consult Note (Signed)
Reason for Consult: Left foot second toe osteomyelitis and third and fifth toe necrotic wounds Referring Physician: Critical care  Tracy Barker is an 71 y.o. female.  HPI: The patient is a 71 year old female with multiple medical problems who is currently in the intensive care unit at [facility]. She is currently on antibiotics and under sepsis protocol. I was asked to consult on the patient to evaluate her left foot especially her second toe due to x-ray evidence of osteomyelitis. Her daughter is at the bedside. The patient does have a history of diabetes and has had her fourth toe on the left foot removed before. She has had some chronic wounds on the dorsal aspect of her second third and fifth toes. She is currently undergoing dialysis at the bedside. She is awake and alert and does follow commands. I was able to independently review the x-rays of her left foot. There are some chronic destructive changes at the middle phalanx and the surrounding joints of the second toe to suggest chronic osteomyelitis.  Past Medical History:  Diagnosis Date  . Diabetes mellitus ([place])    Type 2  . Leukemia ([place])    not specified    No past surgical history on file.  No family history on file.  Social History:  has no tobacco, alcohol, and drug history on file.  Allergies:  Allergies  Allergen Reactions  . Amlodipine   . Clindamycin/Lincomycin   . Zosyn [Piperacillin Sod-Tazobactam So]     Medications: I have reviewed the patient's current medications.  Results for orders placed or performed during the hospital encounter of 05/27/16 (from the past 48 hour(s))  Glucose, capillary     Status: Abnormal   Collection Time: 05/27/16 11:41 PM  Result Value Ref Range   Glucose-Capillary 121 (H) 65 - 99 mg/dL   Comment 1 Notify RN    Comment 2 Document in Chart   Basic metabolic panel     Status: Abnormal   Collection Time: 05/28/16  3:03 AM  Result Value Ref Range   Sodium 138 135 - 145  mmol/L   Potassium 4.4 3.5 - 5.1 mmol/L   Chloride 93 (L) 101 - 111 mmol/L   CO2 23 22 - 32 mmol/L   Glucose, Bld 206 (H) 65 - 99 mg/dL   BUN 78 (H) 6 - 20 mg/dL   Creatinine, Ser 3.58 (H) 0.44 - 1.00 mg/dL    Comment: DELTA CHECK NOTED   Calcium 6.0 (LL) 8.9 - 10.3 mg/dL    Comment: CRITICAL RESULT CALLED TO, READ BACK BY AND VERIFIED WITH: FLYNT,F RN 05/28/2016 0401 JORDANS    GFR calc non Af Amer 12 (L) >60 mL/min   GFR calc Af Amer 14 (L) >60 mL/min    Comment: (NOTE) The eGFR has been calculated using the CKD EPI equation. This calculation has not been validated in all clinical situations. eGFR's persistently <60 mL/min signify possible Chronic Kidney Disease.    Anion gap 22 (H) 5 - 15    Comment: RESULT CHECKED  CBC     Status: Abnormal   Collection Time: 05/28/16  3:03 AM  Result Value Ref Range   WBC 24.2 (H) 4.0 - 10.5 K/uL   RBC 3.90 3.87 - 5.11 MIL/uL   Hemoglobin 10.8 (L) 12.0 - 15.0 g/dL   HCT 30.4 (L) 36.0 - 46.0 %   MCV 77.9 (L) 78.0 - 100.0 fL   MCH 27.7 26.0 - 34.0 pg   MCHC 35.5 30.0 - 36.0  g/dL   RDW 14.7 11.5 - 15.5 %   Platelets 119 (L) 150 - 400 K/uL    Comment: PLATELET COUNT CONFIRMED BY SMEAR  Magnesium     Status: Abnormal   Collection Time: 05/28/16  3:03 AM  Result Value Ref Range   Magnesium 1.5 (L) 1.7 - 2.4 mg/dL  Phosphorus     Status: None   Collection Time: 05/28/16  3:03 AM  Result Value Ref Range   Phosphorus 4.5 2.5 - 4.6 mg/dL  CK     Status: Abnormal   Collection Time: 05/28/16  3:03 AM  Result Value Ref Range   Total CK 1,239 (H) 38 - 234 U/L  Renal function panel (daily at 0500)     Status: Abnormal   Collection Time: 05/28/16  3:21 AM  Result Value Ref Range   Sodium 138 135 - 145 mmol/L   Potassium 4.4 3.5 - 5.1 mmol/L   Chloride 92 (L) 101 - 111 mmol/L   CO2 24 22 - 32 mmol/L   Glucose, Bld 207 (H) 65 - 99 mg/dL   BUN 77 (H) 6 - 20 mg/dL   Creatinine, Ser 3.60 (H) 0.44 - 1.00 mg/dL    Comment: DELTA CHECK NOTED    Calcium 6.1 (LL) 8.9 - 10.3 mg/dL    Comment: CRITICAL RESULT CALLED TO, READ BACK BY AND VERIFIED WITH: [facility] RN 05/28/2016 0352 JORDANS    Phosphorus 4.5 2.5 - 4.6 mg/dL   Albumin 1.6 (L) 3.5 - 5.0 g/dL   GFR calc non Af Amer 12 (L) >60 mL/min   GFR calc Af Amer 14 (L) >60 mL/min    Comment: (NOTE) The eGFR has been calculated using the CKD EPI equation. This calculation has not been validated in all clinical situations. eGFR's persistently <60 mL/min signify possible Chronic Kidney Disease.    Anion gap 22 (H) 5 - 15    Comment: RESULT CHECKED  Glucose, capillary     Status: Abnormal   Collection Time: 05/28/16  3:56 AM  Result Value Ref Range   Glucose-Capillary 173 (H) 65 - 99 mg/dL   Comment 1 Notify RN    Comment 2 Document in Chart   Glucose, capillary     Status: Abnormal   Collection Time: 05/28/16  7:50 AM  Result Value Ref Range   Glucose-Capillary 156 (H) 65 - 99 mg/dL   Comment 1 Notify RN    Comment 2 Document in Chart   Troponin I (q 6hr x 3)     Status: Abnormal   Collection Time: 05/28/16  8:26 AM  Result Value Ref Range   Troponin I 0.35 (HH) <0.03 ng/mL    Comment: CRITICAL VALUE NOTED.  VALUE IS CONSISTENT WITH PREVIOUSLY REPORTED AND CALLED VALUE.  Basic metabolic panel     Status: Abnormal   Collection Time: 05/28/16  9:30 AM  Result Value Ref Range   Sodium 139 135 - 145 mmol/L   Potassium 4.2 3.5 - 5.1 mmol/L   Chloride 91 (L) 101 - 111 mmol/L   CO2 28 22 - 32 mmol/L   Glucose, Bld 213 (H) 65 - 99 mg/dL   BUN 64 (H) 6 - 20 mg/dL   Creatinine, Ser 3.08 (H) 0.44 - 1.00 mg/dL   Calcium 6.2 (LL) 8.9 - 10.3 mg/dL    Comment: CRITICAL RESULT CALLED TO, READ BACK BY AND VERIFIED WITH: L ROBBINS,RN [id number] WILDERK    GFR calc non Af Amer 14 (L) >60 mL/min  GFR calc Af Amer 17 (L) >60 mL/min    Comment: (NOTE) The eGFR has been calculated using the CKD EPI equation. This calculation has not been validated in all clinical situations. eGFR's  persistently <60 mL/min signify possible Chronic Kidney Disease.    Anion gap 20 (H) 5 - 15  CBC     Status: Abnormal   Collection Time: 05/28/16  9:30 AM  Result Value Ref Range   WBC 27.1 (H) 4.0 - 10.5 K/uL   RBC 3.95 3.87 - 5.11 MIL/uL   Hemoglobin 10.7 (L) 12.0 - 15.0 g/dL   HCT 30.6 (L) 36.0 - 46.0 %   MCV 77.5 (L) 78.0 - 100.0 fL   MCH 27.1 26.0 - 34.0 pg   MCHC 35.0 30.0 - 36.0 g/dL   RDW 14.0 11.5 - 15.5 %   Platelets 124 (L) 150 - 400 K/uL  Mpo/pr-3 (anca) antibodies     Status: None   Collection Time: 05/28/16  9:30 AM  Result Value Ref Range   Myeloperoxidase Abs <9.0 0.0 - 9.0 U/mL   ANCA Proteinase 3 <3.5 0.0 - 3.5 U/mL    Comment: (NOTE) Performed At: [facility] [place], [place] [id number] Lindon Romp MD YK:[id number]   Glomerular basement membrane antibodies     Status: None   Collection Time: 05/28/16  9:30 AM  Result Value Ref Range   GBM Ab 2 0 - 20 units    Comment: (NOTE)                   Negative                   0 - 20                   Weak Positive             21 - 30                   Moderate to Strong Positive   >30 Performed At: [facility] [address] [place], [place] [id number] Lindon Romp MD [id number]   ANA, IFA (with reflex)     Status: None   Collection Time: 05/28/16  9:30 AM  Result Value Ref Range   ANA Ab, IFA Negative     Comment: (NOTE)                                     Negative   <1:80                                     Borderline  1:80                                     Positive   >1:80 Performed At: [facility] [address], [place] [id number] Lindon Romp MD MH:[id number]   Glucose, capillary     Status: Abnormal   Collection Time: 05/28/16 11:41 AM  Result Value Ref Range   Glucose-Capillary 169 (H) 65 - 99 mg/dL   Comment 1 Notify RN    Comment 2 Document in Chart   Culture, blood (routine x 2)     Status: None (Preliminary  result)    Collection Time: 05/28/16 12:00 PM  Result Value Ref Range   Specimen Description BLOOD RIGHT HAND    Special Requests IN PEDIATRIC BOTTLE  .Continental    Culture  Setup Time      GRAM POSITIVE COCCI IN CLUSTERS IN PEDIATRIC BOTTLE CRITICAL VALUE NOTED.  VALUE IS CONSISTENT WITH PREVIOUSLY REPORTED AND CALLED VALUE. CRITICAL RESULT CALLED TO, READ BACK BY AND VERIFIED WITH: CARON AMEND,PHARMD _0  05/29/16 MKELLY,MLT    Culture PENDING    Report Status PENDING   CK     Status: Abnormal   Collection Time: 05/28/16 12:11 PM  Result Value Ref Range   Total CK 1,090 (H) 38 - 234 U/L  Lactate dehydrogenase     Status: Abnormal   Collection Time: 05/28/16 12:11 PM  Result Value Ref Range   LDH 696 (H) 98 - 192 U/L  Culture, respiratory (NON-Expectorated)     Status: None (Preliminary result)   Collection Time: 05/28/16 12:58 PM  Result Value Ref Range   Specimen Description TRACHEAL ASPIRATE    Special Requests Normal    Gram Stain      ABUNDANT WBC PRESENT,BOTH PMN AND MONONUCLEAR FEW GRAM POSITIVE COCCI IN PAIRS FEW YEAST    Culture CULTURE REINCUBATED FOR BETTER GROWTH    Report Status PENDING   Troponin I (q 6hr x 3)     Status: Abnormal   Collection Time: 05/28/16  2:15 PM  Result Value Ref Range   Troponin I 0.32 (HH) <0.03 ng/mL    Comment: CRITICAL VALUE NOTED.  VALUE IS CONSISTENT WITH PREVIOUSLY REPORTED AND CALLED VALUE.  Renal function panel (daily at 1600)     Status: Abnormal   Collection Time: 05/28/16  3:31 PM  Result Value Ref Range   Sodium 138 135 - 145 mmol/L   Potassium 4.6 3.5 - 5.1 mmol/L   Chloride 97 (L) 101 - 111 mmol/L   CO2 24 22 - 32 mmol/L   Glucose, Bld 239 (H) 65 - 99 mg/dL   BUN 55 (H) 6 - 20 mg/dL   Creatinine, Ser 2.60 (H) 0.44 - 1.00 mg/dL   Calcium 6.9 (L) 8.9 - 10.3 mg/dL   Phosphorus 5.6 (H) 2.5 - 4.6 mg/dL   Albumin 1.6 (L) 3.5 - 5.0 g/dL   GFR calc non Af Amer 18 (L) >60 mL/min   GFR calc Af Amer 20 (L) >60 mL/min    Comment:  (NOTE) The eGFR has been calculated using the CKD EPI equation. This calculation has not been validated in all clinical situations. eGFR's persistently <60 mL/min signify possible Chronic Kidney Disease.    Anion gap 17 (H) 5 - 15  Glucose, capillary     Status: Abnormal   Collection Time: 05/28/16  3:31 PM  Result Value Ref Range   Glucose-Capillary 249 (H) 65 - 99 mg/dL   Comment 1 Notify RN    Comment 2 Document in Chart   Basic metabolic panel     Status: Abnormal   Collection Time: 05/28/16  7:30 PM  Result Value Ref Range   Sodium 138 135 - 145 mmol/L   Potassium 4.4 3.5 - 5.1 mmol/L   Chloride 99 (L) 101 - 111 mmol/L   CO2 25 22 - 32 mmol/L   Glucose, Bld 232 (H) 65 - 99 mg/dL   BUN 47 (H) 6 - 20 mg/dL   Creatinine, Ser 2.51 (H) 0.44 - 1.00 mg/dL   Calcium 6.7 (L) 8.9 - 10.3 mg/dL  GFR calc non Af Amer 18 (L) >60 mL/min   GFR calc Af Amer 21 (L) >60 mL/min    Comment: (NOTE) The eGFR has been calculated using the CKD EPI equation. This calculation has not been validated in all clinical situations. eGFR's persistently <60 mL/min signify possible Chronic Kidney Disease.    Anion gap 14 5 - 15  CBC     Status: Abnormal   Collection Time: 05/28/16  7:30 PM  Result Value Ref Range   WBC 35.1 (H) 4.0 - 10.5 K/uL   RBC 4.09 3.87 - 5.11 MIL/uL   Hemoglobin 11.1 (L) 12.0 - 15.0 g/dL   HCT 32.6 (L) 36.0 - 46.0 %   MCV 79.7 78.0 - 100.0 fL   MCH 27.1 26.0 - 34.0 pg   MCHC 34.0 30.0 - 36.0 g/dL   RDW 14.4 11.5 - 15.5 %   Platelets 117 (L) 150 - 400 K/uL    Comment: REPEATED TO VERIFY SPECIMEN CHECKED FOR CLOTS PLATELET COUNT CONFIRMED BY SMEAR   Troponin I (q 6hr x 3)     Status: Abnormal   Collection Time: 05/28/16  8:49 PM  Result Value Ref Range   Troponin I 0.22 (HH) <0.03 ng/mL    Comment: CRITICAL VALUE NOTED.  VALUE IS CONSISTENT WITH PREVIOUSLY REPORTED AND CALLED VALUE.  Glucose, capillary     Status: Abnormal   Collection Time: 05/28/16 11:19 PM  Result  Value Ref Range   Glucose-Capillary 233 (H) 65 - 99 mg/dL   Comment 1 Notify RN    Comment 2 Document in Chart   Glucose, capillary     Status: Abnormal   Collection Time: 05/29/16  3:18 AM  Result Value Ref Range   Glucose-Capillary 191 (H) 65 - 99 mg/dL   Comment 1 Document in Chart   Renal function panel (daily at 0500)     Status: Abnormal   Collection Time: 05/29/16  3:20 AM  Result Value Ref Range   Sodium 139 135 - 145 mmol/L   Potassium 4.6 3.5 - 5.1 mmol/L   Chloride 100 (L) 101 - 111 mmol/L   CO2 26 22 - 32 mmol/L   Glucose, Bld 210 (H) 65 - 99 mg/dL   BUN 36 (H) 6 - 20 mg/dL   Creatinine, Ser 1.94 (H) 0.44 - 1.00 mg/dL   Calcium 7.2 (L) 8.9 - 10.3 mg/dL   Phosphorus 4.2 2.5 - 4.6 mg/dL   Albumin 1.7 (L) 3.5 - 5.0 g/dL   GFR calc non Af Amer 25 (L) >60 mL/min   GFR calc Af Amer 29 (L) >60 mL/min    Comment: (NOTE) The eGFR has been calculated using the CKD EPI equation. This calculation has not been validated in all clinical situations. eGFR's persistently <60 mL/min signify possible Chronic Kidney Disease.    Anion gap 13 5 - 15  Magnesium     Status: None   Collection Time: 05/29/16  3:20 AM  Result Value Ref Range   Magnesium 2.4 1.7 - 2.4 mg/dL  CBC     Status: Abnormal   Collection Time: 05/29/16  3:20 AM  Result Value Ref Range   WBC 47.2 (H) 4.0 - 10.5 K/uL    Comment: REPEATED TO VERIFY   RBC 4.22 3.87 - 5.11 MIL/uL   Hemoglobin 11.5 (L) 12.0 - 15.0 g/dL   HCT 34.3 (L) 36.0 - 46.0 %   MCV 81.3 78.0 - 100.0 fL   MCH 27.3 26.0 - 34.0 pg  MCHC 33.5 30.0 - 36.0 g/dL   RDW 14.8 11.5 - 15.5 %   Platelets 114 (L) 150 - 400 K/uL    Comment: REPEATED TO VERIFY CONSISTENT WITH PREVIOUS RESULT   CK     Status: Abnormal   Collection Time: 05/29/16  3:20 AM  Result Value Ref Range   Total CK 412 (H) 38 - 234 U/L  Glucose, capillary     Status: None   Collection Time: 05/29/16  7:57 AM  Result Value Ref Range   Glucose-Capillary 97 65 - 99 mg/dL    Comment 1 Notify RN    Comment 2 Document in Chart   Glucose, capillary     Status: Abnormal   Collection Time: 05/29/16 11:26 AM  Result Value Ref Range   Glucose-Capillary 144 (H) 65 - 99 mg/dL   Comment 1 Notify RN    Comment 2 Document in Chart   Glucose, capillary     Status: None   Collection Time: 05/29/16  3:22 PM  Result Value Ref Range   Glucose-Capillary 96 65 - 99 mg/dL   Comment 1 Notify RN    Comment 2 Document in Chart   Renal function panel (daily at 1600)     Status: Abnormal   Collection Time: 05/29/16  4:00 PM  Result Value Ref Range   Sodium 137 135 - 145 mmol/L   Potassium 5.0 3.5 - 5.1 mmol/L   Chloride 101 101 - 111 mmol/L   CO2 24 22 - 32 mmol/L   Glucose, Bld 220 (H) 65 - 99 mg/dL   BUN 30 (H) 6 - 20 mg/dL   Creatinine, Ser 1.64 (H) 0.44 - 1.00 mg/dL   Calcium 7.4 (L) 8.9 - 10.3 mg/dL   Phosphorus 4.3 2.5 - 4.6 mg/dL   Albumin 1.7 (L) 3.5 - 5.0 g/dL   GFR calc non Af Amer 31 (L) >60 mL/min   GFR calc Af Amer 36 (L) >60 mL/min    Comment: (NOTE) The eGFR has been calculated using the CKD EPI equation. This calculation has not been validated in all clinical situations. eGFR's persistently <60 mL/min signify possible Chronic Kidney Disease.    Anion gap 12 5 - 15  Glucose, capillary     Status: Abnormal   Collection Time: 05/29/16  8:14 PM  Result Value Ref Range   Glucose-Capillary 151 (H) 65 - 99 mg/dL   Comment 1 Notify RN    Comment 2 Document in Chart     US Renal  Result Date: 05/28/2016 CLINICAL DATA:  Acute onset of renal insufficiency. Initial encounter. EXAM: RENAL / URINARY TRACT ULTRASOUND COMPLETE COMPARISON:  CT of the chest, abdomen and pelvis performed 05/27/2016 FINDINGS: Right Kidney: Length: 9.1 cm. Echogenicity within normal limits. Trace right-sided perinephric fluid is noted. No mass or hydronephrosis visualized. Left Kidney: Length: 10.7 cm. Echogenicity within normal limits. No mass or hydronephrosis visualized. Bladder:  Decompressed, with a Foley catheter in place. Trace bilateral pleural effusions are noted. The gallbladder is significantly distended, similar in appearance to the recent CT. 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 IMPRESSION: 1. No evidence of hydronephrosis. 2. Trace bilateral pleural effusions. 3. Significant gallbladder distention again noted, similar in appearance to the recent CT. 4. 3 cm mass again noted at the region of the left adrenal gland. Adrenal protocol MRI or CT would be helpful for further evaluation, when and as deemed clinically appropriate. Electronically Signed   By: Jacqulynn Cadet  Chang M.D.   On: 05/28/2016 02:41   Dg Chest Port 1 View  Result Date: 05/28/2016 CLINICAL DATA:  71 year old female with shock, renal failure. Intubated. Initial encounter. EXAM: PORTABLE CHEST 1 VIEW COMPARISON:  05/27/2016. FINDINGS: Portable AP semi upright view at 0444 hours. Stable endotracheal tube tip situated between the clavicles and carina. Enteric tube courses to the left abdomen, tip not included. Stable left IJ approach dual lumen dialysis type catheter. Stable lung volumes. Stable cardiac size and mediastinal contours. Streaky in confluent left lung base opacity is stable. No pneumothorax or pulmonary edema. No definite pleural effusion. No areas of worsening ventilation. IMPRESSION: 1.  Stable lines and tubes. 2. Stable ventilation with left lower lobe atelectasis versus consolidation. Electronically Signed   By: Genevie Ann M.D.   On: 05/28/2016 06:50   Dg Foot 2 Views Left  Result Date: 05/28/2016 CLINICAL DATA:  Swelling.  Ulcerations. EXAM: LEFT FOOT - 2 VIEW COMPARISON:  No recent prior . FINDINGS: Diffuse soft tissue swelling, particular prominent the left second and third digits. Left fourth digit amputation. Erosive changes noted of the distal aspect of the proximal phalanx of the left second digit and proximal aspect of the middle phalanx of the left second digit.  Osteomyelitis and septic arthritis could present in this fashion. Subluxation of the metacarpal phalangeal joints of the second and third digit noted. Degenerative changes noted about the foot. IMPRESSION: 1. Diffuse soft tissue swelling with particularly prominent soft tissue swelling about the left second and third digits. Bony erosive changes noted about the distal aspect of the proximal phalanx and proximal aspect of the middle phalanx of the left second digit. Findings consist with osteomyelitis and septic arthritis. 2. Subluxation of the second and third metacarpal phalangeal joints. 3.  Prior left fourth digit amputation. Electronically Signed   By: Marcello Moores  Register   On: 05/28/2016 12:05    ROS Blood pressure (!) 92/37, pulse (!) 58, temperature 97.4 F (36.3 C), temperature source Axillary, resp. rate 12, height _0  (1.575 m), weight 187 lb 2.7 oz (84.9 kg), SpO2 98 %. Physical Exam  Constitutional: She is oriented to person, place, and time.  Musculoskeletal:       Feet:  Neurological: She is alert and oriented to person, place, and time.    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  1) the chronic wounds on her second, third, and fifth toes are all dry but necrotic. There is no gross purulence encountered. I feel that at some point she will likely need removal of at least the second toe but potentially the third and fifth toes. However the wounds appear chronic and are stable. There is no drainage. I do not see an indication for an urgent amputation over the weekend. Certainly her medical status should be optimized before taken her to the operating room. I talked to her and her family at the bedside at length about this and they also agree with not taking her urgently to the operating room. Again they understand that she still will need likely some type of definitive treatment for at least  the second toe in the future. I will continue to follow her closely.  Mcarthur Rossetti 05/29/2016, 9:15 PM

## 2016-05-29 NOTE — Progress Notes (Signed)
PULMONARY / CRITICAL CARE MEDICINE   Name: Tracy Barker MRN: [id number] DOB: 1946-01-12    ADMISSION DATE:  05/27/2016 CONSULTATION DATE:  05/27/2016  REFERRING MD:  [facility]  CHIEF COMPLAINT:  MODS/shock  SUBJECTIVE:  On Levo & vasopressin No sedation  Alert, follows commands  Left side is weak On CRRT  VITAL SIGNS: BP (!) 77/54   Pulse 98   Temp 97.3 F (36.3 C) (Axillary)   Resp 16   Ht _0  (1.575 m) Comment: estimated  Wt 187 lb 2.7 oz (84.9 kg)   SpO2 100%   BMI 34.23 kg/m   HEMODYNAMICS: CVP:  [20 mmHg] 20 mmHg  VENTILATOR SETTINGS: Vent Mode: PRVC FiO2 (%):  [40 %-100 %] 40 % Set Rate:  [18 bmp] 18 bmp Vt Set:  [500 mL] 500 mL PEEP:  [5 cmH20] 5 cmH20 Pressure Support:  [10 cmH20] 10 cmH20 Plateau Pressure:  [12 cmH20-18 cmH20] 14 cmH20  INTAKE / OUTPUT: I/O last 3 completed shifts: In: 5187 [I.V.:3768.6; NG/GT:143.3; IV Piggyback:1275] Out: 2035 [Urine:445; Other:3758; Stool:1]  PHYSICAL EXAMINATION: General:  Obese female, on vent, NAD Neuro: eyes open, follows commands, moves right side easily but left weak against gravity  HEENT:  [place]/AT, PERRL, no JVD, orally intubated  Cardiovascular:  RRR, no MRG Lungs:  Diminished bases + increased rr  Abdomen:  Soft, generalized tenderness, non-distended Musculoskeletal:  No acute deformity or ROM limitation. Remote toe amputation. Skin: Grossly intact, cyanotic toes (uncertain chronicity)  LABS:  BMET  Recent Labs Lab 05/28/16 1531 05/28/16 1930 05/29/16 0320  NA 138 138 139  K 4.6 4.4 4.6  CL 97* 99* 100*  CO2 _1 BUN 55* 47* 36*  CREATININE 2.60* 2.51* 1.94*  GLUCOSE 239* 232* 210*    Electrolytes  Recent Labs Lab 05/27/16 0520  05/28/16 0303 05/28/16 0321  05/28/16 1531 05/28/16 1930 05/29/16 0320  CALCIUM 6.6*  < > 6.0* 6.1*  < > 6.9* 6.7* 7.2*  MG 1.2*  --  1.5*  --   --   --   --  2.4  PHOS 9.2*  --  4.5 4.5  --  5.6*  --  4.2  < > = values in this  interval not displayed.  CBC  Recent Labs Lab 05/28/16 0930 05/28/16 1930 05/29/16 0320  WBC 27.1* 35.1* 47.2*  HGB 10.7* 11.1* 11.5*  HCT 30.6* 32.6* 34.3*  PLT 124* 117* 114*    Coag's  Recent Labs Lab 05/27/16 0520  INR 1.50    Sepsis Markers  Recent Labs Lab 05/27/16 0520 05/27/16 0749  LATICACIDVEN 8.2* 10.8*    ABG  Recent Labs Lab 05/27/16 0550 05/27/16 1108  PHART 7.188* 7.277*  PCO2ART 33.6 28.7*  PO2ART 331* 142.0*    Liver Enzymes  Recent Labs Lab 05/27/16 0520 05/28/16 0321 05/28/16 1531 05/29/16 0320  AST 57*  --   --   --   ALT 39  --   --   --   ALKPHOS 164*  --   --   --   BILITOT 0.9  --   --   --   ALBUMIN 1.8* 1.6* 1.6* 1.7*    Cardiac Enzymes  Recent Labs Lab 05/28/16 0826 05/28/16 1415 05/28/16 2049  TROPONINI 0.35* 0.32* 0.22*    Glucose  Recent Labs Lab 05/28/16 0356 05/28/16 0750 05/28/16 1141 05/28/16 1531 05/28/16 2319 05/29/16 0318  GLUCAP 173* 156* 169* 249* 233* 191*    Imaging Dg Foot 2 Views Left  Result Date: 05/28/2016 CLINICAL DATA:  Swelling.  Ulcerations. EXAM: LEFT FOOT - 2 VIEW COMPARISON:  No recent prior . FINDINGS: Diffuse soft tissue swelling, particular prominent the left second and third digits. Left fourth digit amputation. Erosive changes noted of the distal aspect of the proximal phalanx of the left second digit and proximal aspect of the middle phalanx of the left second digit. Osteomyelitis and septic arthritis could present in this fashion. Subluxation of the metacarpal phalangeal joints of the second and third digit noted. Degenerative changes noted about the foot. IMPRESSION: 1. Diffuse soft tissue swelling with particularly prominent soft tissue swelling about the left second and third digits. Bony erosive changes noted about the distal aspect of the proximal phalanx and proximal aspect of the middle phalanx of the left second digit. Findings consist with osteomyelitis and septic  arthritis. 2. Subluxation of the second and third metacarpal phalangeal joints. 3.  Prior left fourth digit amputation. Electronically Signed   By: Marcello Moores  Register   On: 05/28/2016 12:05     STUDIES:  CT head 1/9> non-acute CT abdomen pelvis 1/9 > non-acute, gallbladder distention, colonic wall thickening CT chest 1/9 > bilateral opacities and cavitary lung masses  US Renal 1/11 > no evidence of hydronephrosis; 3 cm left adrenal mass  DG Left Foot >> osteomyelitis and septic arthritis   CULTURES: Blood 1/10 > 1 out of 2 with staph aureus (suseptabilities pending) Urine 1/10 > 40,000 colonies of yeast (likely insignificant)  Flu 1/10 > negative  RVP 1/10 > +Rhinovirus  BAL cultures 1/11 >>   ANTIBIOTICS: Meropenem 1/9 > 1/11 Linezolid 1/9 >  SIGNIFICANT EVENTS: 1/10 transferred to [facility], intubated  LINES/TUBES: ETT 1/10 >>> R fem CVL 1/10 >>>  DISCUSSION: H/o CLL, poorly controlled DM, admitted w/ Refractory shock/MODS as well as left sided weakness. Presume sepsis ? Source. Had cough X 3 weeks, and possibly evolving right sided infiltrate. But also nausea and vomiting. Working dx her is sepsis +/- new stroke. Sources abd/pelvis (wonder about bowel ischemia), evolving PNA, or mult diabetic wounds. Has been pan cultured, broad spec abx started, CT head neg for CVA. Currently in refractory shock/MODS w/ profound acidosis and worsening renal failure   Imaging to include head, abd and chest.  Will need emergent HD Trop elevation likely r/t demand ischemia.  Family on way, not sure that she will survive.   ASSESSMENT / PLAN:  NEUROLOGIC A:   Left sided weakness: CVA (CT head neg) vs. Septic emboli? vs. Weakness from recent fall  Acute metabolic encephalopathy - improved P:   RASS goal: 0 to -1 PRN sedation  Out side of window for endovascular intervention Stroke work up: carotid doppler, lower extremity US, lipid panel   PULMONARY A: Acute hypoxemic respiratory  failure in setting of profound acidosis and altered mental status  CT chest with bilateral opacities and cavitary lung masses - Likely septic emboli due to bacteremia vs. Malignancy vs. vasculitic process  S/P BAL 1/11 P:   Full vent support F/u CXR - stable BAL cultures pending  RVP + for rhinovirus, neg flu  ANCA, vasculitis serologies pending  Consider biopsy if BAL neg   CARDIOVASCULAR A:  Shock, septic/MODS-->source is unclear, lower extremity wounds?  Troponin elevation, no EKG ischemic changes P:  Telemetry monitoring MAP goal 45mHg Levophed and vasopressin infusions for MAP goal Place flow- track  Echocardiogram with hyperdynamic systolic function EF 694-50% grade 1 diastolic dysfunction; and no evidence of embolic source  Stop trending troponin (  peaked at 0.44) TEE today   RENAL A:   AKI; likely ATN due to sepsis + rhabdomyolysis?  Hyperkalemia AG metabolic acidosis Lactic + renal failure ->lactic acid not clearing.   P:   Renal following; appreciate recommendations CRRT  HCO3 infusion stopped Renal US without evidence of hydronephrosis  Follow labs, trend CK - improved  ANCA, vasculitis serologies pending   GASTROINTESTINAL A:   Vomiting Abd pain ? Ischemic bowel   P:   CT abd pelvis--> colonic wall thickening, gallbladder distention, lymphadenopathy  NPO Protonix IV  HEMATOLOGIC A:   CLL (Blast crisis?) vs sepsis Worsening leukocytosis  P:  Follow CBC Blood smear -> leukocytosis with neutrophilia, consistent with hx of CLL Heparin for VTE ppx  INFECTIOUS A:   Leukocytosis - worsening  SEPSIS/bacteremia  - unclear source, lower extremity wounds?  X-ray left foot with osteomyelitis and septic arthritis   P:   Continue linezolid (started at OSH) Meropenem stopped  Cultures pending  Podiatry consult for osteomyelitis  Pressors   ENDOCRINE A:   DM P:   Holding januvia, metformin, levemir CBG monitoring insulin gtt Ck BHA  Stress  dose steroids   FAMILY  - Updates: no family contact information available  - Inter-disciplinary family meet or Palliative Care meeting due by:  1/17  My ccm time 41 minutes  Velna Ochs, M.D. Pager: [phone] 05/29/2016, 6:23 AM

## 2016-05-29 NOTE — Progress Notes (Signed)
  Echocardiogram Echocardiogram Transesophageal has been performed.  Tracy Barker 05/29/2016, 11:34 AM

## 2016-05-30 ENCOUNTER — Encounter (HOSPITAL_COMMUNITY): Payer: Medicare Other

## 2016-05-30 DIAGNOSIS — R7881 Bacteremia: Secondary | ICD-10-CM

## 2016-05-30 DIAGNOSIS — M86172 Other acute osteomyelitis, left ankle and foot: Secondary | ICD-10-CM

## 2016-05-30 LAB — RENAL FUNCTION PANEL
ALBUMIN: 1.8 g/dL — AB (ref 3.5–5.0)
ANION GAP: 11 (ref 5–15)
Albumin: 1.6 g/dL — ABNORMAL LOW (ref 3.5–5.0)
Anion gap: 13 (ref 5–15)
BUN: 27 mg/dL — AB (ref 6–20)
BUN: 38 mg/dL — AB (ref 6–20)
CALCIUM: 7.5 mg/dL — AB (ref 8.9–10.3)
CALCIUM: 7.3 mg/dL — AB (ref 8.9–10.3)
CO2: 23 mmol/L (ref 22–32)
CO2: 22 mmol/L (ref 22–32)
CREATININE: 1.45 mg/dL — AB (ref 0.44–1.00)
CREATININE: 2.07 mg/dL — AB (ref 0.44–1.00)
Chloride: 104 mmol/L (ref 101–111)
Chloride: 100 mmol/L — ABNORMAL LOW (ref 101–111)
GFR calc Af Amer: 41 mL/min — ABNORMAL LOW (ref >60)
GFR calc Af Amer: 27 mL/min — ABNORMAL LOW (ref >60)
GFR calc non Af Amer: 23 mL/min — ABNORMAL LOW (ref >60)
GFR, EST NON AFRICAN AMERICAN: 36 mL/min — AB (ref >60)
GLUCOSE: 181 mg/dL — AB (ref 65–99)
GLUCOSE: 458 mg/dL — AB (ref 65–99)
PHOSPHORUS: 4.1 mg/dL (ref 2.5–4.6)
Phosphorus: 5.4 mg/dL — ABNORMAL HIGH (ref 2.5–4.6)
Potassium: 4.9 mmol/L (ref 3.5–5.1)
Potassium: 5.4 mmol/L — ABNORMAL HIGH (ref 3.5–5.1)
SODIUM: 140 mmol/L (ref 135–145)
SODIUM: 133 mmol/L — AB (ref 135–145)

## 2016-05-30 LAB — CULTURE, BLOOD (ROUTINE X 2)

## 2016-05-30 LAB — GLUCOSE, CAPILLARY
GLUCOSE-CAPILLARY: 166 mg/dL — AB (ref 65–99)
GLUCOSE-CAPILLARY: 384 mg/dL — AB (ref 65–99)
GLUCOSE-CAPILLARY: 413 mg/dL — AB (ref 65–99)
Glucose-Capillary: 189 mg/dL — ABNORMAL HIGH (ref 65–99)
Glucose-Capillary: 152 mg/dL — ABNORMAL HIGH (ref 65–99)
Glucose-Capillary: 508 mg/dL (ref 65–99)

## 2016-05-30 LAB — CBC WITH DIFFERENTIAL/PLATELET
Band Neutrophils: 0 %
Basophils Absolute: 0 10*3/uL (ref 0.0–0.1)
Basophils Relative: 0 %
Blasts: 0 %
Eosinophils Absolute: 0 10*3/uL (ref 0.0–0.7)
Eosinophils Relative: 0 %
HCT: 33.8 % — ABNORMAL LOW (ref 36.0–46.0)
HEMOGLOBIN: 11.2 g/dL — AB (ref 12.0–15.0)
LYMPHS ABS: 14.7 10*3/uL — AB (ref 0.7–4.0)
Lymphocytes Relative: 31 %
MCH: 27.5 pg (ref 26.0–34.0)
MCHC: 33.1 g/dL (ref 30.0–36.0)
MCV: 82.8 fL (ref 78.0–100.0)
METAMYELOCYTES PCT: 0 %
MONOS PCT: 5 %
MYELOCYTES: 0 %
Monocytes Absolute: 2.4 10*3/uL — ABNORMAL HIGH (ref 0.1–1.0)
Neutro Abs: 30.4 10*3/uL — ABNORMAL HIGH (ref 1.7–7.7)
Neutrophils Relative %: 64 %
Platelets: 94 10*3/uL — ABNORMAL LOW (ref 150–400)
Promyelocytes Absolute: 0 %
RBC: 4.08 MIL/uL (ref 3.87–5.11)
RDW: 15.3 % (ref 11.5–15.5)
WBC: 47.5 10*3/uL — AB (ref 4.0–10.5)
nRBC: 0 /100 WBC

## 2016-05-30 LAB — CBC
HCT: 32.6 % — ABNORMAL LOW (ref 36.0–46.0)
Hemoglobin: 10.7 g/dL — ABNORMAL LOW (ref 12.0–15.0)
MCH: 27.1 pg (ref 26.0–34.0)
MCHC: 32.8 g/dL (ref 30.0–36.0)
MCV: 82.5 fL (ref 78.0–100.0)
PLATELETS: 83 10*3/uL — AB (ref 150–400)
RBC: 3.95 MIL/uL (ref 3.87–5.11)
RDW: 15.3 % (ref 11.5–15.5)
WBC: 51.7 10*3/uL (ref 4.0–10.5)

## 2016-05-30 LAB — MAGNESIUM: MAGNESIUM: 2.5 mg/dL — AB (ref 1.7–2.4)

## 2016-05-30 MED ORDER — INSULIN ASPART 100 UNIT/ML ~~LOC~~ SOLN
0.0000 [IU] | SUBCUTANEOUS | Status: DC
  Administered 2016-05-31 (×2): 4 [IU] via SUBCUTANEOUS
  Administered 2016-05-31: 7 [IU] via SUBCUTANEOUS
  Administered 2016-05-31: 3 [IU] via SUBCUTANEOUS
  Administered 2016-05-31 (×2): 7 [IU] via SUBCUTANEOUS
  Administered 2016-06-01 (×2): 4 [IU] via SUBCUTANEOUS
  Administered 2016-06-01: 3 [IU] via SUBCUTANEOUS
  Administered 2016-06-01 – 2016-06-02 (×2): 4 [IU] via SUBCUTANEOUS
  Administered 2016-06-02: 11 [IU] via SUBCUTANEOUS
  Administered 2016-06-02: 3 [IU] via SUBCUTANEOUS
  Administered 2016-06-03: 11 [IU] via SUBCUTANEOUS
  Administered 2016-06-03: 7 [IU] via SUBCUTANEOUS
  Administered 2016-06-03: 3 [IU] via SUBCUTANEOUS
  Administered 2016-06-04 (×2): 15 [IU] via SUBCUTANEOUS
  Administered 2016-06-04: 4 [IU] via SUBCUTANEOUS
  Administered 2016-06-04: 3 [IU] via SUBCUTANEOUS
  Administered 2016-06-05: 11 [IU] via SUBCUTANEOUS
  Administered 2016-06-05 (×2): 4 [IU] via SUBCUTANEOUS
  Administered 2016-06-05: 3 [IU] via SUBCUTANEOUS
  Administered 2016-06-05 – 2016-06-06 (×2): 7 [IU] via SUBCUTANEOUS

## 2016-05-30 MED ORDER — INSULIN ASPART 100 UNIT/ML ~~LOC~~ SOLN
25.0000 [IU] | Freq: Once | SUBCUTANEOUS | Status: AC
  Administered 2016-05-30: 25 [IU] via SUBCUTANEOUS

## 2016-05-30 MED ORDER — INSULIN GLARGINE 100 UNIT/ML ~~LOC~~ SOLN
30.0000 [IU] | SUBCUTANEOUS | Status: DC
  Administered 2016-05-30 – 2016-06-03 (×5): 30 [IU] via SUBCUTANEOUS
  Filled 2016-05-30 (×5): qty 0.3

## 2016-05-30 MED ORDER — ORAL CARE MOUTH RINSE
15.0000 mL | Freq: Two times a day (BID) | OROMUCOSAL | Status: DC
  Administered 2016-05-30 – 2016-06-10 (×21): 15 mL via OROMUCOSAL

## 2016-05-30 NOTE — Progress Notes (Signed)
Patient ID: Tracy Barker, female   DOB: 1946-02-01, 71 y.o.   MRN: [id number] [facility] Progress Note   Assessment/ Plan:   1. AKI on CKD stage 3: Clinically suspected ATN associated with sepsis of unclear source at this time. Serologies negative for ANCA vasculitis, antinuclear antibody negative and anti-GBM antibody negative. Is on CRRT that was started 3 days ago for volume management/clearance-electrolytes now within acceptable limits and volume status improved. Plan to discontinue CRRT today and assess closely for renal recovery versus further intervention with dialysis/CRRT. She reports that she does have baseline chronic kidney disease for which he follows up with a nephrologist up in [place], [place]. 2. Staphylococcus bacteremia with sepsis: MSSA bacteremia with cavitary pneumonia for which he is on coverage with linezolid-source suspected to be probably from her lower extremity ulcers/osteomyelitis and without any evidence of endocarditis on TEE yesterday. Evaluated yesterday by orthopedic surgery and their plans noted. Remains pressor dependent. 3. Mixed anion-gap and non-anion gap acidosis: From acute renal failure/metformin and recent GI losses. Acidosis corrected with CRRT 4. Hyperkalemia:  corrected with CRRT 5. Abdominal pain:  no clear evidence of mesenteric ischemia-intra-abdominal adenopathy/dilated gallbladder noted.  Subjective:   Extubated yesterday and has done well overnight with oxygen via nasal cannula-no acute events.    Objective:   BP (!) 102/48 (BP Location: Left Arm)   Pulse (!) 55   Temp 97.3 F (36.3 C) (Axillary)   Resp 12   Ht 5' 2"  (1.575 m) Comment: estimated  Wt 80.7 kg (177 lb 14.6 oz)   SpO2 100%   BMI 32.54 kg/m   Intake/Output Summary (Last 24 hours) at 05/30/16 0736 Last data filed at 05/30/16 0700  Gross per 24 hour  Intake          1795.23 ml  Output             4170 ml  Net         -2374.77 ml   Weight change: -4.2 kg (-9 lb 4.2  oz)  Physical Exam: [id number]:[id number] resting in bed-awake and able to appropriately engage in conversation CVS: Pulse regular rhythm, Bradycardia, normal S1 and S2 Resp: Clear to auscultation, no rales or rhonchi Abd: Soft, obese, mildly tender right upper quadrant Ext: 1+ lower extremity edema  Imaging: Dg Foot 2 Views Left  Result Date: 05/28/2016 CLINICAL DATA:  Swelling.  Ulcerations. EXAM: LEFT FOOT - 2 VIEW COMPARISON:  No recent prior . FINDINGS: Diffuse soft tissue swelling, particular prominent the left second and third digits. Left fourth digit amputation. Erosive changes noted of the distal aspect of the proximal phalanx of the left second digit and proximal aspect of the middle phalanx of the left second digit. Osteomyelitis and septic arthritis could present in this fashion. Subluxation of the metacarpal phalangeal joints of the second and third digit noted. Degenerative changes noted about the foot. IMPRESSION: 1. Diffuse soft tissue swelling with particularly prominent soft tissue swelling about the left second and third digits. Bony erosive changes noted about the distal aspect of the proximal phalanx and proximal aspect of the middle phalanx of the left second digit. Findings consist with osteomyelitis and septic arthritis. 2. Subluxation of the second and third metacarpal phalangeal joints. 3.  Prior left fourth digit amputation. Electronically Signed   By: Marcello Moores  Register   On: 05/28/2016 12:05    Labs: BMET  Recent Labs Lab 05/27/16 0520  05/28/16 0303 05/28/16 0321 05/28/16 0930 05/28/16 1531 05/28/16 1930 05/29/16 0320 05/29/16 1600 05/30/16  0500  NA 137  < > 138 138 139 138 138 139 137 140  K 5.3*  < > 4.4 4.4 4.2 4.6 4.4 4.6 5.0 4.9  CL 100*  < > 93* 92* 91* 97* 99* 100* 101 104  CO2 10*  < > 23 24 28 24 25 26 24 23   GLUCOSE 283*  < > 206* 207* 213* 239* 232* 210* 220* 181*  BUN 109*  < > 78* 77* 64* 55* 47* 36* 30* 27*  CREATININE 6.29*  < > 3.58* 3.60*  3.08* 2.60* 2.51* 1.94* 1.64* 1.45*  CALCIUM 6.6*  < > 6.0* 6.1* 6.2* 6.9* 6.7* 7.2* 7.4* 7.5*  PHOS 9.2*  --  4.5 4.5  --  5.6*  --  4.2 4.3 4.1  < > = values in this interval not displayed. CBC  Recent Labs Lab 05/27/16 0520  05/28/16 0930 05/28/16 1930 05/29/16 0320 05/30/16 0500  WBC 33.7*  < > 27.1* 35.1* 47.2* 51.7*  NEUTROABS 15.2*  --   --   --   --   --   HGB 11.2*  < > 10.7* 11.1* 11.5* 10.7*  HCT 35.3*  < > 30.6* 32.6* 34.3* 32.6*  MCV 84.9  < > 77.5* 79.7 81.3 82.5  PLT 212  < > 124* 117* 114* 83*  < > = values in this interval not displayed. Medications:    .  stroke: mapping our early stages of recovery book   Does not apply Once  . chlorhexidine gluconate (MEDLINE KIT)  15 mL Mouth Rinse BID  . heparin  5,000 Units Subcutaneous Q8H  . hydrocortisone sod succinate (SOLU-CORTEF) inj  50 mg Intravenous Q6H  . insulin aspart  0-15 Units Subcutaneous Q4H  . insulin glargine  20 Units Subcutaneous Q24H  . linezolid (ZYVOX) IV  600 mg Intravenous Q12H  . mouth rinse  15 mL Mouth Rinse QID  . pantoprazole (PROTONIX) IV  40 mg Intravenous QHS   Elmarie Shiley, MD 05/30/2016, 7:36 AM

## 2016-05-30 NOTE — Progress Notes (Signed)
CRITICAL VALUE ALERT  Critical value received:  Wbc 51.7  Date of notification:  05/30/2016  Time of notification:  [id number]  Critical value read back:Yes.    Nurse who received alert:  Marisue Brooklyn  MD notified (1st page):  Dr Dion Saucier   Time of first page:  0550  MD notified (2nd page):  Time of second page:  Responding MD:  Dr Dion Saucier   Time MD responded:  [phone]

## 2016-05-30 NOTE — Progress Notes (Signed)
PT Cancellation Note  Patient Details Name: Tracy Barker MRN: [id number] DOB: 1945-06-20   Cancelled Treatment:    Reason Eval/Treat Not Completed: Patient not medically ready (currently with femoral A line)   Duncan Dull 05/30/2016, 3:23 PM Alben Deeds, [place] DPT  [phone]

## 2016-05-30 NOTE — Progress Notes (Signed)
Blood sugar 413 Dr Oletta Darter aware per order to give 15 units insulin and a diet change to carb modify clear liquids was made. Will recheck cbg again now.  Desmond Dike RN

## 2016-05-30 NOTE — Progress Notes (Addendum)
PULMONARY / CRITICAL CARE MEDICINE   Name: Tracy Barker MRN: [id number] DOB: 12-03-1945    ADMISSION DATE:  05/27/2016 CONSULTATION DATE:  05/27/2016  [facility]  [facility]  CHIEF COMPLAINT:  MODS/shock  SUBJECTIVE:  Extubated yesterday, went well Oriented today Some bradycardia Remains on vasopressors  VITAL SIGNS: BP 98/63 (BP Location: Right Arm)   Pulse (!) 58   Temp (!) 94.5 F (34.7 C) (Oral)   Resp 15   Ht _0  (1.575 m) Comment: estimated  Wt 80.7 kg (177 lb 14.6 oz)   SpO2 99%   BMI 32.54 kg/m   HEMODYNAMICS:    VENTILATOR SETTINGS: FiO2 (%):  [40 %] 40 %  INTAKE / OUTPUT: I/O last 3 completed shifts: In: 2829.5 [I.V.:1978.7; NG/GT:250.8; IV Piggyback:600] Out: 4315 [Urine:35; QMGQQ:7619]  PHYSICAL EXAMINATION: General:  Awake, alert HENT: NCAT OP clear PULM: CTA B, normal effort CV: No MGR, RRR GI: BS+, soft, nontender DERM: digital cyanotic changes (finger tips, toes) but normal cap refill Neuro: Awake, alert, oriented to situation  LABS:  BMET  Recent Labs Lab 05/29/16 0320 05/29/16 1600 05/30/16 0500  NA 139 137 140  K 4.6 5.0 4.9  CL 100* 101 104  CO2 _1 BUN 36* 30* 27*  CREATININE 1.94* 1.64* 1.45*  GLUCOSE 210* 220* 181*    Electrolytes  Recent Labs Lab 05/28/16 0303  05/29/16 0320 05/29/16 1600 05/30/16 0500  CALCIUM 6.0*  < > 7.2* 7.4* 7.5*  MG 1.5*  --  2.4  --  2.5*  PHOS 4.5  < > 4.2 4.3 4.1  < > = values in this interval not displayed.  CBC  Recent Labs Lab 05/28/16 1930 05/29/16 0320 05/30/16 0500  WBC 35.1* 47.2* 51.7*  HGB 11.1* 11.5* 10.7*  HCT 32.6* 34.3* 32.6*  PLT 117* 114* 83*    Coag's  Recent Labs Lab 05/27/16 0520  INR 1.50    Sepsis Markers  Recent Labs Lab 05/27/16 0520 05/27/16 0749  LATICACIDVEN 8.2* 10.8*    ABG  Recent Labs Lab 05/27/16 0550 05/27/16 1108  PHART 7.188* 7.277*  PCO2ART 33.6 28.7*  PO2ART 331* 142.0*    Liver  Enzymes  Recent Labs Lab 05/27/16 0520  05/29/16 0320 05/29/16 1600 05/30/16 0500  AST 57*  --   --   --   --   ALT 39  --   --   --   --   ALKPHOS 164*  --   --   --   --   BILITOT 0.9  --   --   --   --   ALBUMIN 1.8*  < > 1.7* 1.7* 1.8*  < > = values in this interval not displayed.  Cardiac Enzymes  Recent Labs Lab 05/28/16 0826 05/28/16 1415 05/28/16 2049  TROPONINI 0.35* 0.32* 0.22*    Glucose  Recent Labs Lab 05/29/16 1126 05/29/16 1522 05/29/16 2014 05/29/16 2349 05/30/16 0336 05/30/16 0753  GLUCAP 144* 96 151* 175* 189* 152*    Imaging No results found.   STUDIES:  CT head 1/9> non-acute CT abdomen pelvis 1/9 > non-acute, gallbladder distention, colonic wall thickening CT chest 1/9 > bilateral opacities and cavitary lung masses  US Renal 1/11 > no evidence of hydronephrosis; 3 cm left adrenal mass  DG Left Foot >> osteomyelitis and septic arthritis  1/12 TEE>   CULTURES: Blood 1/10 > 2 out of 2 with staph aureus (suseptabilities pending) Urine 1/10 > 40,000 colonies of yeast (likely insignificant)  Flu 1/10 > negative  RVP 1/10 > +Rhinovirus  Blood 1/11 > GPC in clusters BAL cultures 1/11 >> no vegetation  ANTIBIOTICS: Meropenem 1/9 > 1/11 Linezolid 1/9 >  SIGNIFICANT EVENTS: 1/10 transferred to [facility], intubated, CVVHD  LINES/TUBES: ETT 1/10 >>> R fem CVL 1/10 >>>  DISCUSSION: H/o CLL, poorly controlled DM, admitted w/ septic shock, respiratory failure, AKI in setting of MSSA bacteremia.  May have b-lactam allergy. Remains on pressors, extubated 1/13.  ASSESSMENT / PLAN:  NEUROLOGIC A:   Acute metabolic encephalopathy - improved Left sided weakness> minimal, improving unlikely stroke, more likely related to recent fall on that side P:   Stop sedation protocol MRI Brain after off pressors, HD stable   PULMONARY A: Acute hypoxemic respiratory failure> resolved  CT chest with bilateral opacities and cavitary lung masses -  Likely septic emboli due to bacteremia S/P BAL 1/11 P:   Follow BAL culture RN to assess aspiration risk, then advance diet  CARDIOVASCULAR A:  Shock, septic/MODS> hemodynamics better 1/13, but WBC; no clear source Troponin elevation, no EKG ischemic changes Digital ischemia changes> vasopressors related vs embolic given bacteremia Afib > resolved on amiodarone, some bradycardia 1/13 P:  Tele Monitor for limb ischemia Warm compresses fingers/toes Wean off levophed Stop amiodarone Vasopressors for MAP > 65 Continue stress dose steroids  RENAL A:   AKI; likely ATN due to sepsis   P:   Holding CVVHD today Monitor BMET and UOP Replace electrolytes as needed   GASTROINTESTINAL A:   No acute issues  P:   Advance diet if not aspiration risk  HEMATOLOGIC A:   Worsening leukocytosis > due to sepsis? Due to sepsis? CLL? Hydrocortisone? P:  Send CBC with diff, smear   INFECTIOUS A:   Leukocytosis - worsening  SEPSIS/bacteremia  - due to chronic osteomyelitis, sacral wound Persistent bacteremia but source osteo toes? TEE negative for vegetation X-ray left foot with osteomyelitis   P:   Linezolid (started at OSH) per ID Appreciate orthopedics will need amputation, agree doesn't need emergently Repeat blood cultures Remove femoral line 1/13, HD line 1/14 if off pressors   ENDOCRINE A:   DM P:   Holding januvia, metformin, levemir SSI Stress dose steroids   FAMILY  - Updates: have updated daughter Sharyn Lull daily, last 1/11  - Inter-disciplinary family meet or Palliative Care meeting due by:  1/17  My ccm time 35 minutes  Simonne Maffucci, M.D. Pager: [phone] 05/30/2016, 9:00 AM

## 2016-05-30 NOTE — Progress Notes (Signed)
eLink Physician-Brief Progress Note Patient Name: Tracy Barker DOB: 09-24-1945 MRN: [id number]   Date of Service  05/30/2016  HPI/Events of Note  Hyperglycemia - Blood glucose = 413 post eating meal. Patient on a Q 4 hour moderate Novolog SSI and clear liquid diet.   eICU Interventions  Will order: 1. Please give Novolog 15 units per sliding scale. 2. Will change diet to carb modified clear liquid diet.      Intervention Category Intermediate Interventions: Hyperglycemia - evaluation and treatment  Sommer,Steven Cornelia Copa 05/30/2016, 4:55 PM

## 2016-05-30 NOTE — Progress Notes (Signed)
CRRT stopped blood returned per MD instructions.  Desmond Dike RN

## 2016-05-30 NOTE — Evaluation (Signed)
Occupational Therapy Evaluation Patient Details Name: Tracy Barker MRN: [id number] DOB: April 02, 1946 Today's Date: 05/30/2016    History of Present Illness This 71 y.o. female transferred from [facility] ED with septic shock  due to staph areus thought to be due to osteomyelitis of Lt second toe - work up underway.  Tracy Barker was intubated 05/27/16 >>05/29/16.  Tracy Barker wtih Lt intermittent Lt sided weakness upon presentation - Head CT without acute changes.  PMH includes:  CLL, DM, HTN, and h/o falls per H&P she was a "frequent flyer" in ED typically for falls     Clinical Impression   Tracy Barker admitted with above. She demonstrates the below listed deficits and will benefit from continued OT to maximize safety and independence with BADLs.  Eval limited to bed level exercise due to Tracy Barker with femoral A - line.  She demonstrates bil. UE weakness, decreased activity tolerance, generalized weakness, impaired cognition.  She reports she lives alone, but no family present to confirm info.  Anticipate she will need SNF level rehab at discharge.  Will follow acutely.       Follow Up Recommendations  SNF    Equipment Recommendations  None recommended by OT    Recommendations for Other Services       Precautions / Restrictions Precautions Precautions: Fall;Other (comment) Precaution Comments: multiple lines; Tracy Barker with femoral A-line      Mobility Bed Mobility Overal bed mobility: Needs Assistance Bed Mobility: Rolling Rolling: Max assist            Transfers                 General transfer comment: unable to attempt     Balance                                            ADL Overall ADL's : Needs assistance/impaired Eating/Feeding: Bed level;Moderate assistance Eating/Feeding Details (indicate cue type and reason): able to drink from cup mod I  Grooming: Wash/dry hands;Wash/dry face;Supervision/safety;Bed level Grooming Details (indicate cue type and reason): total A for oral  care and combing hair  Upper Body Bathing: Total assistance;Bed level   Lower Body Bathing: Total assistance;Bed level   Upper Body Dressing : Total assistance;Bed level   Lower Body Dressing: Total assistance;Bed level   Toilet Transfer: Total assistance Toilet Transfer Details (indicate cue type and reason): unable to attempt due to femoral A-line  Toileting- Clothing Manipulation and Hygiene: Total assistance;Bed level         General ADL Comments: activity limited to bed level due to femoral A-line      Vision     Perception     Praxis      Pertinent Vitals/Pain Pain Assessment: No/denies pain     Hand Dominance Right   Extremity/Trunk Assessment Upper Extremity Assessment Upper Extremity Assessment: RUE deficits/detail;LUE deficits/detail RUE Deficits / Details: edema noted.  grossly 2+/5 shoulder, 3+/5 elbow and hand  RUE Coordination: decreased fine motor;decreased gross motor LUE Deficits / Details: Edema noted.  shoulder 2/5; elbow 3/5, hand 3/5  LUE Coordination: decreased fine motor;decreased gross motor   Lower Extremity Assessment Lower Extremity Assessment: Defer to Tracy Barker evaluation   Cervical / Trunk Assessment Cervical / Trunk Assessment:  (not assessed )   Communication Communication Communication: Other (comment) (hoarse voice )   Cognition Arousal/Alertness: Awake/alert Behavior During Therapy: Flat affect Overall Cognitive Status:  Impaired/Different from baseline Area of Impairment: Attention;Memory;Following commands;Safety/judgement;Awareness;Problem solving   Current Attention Level: Sustained (with cues ) Memory: Decreased short-term memory Following Commands: Follows one step commands consistently (max cues for two step command ) Safety/Judgement: Decreased awareness of safety;Decreased awareness of deficits Awareness: Intellectual Problem Solving: Slow processing;Decreased initiation;Requires verbal cues General Comments: Tracy Barker unable to  recall details of her home   General Comments       Exercises Exercises: General Upper Extremity     Shoulder Instructions      Home Living Family/patient expects to be discharged to:: Skilled nursing facility Living Arrangements: Alone Available Help at Discharge: Family Type of Home: House                           Additional Comments: Tracy Barker is able to state she lives alone, and her daughter lives close by.  Daughter works during the day.  Tracy Barker is unable to provide details of home environment      Prior Functioning/Environment Level of Independence: Independent        Comments: Tracy Barker reports she was independent.  Per chart, there is a h/o falls with frequent trips to ED.  No family present to confirm information         OT Problem List: Decreased strength;Decreased activity tolerance;Impaired balance (sitting and/or standing);Decreased cognition;Decreased safety awareness;Decreased knowledge of use of DME or AE;Cardiopulmonary status limiting activity;Impaired UE functional use   OT Treatment/Interventions: Self-care/ADL training;Therapeutic exercise;DME and/or AE instruction;Therapeutic activities;Cognitive remediation/compensation;Patient/family education;Balance training;Energy conservation    OT Goals(Current goals can be found in the care plan section) Acute Rehab OT Goals Patient Stated Goal: to get better  OT Goal Formulation: With patient Time For Goal Achievement: 06/13/16 Potential to Achieve Goals: Good ADL Goals Tracy Barker Will Perform Grooming: with mod assist;sitting (EOB) Tracy Barker Will Perform Upper Body Bathing: with mod assist;sitting Tracy Barker/caregiver will Perform Home Exercise Program: Increased strength;Right Upper extremity;Left upper extremity;With minimal assist;With written HEP provided Additional ADL Goal #1: Tracy Barker will tolerate EOB x 15 mins with mod A in prep for ADLs   OT Frequency: Min 2X/week   Barriers to D/C: Decreased caregiver support           Co-evaluation              End of Session Equipment Utilized During Treatment: Oxygen Nurse Communication: Mobility status  Activity Tolerance: Treatment limited secondary to medical complications (Comment);Patient limited by fatigue (femoral A - line ) Patient left: in bed;with call bell/phone within reach   Time: 1450-1507 OT Time Calculation (min): 17 min Charges:  OT General Charges $OT Visit: 1 Procedure OT Evaluation $OT Eval Moderate Complexity: 1 Procedure G-Codes:    Lucille Passy M Jun 28, 2016, 3:49 PM

## 2016-05-30 NOTE — Progress Notes (Signed)
eLink Physician-Brief Progress Note Patient Name: Tracy Barker DOB: 1946-03-16 MRN: [id number]   Date of Service  05/30/2016  HPI/Events of Note  Blood glucose = 508.  eICU Interventions  Will order: 1. Increase Lantus dose to 30 units Moca Q day. 2. Novolog 25 units  now.      Intervention Category Major Interventions: Hyperglycemia - active titration of insulin therapy  Sommer,Steven Eugene 05/30/2016, 8:30 PM

## 2016-05-30 NOTE — Progress Notes (Signed)
CBG= 384 will follow insulin scale orders.

## 2016-05-31 LAB — RENAL FUNCTION PANEL
ALBUMIN: 1.8 g/dL — AB (ref 3.5–5.0)
Anion gap: 10 (ref 5–15)
BUN: 47 mg/dL — AB (ref 6–20)
CHLORIDE: 102 mmol/L (ref 101–111)
CO2: 22 mmol/L (ref 22–32)
CREATININE: 2.44 mg/dL — AB (ref 0.44–1.00)
Calcium: 7.8 mg/dL — ABNORMAL LOW (ref 8.9–10.3)
GFR, EST AFRICAN AMERICAN: 22 mL/min — AB (ref >60)
GFR, EST NON AFRICAN AMERICAN: 19 mL/min — AB (ref >60)
Glucose, Bld: 168 mg/dL — ABNORMAL HIGH (ref 65–99)
PHOSPHORUS: 4.8 mg/dL — AB (ref 2.5–4.6)
POTASSIUM: 4.9 mmol/L (ref 3.5–5.1)
Sodium: 134 mmol/L — ABNORMAL LOW (ref 135–145)

## 2016-05-31 LAB — CBC WITH DIFFERENTIAL/PLATELET
Basophils Absolute: 0 10*3/uL (ref 0.0–0.1)
Basophils Relative: 0 %
Eosinophils Absolute: 0 10*3/uL (ref 0.0–0.7)
Eosinophils Relative: 0 %
HCT: 27.1 % — ABNORMAL LOW (ref 36.0–46.0)
HEMOGLOBIN: 9 g/dL — AB (ref 12.0–15.0)
LYMPHS PCT: 57 %
Lymphs Abs: 27.8 10*3/uL — ABNORMAL HIGH (ref 0.7–4.0)
MCH: 27.2 pg (ref 26.0–34.0)
MCHC: 33.2 g/dL (ref 30.0–36.0)
MCV: 81.9 fL (ref 78.0–100.0)
MONO ABS: 1.5 10*3/uL — AB (ref 0.1–1.0)
MONOS PCT: 3 %
NEUTROS ABS: 19.5 10*3/uL — AB (ref 1.7–7.7)
Neutrophils Relative %: 40 %
PLATELETS: 72 10*3/uL — AB (ref 150–400)
RBC: 3.31 MIL/uL — ABNORMAL LOW (ref 3.87–5.11)
RDW: 15.2 % (ref 11.5–15.5)
WBC: 48.8 10*3/uL — AB (ref 4.0–10.5)

## 2016-05-31 LAB — GLUCOSE, CAPILLARY
GLUCOSE-CAPILLARY: 234 mg/dL — AB (ref 65–99)
Glucose-Capillary: 144 mg/dL — ABNORMAL HIGH (ref 65–99)
Glucose-Capillary: 154 mg/dL — ABNORMAL HIGH (ref 65–99)
Glucose-Capillary: 169 mg/dL — ABNORMAL HIGH (ref 65–99)
Glucose-Capillary: 225 mg/dL — ABNORMAL HIGH (ref 65–99)
Glucose-Capillary: 231 mg/dL — ABNORMAL HIGH (ref 65–99)

## 2016-05-31 LAB — MAGNESIUM: MAGNESIUM: 2.4 mg/dL (ref 1.7–2.4)

## 2016-05-31 MED ORDER — CEFAZOLIN IN D5W 1 GM/50ML IV SOLN
1.0000 g | INTRAVENOUS | Status: DC
  Administered 2016-05-31 – 2016-06-02 (×3): 1 g via INTRAVENOUS
  Filled 2016-05-31 (×5): qty 50

## 2016-05-31 NOTE — Progress Notes (Signed)
PULMONARY / CRITICAL CARE MEDICINE   Name: Tracy Barker MRN: [id number] DOB: 1946-03-06    ADMISSION DATE:  05/27/2016 CONSULTATION DATE:  05/27/2016  REFERRING MD:  [facility]  CHIEF COMPLAINT:  MODS/shock  SUBJECTIVE:  Remains extubated Oriented today Sinus rhythm  Remains on vasopressors  VITAL SIGNS: BP 110/62   Pulse 62   Temp 97.6 F (36.4 C) (Axillary)   Resp 15   Ht _0  (1.575 m) Comment: estimated  Wt 184 lb 4.9 oz (83.6 kg)   SpO2 99%   BMI 33.71 kg/m   HEMODYNAMICS: CVP:  [0 mmHg-13 mmHg] 1 mmHg  VENTILATOR SETTINGS: FiO2 (%):  [3 %] 3 %  INTAKE / OUTPUT: I/O last 3 completed shifts: In: 2581.6 [I.V.:1681.6; IV [id number]:622] Out: 2979 [Urine:20; GXQJJ:9417; Stool:75]  PHYSICAL EXAMINATION: General:  Awake, alert HENT: NCAT OP clear PULM: CTA B, normal effort CV: No MGR, RRR GI: BS+, soft, nontender DERM: digital cyanotic changes (finger tips, toes) but normal cap refill Neuro: Awake, alert, oriented to situation  LABS:  BMET  Recent Labs Lab 05/30/16 0500 05/30/16 1741 05/31/16 0315  NA 140 133* 134*  K 4.9 5.4* 4.9  CL 104 100* 102  CO2 _1 BUN 27* 38* 47*  CREATININE 1.45* 2.07* 2.44*  GLUCOSE 181* 458* 168*    Electrolytes  Recent Labs Lab 05/29/16 0320  05/30/16 0500 05/30/16 1741 05/31/16 0315  CALCIUM 7.2*  < > 7.5* 7.3* 7.8*  MG 2.4  --  2.5*  --  2.4  PHOS 4.2  < > 4.1 5.4* 4.8*  < > = values in this interval not displayed.  CBC  Recent Labs Lab 05/30/16 0500 05/30/16 0934 05/31/16 0315  WBC 51.7* 47.5* 48.8*  HGB 10.7* 11.2* 9.0*  HCT 32.6* 33.8* 27.1*  PLT 83* 94* 72*    Coag's  Recent Labs Lab 05/27/16 0520  INR 1.50    Sepsis Markers  Recent Labs Lab 05/27/16 0520 05/27/16 0749  LATICACIDVEN 8.2* 10.8*    ABG  Recent Labs Lab 05/27/16 0550 05/27/16 1108  PHART 7.188* 7.277*  PCO2ART 33.6 28.7*  PO2ART 331* 142.0*    Liver Enzymes  Recent Labs Lab  05/27/16 0520  05/30/16 0500 05/30/16 1741 05/31/16 0315  AST 57*  --   --   --   --   ALT 39  --   --   --   --   ALKPHOS 164*  --   --   --   --   BILITOT 0.9  --   --   --   --   ALBUMIN 1.8*  < > 1.8* 1.6* 1.8*  < > = values in this interval not displayed.  Cardiac Enzymes  Recent Labs Lab 05/28/16 0826 05/28/16 1415 05/28/16 2049  TROPONINI 0.35* 0.32* 0.22*    Glucose  Recent Labs Lab 05/30/16 1158 05/30/16 1630 05/30/16 1709 05/30/16 1959 05/31/16 0025 05/31/16 0310  GLUCAP 166* 413* 384* 508* 225* 154*    Imaging No results found.   STUDIES:  CT head 1/9> non-acute CT abdomen pelvis 1/9 > non-acute, gallbladder distention, colonic wall thickening CT chest 1/9 > bilateral opacities and cavitary lung masses  US Renal 1/11 > no evidence of hydronephrosis; 3 cm left adrenal mass  DG Left Foot >> osteomyelitis and septic arthritis  1/12 TEE>   CULTURES: Blood 1/10 > 2 out of 2 with MSSA Urine 1/10 > 40,000 colonies of yeast (likely insignificant)  Flu 1/10 > negative  RVP 1/10 > +Rhinovirus  Blood 1/11 > Staph aureus  BAL cultures 1/11 >> Moderate staph aureus, moderate candida  Blood 1/13 >> Pending   ANTIBIOTICS: Meropenem 1/9 > 1/11 Linezolid 1/9 >  SIGNIFICANT EVENTS: 1/10 transferred to [facility], intubated, CVVHD  LINES/TUBES: ETT 1/10 >>> R fem CVL 1/10 >>>  DISCUSSION: H/o CLL, poorly controlled DM, admitted w/ septic shock, respiratory failure, AKI in setting of MSSA bacteremia.  May have b-lactam allergy. Remains on pressors, extubated 1/13.  ASSESSMENT / PLAN:  NEUROLOGIC A:   Acute metabolic encephalopathy - improved Left sided weakness> minimal, improving unlikely stroke, more likely related to recent fall on that side P:   Stop sedation protocol MRI Brain after off pressors, HD stable  PULMONARY A: Acute hypoxemic respiratory failure> resolved  CT chest with bilateral opacities and cavitary lung masses - Likely  septic emboli due to bacteremia S/P BAL 1/11 P:   Follow BAL culture RN to assess aspiration risk, then advance diet  CARDIOVASCULAR A:  Shock, septic/MODS> hemodynamics better 1/13, but WBC; no clear source Troponin elevation, no EKG ischemic changes Digital ischemia changes> vasopressors related vs embolic given bacteremia Afib > resolved on amiodarone, some bradycardia 1/13 P:  Tele Monitor for limb ischemia Warm compresses fingers/toes Wean off levophed Vasopressors for MAP > 65 Continue stress dose steroids  RENAL A:   AKI; likely ATN due to sepsis   P:   CVVHD held yesterday; will follow up with renal on further plans for HD  Monitor BMET and UOP Replace electrolytes as needed  GASTROINTESTINAL A:   No acute issues  P:   Tolerating liquid diet - advance diet if not aspiration risk  HEMATOLOGIC A:   Worsening leukocytosis > due to sepsis? Due to sepsis? CLL? Hydrocortisone? P:  Send CBC with diff, smear  INFECTIOUS A:   Leukocytosis - worsening  SEPSIS/bacteremia  - due to chronic osteomyelitis, sacral wound Persistent bacteremia but source osteo toes? TEE negative for vegetation X-ray left foot with osteomyelitis   P:   Linezolid (started at OSH) per ID Appreciate orthopedics will need amputation, agree doesn't need emergently Follow up repeat blood cultures Remove femoral line 1/13, HD line 1/14 if off pressors  ENDOCRINE A:   DM P:   Holding januvia, metformin, levemir SSI Stress dose steroids   FAMILY  - Updates: have updated daughter Sharyn Lull daily, last 1/11  - Inter-disciplinary family meet or Palliative Care meeting due by:  1/17  My ccm time 35 minutes  Velna Ochs, M.D. Pager: [phone] 05/31/2016, 6:36 AM

## 2016-05-31 NOTE — Progress Notes (Signed)
PT Cancellation Note  Patient Details Name: Tracy Barker MRN: [id number] DOB: 11-13-1945   Cancelled Treatment:    Reason Eval/Treat Not Completed: Patient not medically ready (currently with femoral A line). Will continue to follow.   Duncan Dull 05/31/2016, 6:44 AM

## 2016-05-31 NOTE — Evaluation (Signed)
Speech Language Pathology Evaluation Patient Details Name: Tracy Barker MRN: [id number] DOB: 29-Aug-1945 Today's Date: 05/31/2016 Time: [id number] SLP Time Calculation (min) (ACUTE ONLY): 18 min  Problem List:  Patient Active Problem List   Diagnosis Date Noted  . Acute osteomyelitis of left ankle or foot ([place]) 05/29/2016  . Acute respiratory failure with hypoxia ([place])   . Staphylococcus aureus bacteremia with sepsis ([place]) 05/28/2016  . Cavitary pneumonia 05/28/2016  . Skin ulcer of toe of left foot, limited to breakdown of skin ([place]) 05/28/2016  . Shock ([place]) 05/27/2016  . Abdominal pain   . Acute renal failure (ARF) ([place])   . Increased anion gap metabolic acidosis    Past Medical History:  Past Medical History:  Diagnosis Date  . Diabetes mellitus ([place])    Type 2  . Leukemia ([place])    not specified   Past Surgical History: No past surgical history on file. HPI:  This 71 y.o. female transferred from [facility] ED with septic shock  due to staph areus thought to be due to osteomyelitis of Lt second toe - work up underway.  Pt was intubated 05/27/16 >>05/29/16.  Pt wtih Lt intermittent Lt sided weakness upon presentation - Head CT without acute changes.  PMH includes:  CLL, DM, HTN, and h/o falls per H&P she was a "frequent flyer" in ED typically for falls   Assessment / Plan / Recommendation Clinical Impression  Pt presents with mild to moderate cognitive deficits. PLOF includes living alone and hx of falls. 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 Pt oriented x4 though displays cognitive deficits characterized by reduced speed of processing, decreased novel recall, and decreased executive function skills. Recommend ST follow up at next level of care to ensure safety at home with complex ADLs.     SLP Assessment  All further Speech Lanaguage Pathology  needs can be addressed in the next venue of care     Follow Up Recommendations  Skilled Nursing facility    Frequency and Duration           SLP Evaluation Cognition  Overall Cognitive Status: Impaired/Different from baseline Arousal/Alertness: Awake/alert Orientation Level: Oriented X4 Memory: Impaired Memory Impairment: Decreased recall of new information Problem Solving: Impaired Problem Solving Impairment: Functional complex;Verbal complex Executive Function: Organizing;Sequencing Sequencing: Impaired Sequencing Impairment: Verbal complex;Functional complex Organizing: Impaired Safety/Judgment: Impaired       Comprehension  Auditory Comprehension Overall Auditory Comprehension: Appears within functional limits for tasks assessed Visual Recognition/Discrimination Discrimination: Within Function Limits    Expression Verbal Expression Overall Verbal Expression: Appears within functional limits for tasks assessed Written Expression Dominant Hand: Right   Oral / Motor  Oral Motor/Sensory Function Overall Oral Motor/Sensory Function: Within functional limits Motor Speech Overall Motor Speech: Appears within functional limits for tasks assessed   GO                    Arvil Chaco MA, [place] Language Pathologist    Levi Aland 05/31/2016, 8:58 AM

## 2016-05-31 NOTE — Progress Notes (Signed)
  Pharmacy Antibiotic Note  Tracy Barker is a 71 y.o. female admitted on 05/27/2016 with MSSA bacteremia and cavitary PNA.  Pharmacy has been consulted for cefazolin dosing. Patient has zosyn allergy listed and after further discussion with the patient she reports that she got a rash with zosyn. She does state that she has tolerated cephalexin, a first-generation cephalosporin in the past.   CRRT was stopped yesterday, she has had minimal urine output therefore assuming CrCl < 10 ml/min.  Plan: - Start cefazolin 1g IV q24h - Monitor C&S, CBC and clinical progression - Follow up length of treatment and clearance of blood cultures  Height: 5\' 2"  (157.5 cm) (estimated) Weight: 184 lb 4.9 oz (83.6 kg) IBW/kg (Calculated) : 50.1  Temp (24hrs), Avg:97.9 F (36.6 C), Min:97.6 F (36.4 C), Max:98.3 F (36.8 C)   Recent Labs Lab 05/27/16 0520 05/27/16 0749  05/28/16 1930 05/29/16 0320 05/29/16 1600 05/30/16 0500 05/30/16 0934 05/30/16 1741 05/31/16 0315  WBC 33.7*  --   < > 35.1* 47.2*  --  51.7* 47.5*  --  48.8*  CREATININE 6.29*  --   < > 2.51* 1.94* 1.64* 1.45*  --  2.07* 2.44*  LATICACIDVEN 8.2* 10.8*  --   --   --   --   --   --   --   --   < > = values in this interval not displayed.  Estimated Creatinine Clearance: 21.5 mL/min (by C-G formula based on SCr of 2.44 mg/dL (H)).    Allergies  Allergen Reactions  . Zosyn [Piperacillin Sod-Tazobactam So] Rash    Patient reports she has tolerated cephalexin in the past.  . Amlodipine   . Clindamycin/Lincomycin     Antimicrobials this admission: 1/10 zyvox>> 1/14 1/10 meropenem>>1/11 1/14 Ancef >>   Dose adjustments this admission:   Microbiology results: 1/10 Resp panel: Rhino/Enterovirus 1/10 BCx: MSSA 1/10 urine: 40K yeast 1/10 resp: Few GPC in pairs, Few yeast (reincubated) 1/10 MRSA PCR neg 1/11 Trach asp: Mod staph aureus, mod C.albicans 1/11 BCx: MSSA 1/13 BCx: ng<24h  Thank you for allowing pharmacy to be a  part of this patient's care.  Dimitri Ped, PharmD, BCPS PGY-2 Infectious Diseases Pharmacy Resident Pager: [phone] 05/31/2016 1:07 PM

## 2016-05-31 NOTE — Evaluation (Signed)
Clinical/Bedside Swallow Evaluation Patient Details  Name: Tracy Barker MRN: [id number] Date of Birth: 12-19-1945  Today's Date: 05/31/2016 Time: SLP Start Time (ACUTE ONLY): [id number] SLP Stop Time (ACUTE ONLY): 0823 SLP Time Calculation (min) (ACUTE ONLY): 12 min  Past Medical History:  Past Medical History:  Diagnosis Date  . Diabetes mellitus ([place])    Type 2  . Leukemia ([place])    not specified   Past Surgical History: No past surgical history on file. HPI:  This 71 y.o. female transferred from [facility] ED with septic shock  due to staph areus thought to be due to osteomyelitis of Lt second toe - work up underway.  Pt was intubated 05/27/16 >>05/29/16.  Pt wtih Lt intermittent Lt sided weakness upon presentation - Head CT without acute changes.  PMH includes:  CLL, DM, HTN, and h/o falls per H&P she was a "frequent flyer" in ED typically for falls   Assessment / Plan / Recommendation Clinical Impression  Pts swallow presents within functional limits despite recent 2 day intubation with extubation date 1/12. No overt s/sx of aspiration with any consistencies. Vocal intensity noted to be mildly reduced though pt with strong volitional cough. Recommend diet initiation of regular consistencies and thin liquids with medicines whole and standard aspiration precautions. No further swallow needs identified.     Aspiration Risk  Mild aspiration risk    Diet Recommendation   Regular thin liquids  Medication Administration: Whole meds with liquid    Other  Recommendations Oral Care Recommendations: Oral care BID   Follow up Recommendations        Frequency and Duration            Prognosis        Swallow Study   General Date of Onset: 05/27/16 HPI: This 71 y.o. female transferred from [facility] ED with septic shock  due to staph areus thought to be due to osteomyelitis of Lt second toe - work up underway.  Pt was intubated 05/27/16 >>05/29/16.  Pt wtih Lt intermittent Lt sided weakness upon  presentation - Head CT without acute changes.  PMH includes:  CLL, DM, HTN, and h/o falls per H&P she was a "frequent flyer" in ED typically for falls Type of Study: Bedside Swallow Evaluation Previous Swallow Assessment: no prior  Diet Prior to this Study: Other (Comment) (clear liquids) Temperature Spikes Noted: No Respiratory Status: Room air History of Recent Intubation: Yes Length of Intubations (days): 2 days Date extubated: 05/29/16 Behavior/Cognition: Alert;Cooperative;Pleasant mood Oral Cavity Assessment: Within Functional Limits Oral Cavity - Dentition: Other (Comment) (upper and lower paritals with some natural dentition ) Vision: Functional for self-feeding Self-Feeding Abilities: Able to feed self Patient Positioning: Upright in bed Baseline Vocal Quality: Normal Volitional Cough: Strong Volitional Swallow: Able to elicit    Oral/Motor/Sensory Function Overall Oral Motor/Sensory Function: Within functional limits   Ice Chips Ice chips: Within functional limits   Thin Liquid Thin Liquid: Impaired Presentation: Cup;Straw Pharyngeal  Phase Impairments: Suspected delayed Swallow;Multiple swallows    Nectar Thick Nectar Thick Liquid: Not tested   Honey Thick Honey Thick Liquid: Not tested   Puree Puree: Within functional limits   Solid   GO   Solid: Within functional limits       Arvil Chaco MA, CCC-SLP Acute Care Speech Language Pathologist    Levi Aland 05/31/2016,8:35 AM

## 2016-05-31 NOTE — Progress Notes (Signed)
Patient ID: Tracy Barker, female   DOB: February 20, 1946, 71 y.o.   MRN: [id number] [facility] Progress Note   Assessment/ Plan:   1. AKI on CKD stage 3: Clinically suspected ATN associated with MSSA bacteremia/sepsis. Serologies negative for ANCA vasculitis, antinuclear antibody negative and anti-GBM antibody negative. Was on CRRT until yesterday morning when discontinued after she was felt to be close to her dry weight and with permissive labs-unfortunately, she remains oliguric and without clear signs of renal recovery. Today she does not have any acute electrolyte abnormalities, critical volume overload or uremic signs or symptoms to prompt intervention with renal replacement therapy (she remains on pressors and if indicated, will need to go back on CRRT). Her nephrologist up in [place], [place] is [name]. 2. Staphylococcus bacteremia with sepsis: MSSA bacteremia with cavitary pneumonia for which he is on coverage with linezolid-source suspected to be probably from her lower extremity ulcers/osteomyelitis and without any evidence of endocarditis on TEE. Evaluated yesterday by orthopedic surgery and their plans noted. Remains pressor dependent. 3. Mixed anion-gap and non-anion gap acidosis: From acute renal failure/metformin and recent GI losses. Acidosis corrected with CRRT 4. Hyperkalemia:  acute hyperkalemia corrected with CRRT, hyperkalemia from yesterday evening's labs appears to have been from concomitant hyperglycemia 5. Abdominal pain:  no clear evidence of mesenteric ischemia, CT showed intra-abdominal adenopathy/dilated gallbladder.  Subjective:   Hyperglycemia noted overnight with concomitant hyperkalemia-remains pressor dependent and with marginal urine output.    Objective:   BP 111/62   Pulse 66   Temp 98 F (36.7 C) (Oral)   Resp 17   Ht 5\' 2"  (1.575 m) Comment: estimated  Wt 83.6 kg (184 lb 4.9 oz)   SpO2 96%   BMI 33.71 kg/m   Intake/Output Summary (Last 24  hours) at 05/31/16 0811 Last data filed at 05/31/16 0700  Gross per 24 hour  Intake          1385.85 ml  Output              279 ml  Net          1106.85 ml   Weight change: 2.9 kg (6 lb 6.3 oz)  Physical Exam: [id number] resting in bed-awake and alert CVS: Pulse regular rhythm, normal rate, normal S1 and S2 Resp: Clear to auscultation, no rales or rhonchi Abd: Soft, obese, mildly tender right upper quadrant Ext: No lower extremity edema, dusky appearing left toes with scab covered dorsal ulcers   Imaging: No results found.  Labs: BMET  Recent Labs Lab 05/28/16 0321  05/28/16 1531 05/28/16 1930 05/29/16 0320 05/29/16 1600 05/30/16 0500 05/30/16 1741 05/31/16 0315  NA 138  < > 138 138 139 137 140 133* 134*  K 4.4  < > 4.6 4.4 4.6 5.0 4.9 5.4* 4.9  CL 92*  < > 97* 99* 100* 101 104 100* 102  CO2 24  < > 24 25 26 24 23 22 22   GLUCOSE 207*  < > 239* 232* 210* 220* 181* 458* 168*  BUN 77*  < > 55* 47* 36* 30* 27* 38* 47*  CREATININE 3.60*  < > 2.60* 2.51* 1.94* 1.64* 1.45* 2.07* 2.44*  CALCIUM 6.1*  < > 6.9* 6.7* 7.2* 7.4* 7.5* 7.3* 7.8*  PHOS 4.5  --  5.6*  --  4.2 4.3 4.1 5.4* 4.8*  < > = values in this interval not displayed. CBC  Recent Labs Lab 05/27/16 0520  05/29/16 0320 05/30/16 0500 05/30/16 0934 05/31/16 0315  WBC  33.7*  < > 47.2* 51.7* 47.5* 48.8*  NEUTROABS 15.2*  --   --   --  30.4* 19.5*  HGB 11.2*  < > 11.5* 10.7* 11.2* 9.0*  HCT 35.3*  < > 34.3* 32.6* 33.8* 27.1*  MCV 84.9  < > 81.3 82.5 82.8 81.9  PLT 212  < > 114* 83* 94* 72*  < > = values in this interval not displayed. Medications:    .  stroke: mapping our early stages of recovery book   Does not apply Once  . heparin  5,000 Units Subcutaneous Q8H  . hydrocortisone sod succinate (SOLU-CORTEF) inj  50 mg Intravenous Q6H  . insulin aspart  0-20 Units Subcutaneous Q4H  . insulin glargine  30 Units Subcutaneous Q24H  . linezolid (ZYVOX) IV  600 mg Intravenous Q12H  . mouth rinse  15 mL  Mouth Rinse BID  . pantoprazole (PROTONIX) IV  40 mg Intravenous QHS   Elmarie Shiley, MD 05/31/2016, 8:11 AM

## 2016-05-31 NOTE — Progress Notes (Addendum)
  SLP to complete cognitive linguistic assessment Arvil Chaco MA, [facility] Speech Language Pathologist

## 2016-06-01 LAB — CBC WITH DIFFERENTIAL/PLATELET
Basophils Absolute: 0 10*3/uL (ref 0.0–0.1)
Basophils Relative: 0 %
EOS ABS: 0 10*3/uL (ref 0.0–0.7)
Eosinophils Relative: 0 %
HCT: 23 % — ABNORMAL LOW (ref 36.0–46.0)
Hemoglobin: 7.6 g/dL — ABNORMAL LOW (ref 12.0–15.0)
LYMPHS PCT: 67 %
Lymphs Abs: 19.1 10*3/uL — ABNORMAL HIGH (ref 0.7–4.0)
MCH: 27.2 pg (ref 26.0–34.0)
MCHC: 33 g/dL (ref 30.0–36.0)
MCV: 82.4 fL (ref 78.0–100.0)
MONO ABS: 0.3 10*3/uL (ref 0.1–1.0)
Monocytes Relative: 1 %
NEUTROS PCT: 32 %
Neutro Abs: 9.2 10*3/uL — ABNORMAL HIGH (ref 1.7–7.7)
PLATELETS: 40 10*3/uL — AB (ref 150–400)
RBC: 2.79 MIL/uL — AB (ref 3.87–5.11)
RDW: 15 % (ref 11.5–15.5)
WBC: 28.6 10*3/uL — AB (ref 4.0–10.5)

## 2016-06-01 LAB — BASIC METABOLIC PANEL
ANION GAP: 11 (ref 5–15)
BUN: 69 mg/dL — ABNORMAL HIGH (ref 6–20)
CALCIUM: 8.2 mg/dL — AB (ref 8.9–10.3)
CO2: 22 mmol/L (ref 22–32)
Chloride: 102 mmol/L (ref 101–111)
Creatinine, Ser: 3.44 mg/dL — ABNORMAL HIGH (ref 0.44–1.00)
GFR, EST AFRICAN AMERICAN: 15 mL/min — AB (ref >60)
GFR, EST NON AFRICAN AMERICAN: 13 mL/min — AB (ref >60)
Glucose, Bld: 140 mg/dL — ABNORMAL HIGH (ref 65–99)
Potassium: 4.7 mmol/L (ref 3.5–5.1)
Sodium: 135 mmol/L (ref 135–145)

## 2016-06-01 LAB — GLUCOSE, CAPILLARY
GLUCOSE-CAPILLARY: 119 mg/dL — AB (ref 65–99)
GLUCOSE-CAPILLARY: 138 mg/dL — AB (ref 65–99)
GLUCOSE-CAPILLARY: 158 mg/dL — AB (ref 65–99)
GLUCOSE-CAPILLARY: 177 mg/dL — AB (ref 65–99)
Glucose-Capillary: 81 mg/dL (ref 65–99)
Glucose-Capillary: 158 mg/dL — ABNORMAL HIGH (ref 65–99)

## 2016-06-01 LAB — CULTURE, RESPIRATORY: SPECIAL REQUESTS: NORMAL

## 2016-06-01 LAB — CULTURE, RESPIRATORY W GRAM STAIN

## 2016-06-01 MED ORDER — HYDROCORTISONE NA SUCCINATE PF 100 MG IJ SOLR
50.0000 mg | Freq: Two times a day (BID) | INTRAMUSCULAR | Status: DC
  Administered 2016-06-02 (×2): 50 mg via INTRAVENOUS
  Filled 2016-06-01 (×4): qty 2
  Filled 2016-06-01 (×2): qty 1

## 2016-06-01 NOTE — Progress Notes (Signed)
PULMONARY / CRITICAL CARE MEDICINE   Name: Tracy Barker MRN: [id number] DOB: 05/09/1946    ADMISSION DATE:  05/27/2016 CONSULTATION DATE:  05/27/2016  REFERRING MD:  [facility] EDP  CHIEF COMPLAINT:  MODS/shock  SUBJECTIVE:  Remains extubated Oriented today Sinus rhythm  Off vasopressors  VITAL SIGNS: BP (!) 89/42   Pulse 65   Temp 97.6 F (36.4 C) (Oral)   Resp 14   Ht _0  (1.575 m) Comment: estimated  Wt 183 lb 6.8 oz (83.2 kg)   SpO2 98%   BMI 33.55 kg/m   HEMODYNAMICS: CVP:  [3 mmHg-28 mmHg] 5 mmHg  VENTILATOR SETTINGS:    INTAKE / OUTPUT: I/O last 3 completed shifts: In: 1189.5 [P.O.:60; I.V.:749.5; Other:30; IV Piggyback:350] Out: 35 [Urine:317; Stool:175]  PHYSICAL EXAMINATION: General:  Awake, alert HENT: NCAT OP clear PULM: CTA B, normal effort CV: No MGR, RRR GI: BS+, soft, nontender DERM: digital cyanotic changes (finger tips, toes) but normal cap refill Neuro: Awake, alert, oriented to situation  LABS:  BMET  Recent Labs Lab 05/30/16 1741 05/31/16 0315 06/01/16 0300  NA 133* 134* 135  K 5.4* 4.9 4.7  CL 100* 102 102  CO2 _1 BUN 38* 47* 69*  CREATININE 2.07* 2.44* 3.44*  GLUCOSE 458* 168* 140*    Electrolytes  Recent Labs Lab 05/29/16 0320  05/30/16 0500 05/30/16 1741 05/31/16 0315 06/01/16 0300  CALCIUM 7.2*  < > 7.5* 7.3* 7.8* 8.2*  MG 2.4  --  2.5*  --  2.4  --   PHOS 4.2  < > 4.1 5.4* 4.8*  --   < > = values in this interval not displayed.  CBC  Recent Labs Lab 05/30/16 0934 05/31/16 0315 06/01/16 0300  WBC 47.5* 48.8* 28.6*  HGB 11.2* 9.0* 7.6*  HCT 33.8* 27.1* 23.0*  PLT 94* 72* 40*    Coag's  Recent Labs Lab 05/27/16 0520  INR 1.50    Sepsis Markers  Recent Labs Lab 05/27/16 0520 05/27/16 0749  LATICACIDVEN 8.2* 10.8*    ABG  Recent Labs Lab 05/27/16 0550 05/27/16 1108  PHART 7.188* 7.277*  PCO2ART 33.6 28.7*  PO2ART 331* 142.0*    Liver Enzymes  Recent  Labs Lab 05/27/16 0520  05/30/16 0500 05/30/16 1741 05/31/16 0315  AST 57*  --   --   --   --   ALT 39  --   --   --   --   ALKPHOS 164*  --   --   --   --   BILITOT 0.9  --   --   --   --   ALBUMIN 1.8*  < > 1.8* 1.6* 1.8*  < > = values in this interval not displayed.  Cardiac Enzymes  Recent Labs Lab 05/28/16 0826 05/28/16 1415 05/28/16 2049  TROPONINI 0.35* 0.32* 0.22*    Glucose  Recent Labs Lab 05/31/16 0758 05/31/16 1209 05/31/16 1608 05/31/16 2019 06/01/16 0028 06/01/16 0313  GLUCAP 144* 169* 234* 231* 177* 138*    Imaging No results found.   STUDIES:  CT head 1/9> non-acute CT abdomen pelvis 1/9 > non-acute, gallbladder distention, colonic wall thickening CT chest 1/9 > bilateral opacities and cavitary lung masses  US Renal 1/11 > no evidence of hydronephrosis; 3 cm left adrenal mass  DG Left Foot >> osteomyelitis and septic arthritis  1/12 TEE>   CULTURES: Blood 1/10 > MSSA  Urine 1/10 > 40,000 colonies of yeast (likely insignificant)  Flu 1/10 >  negative  RVP 1/10 > +Rhinovirus  Blood 1/11 > MSSA BAL cultures 1/11 >> Moderate staph aureus, moderate candida  Blood 1/13 >> NG < 24 hours  ANTIBIOTICS: Meropenem 1/9 > 1/11 Linezolid 1/9 > 1/14 Ancef 1/14 >>   SIGNIFICANT EVENTS: 1/10 transferred to [facility], intubated, CVVHD  LINES/TUBES: ETT 1/10 >> 1/12 R fem CVL 1/10 >>>  DISCUSSION: H/o CLL, poorly controlled DM, admitted w/ septic shock, respiratory failure, AKI in setting of MSSA bacteremia.  May have b-lactam allergy. Remains on pressors, extubated 1/13.  ASSESSMENT / PLAN:  NEUROLOGIC A:   No current issues  Acute metabolic encephalopathy - Resolved  Left sided weakness> minimal, improving unlikely stroke, more likely related to recent fall on that side P:   Stop sedation protocol  PULMONARY A: Acute hypoxemic respiratory failure> resolved  CT chest with bilateral opacities and cavitary lung masses - Likely septic  emboli due to bacteremia S/P BAL 1/11 >> cultures with staph aureus  P:   Successfully extubated 1/12  CARDIOVASCULAR A:  Shock, septic/MODS> hemodynamics improving,  Troponin elevation, no EKG ischemic changes Digital ischemia changes> vasopressors related vs embolic given bacteremia Afib > resolved on amiodarone, some bradycardia 1/13 P:  Tele Amio drip - off  Off levophed Goal MAP > 65 Monitor for limb ischemia Warm compresses fingers/toes Continue stress dose steroids  RENAL A:   AKI; likely ATN due to sepsis   P:   CVVHD held 1/13; will follow up with renal on further plans for HD  No evidence of renal recovery thus far Follow BMET and UOP Replace electrolytes as needed  GASTROINTESTINAL A:   No acute issues P:   Appreciate SLP evaluation  Diet per speech recs >> Liquid diet   HEMATOLOGIC A:   leukocytosis > likely due to sepsis, however also with hx of CLL. Smear neg for blast. Also stress dose steroids. Improving today.  Thrombocytopenia > slow declind P:  Follow CBC Stopped linezolid; started Ancef 1/14 HIT antibodies pending; low risk, will continue heparin for now   INFECTIOUS A:   Leukocytosis - improving  SEPSIS/bacteremia  - due to chronic osteomyelitis of L toes TEE negative for vegetation X-ray left foot with osteomyelitis  P:   Linezolid changed to Ancef 1/14 Appreciate orthopedics will need amputation, agree doesn't need emergently Follow up repeat blood cultures Remove femoral line 1/13, HD line if off pressors   ENDOCRINE A:   DM P:   Holding januvia, metformin, levemir SSI Stress dose steroids   FAMILY  - Updates: have updated daughter Sharyn Lull daily, last 1/14  - Inter-disciplinary family meet or Palliative Care meeting due by:  1/17  My ccm time 35 minutes  Velna Ochs, M.D. Pager: [phone] 06/01/2016, 7:07 AM

## 2016-06-01 NOTE — Progress Notes (Signed)
Patient ID: Tracy Barker, female   DOB: 1945/09/29, 71 y.o.   MRN: [id number]          [facility]  Date of Admission:  05/27/2016   Total days of antibiotics 6        Day 2 cefazolin         Principal Problem:   Staphylococcus aureus bacteremia with sepsis [facility]) Active Problems:   Cavitary pneumonia   Skin ulcer of toe of left foot, limited to breakdown of skin ([place])   Acute osteomyelitis of left ankle or foot (HCC)   Shock ([place])   Abdominal pain   Acute renal failure (ARF) (HCC)   Increased anion gap metabolic acidosis   Acute respiratory failure with hypoxia ([place])   .  stroke: mapping our early stages of recovery book   Does not apply Once  .  ceFAZolin (ANCEF) IV  1 g Intravenous Q24H  . hydrocortisone sod succinate (SOLU-CORTEF) inj  50 mg Intravenous Q12H  . insulin aspart  0-20 Units Subcutaneous Q4H  . insulin glargine  30 Units Subcutaneous Q24H  . mouth rinse  15 mL Mouth Rinse BID    SUBJECTIVE: She is feeling much better. She tells me that she injured her toes on her left foot while wearing some boots about one month ago.  Review of Systems: Review of Systems  Constitutional: Positive for malaise/fatigue. Negative for chills, diaphoresis and fever.  Musculoskeletal: Negative for joint pain.    Past Medical History:  Diagnosis Date  . Diabetes mellitus ([place])    Type 2  . Leukemia ([place])    not specified    Social History  Substance Use Topics  . Smoking status: Not on file  . Smokeless tobacco: Not on file  . Alcohol use Not on file    No family history on file. Allergies  Allergen Reactions  . Zosyn [Piperacillin Sod-Tazobactam So] Rash    Patient reports she has tolerated cephalexin in the past.  . Amlodipine   . Clindamycin/Lincomycin     OBJECTIVE: Vitals:   06/01/16 0808 06/01/16 0830 06/01/16 0900 06/01/16 0930  BP:  (!) 117/57 (!) 111/45 (!) 101/52  Pulse:  76 84 72  Resp:  (!) 21 19 13   Temp: 97.5 F  (36.4 C)     TempSrc: Oral     SpO2:  97% 98% 100%  Weight:      Height:       Body mass index is 33.55 kg/m.  Physical Exam  Constitutional:  She is more alert. She is in no distress sitting up in a chair.  Musculoskeletal:  The toes on her left foot are less swollen. The ulcers with dry eschars on the dorsum of her second, third and fifth toes are unchanged.  Skin: No rash noted.    Lab Results Lab Results  Component Value Date   WBC 28.6 (H) 06/01/2016   HGB 7.6 (L) 06/01/2016   HCT 23.0 (L) 06/01/2016   MCV 82.4 06/01/2016   PLT 40 (L) 06/01/2016    Lab Results  Component Value Date   CREATININE 3.44 (H) 06/01/2016   BUN 69 (H) 06/01/2016   NA 135 06/01/2016   K 4.7 06/01/2016   CL 102 06/01/2016   CO2 22 06/01/2016    Lab Results  Component Value Date   ALT 39 05/27/2016   AST 57 (H) 05/27/2016   ALKPHOS 164 (H) 05/27/2016   BILITOT 0.9 05/27/2016     Microbiology:  Recent Results (from the past 240 hour(s))  MRSA PCR Screening     Status: None   Collection Time: 05/27/16  5:27 AM  Result Value Ref Range Status   MRSA by PCR NEGATIVE NEGATIVE Final    Comment:        The GeneXpert MRSA Assay (FDA approved for NASAL specimens only), is one component of a comprehensive MRSA colonization surveillance program. It is not intended to diagnose MRSA infection nor to guide or monitor treatment for MRSA infections.   Culture, Urine     Status: Abnormal   Collection Time: 05/27/16  8:04 AM  Result Value Ref Range Status   Specimen Description URINE, CATHETERIZED  Final   Special Requests NONE  Final   Culture 40,000 COLONIES/mL YEAST (A)  Final   Report Status 05/28/2016 FINAL  Final  Respiratory Panel by PCR     Status: Abnormal   Collection Time: 05/27/16  8:04 AM  Result Value Ref Range Status   Adenovirus NOT DETECTED NOT DETECTED Final   Coronavirus 229E NOT DETECTED NOT DETECTED Final   Coronavirus HKU1 NOT DETECTED NOT DETECTED Final    Coronavirus NL63 NOT DETECTED NOT DETECTED Final   Coronavirus OC43 NOT DETECTED NOT DETECTED Final   Metapneumovirus NOT DETECTED NOT DETECTED Final   Rhinovirus / Enterovirus DETECTED (A) NOT DETECTED Final   Influenza A NOT DETECTED NOT DETECTED Final   Influenza B NOT DETECTED NOT DETECTED Final   Parainfluenza Virus 1 NOT DETECTED NOT DETECTED Final   Parainfluenza Virus 2 NOT DETECTED NOT DETECTED Final   Parainfluenza Virus 3 NOT DETECTED NOT DETECTED Final   Parainfluenza Virus 4 NOT DETECTED NOT DETECTED Final   Respiratory Syncytial Virus NOT DETECTED NOT DETECTED Final   Bordetella pertussis NOT DETECTED NOT DETECTED Final   Chlamydophila pneumoniae NOT DETECTED NOT DETECTED Final   Mycoplasma pneumoniae NOT DETECTED NOT DETECTED Final  Culture, blood (Routine X 2) w Reflex to ID Panel     Status: Abnormal   Collection Time: 05/27/16  9:05 AM  Result Value Ref Range Status   Specimen Description BLOOD RIGHT HAND  Final   Special Requests IN PEDIATRIC BOTTLE 1CC  Final   Culture  Setup Time   Final    GRAM POSITIVE COCCI IN CLUSTERS IN PEDIATRIC BOTTLE CRITICAL RESULT CALLED TO, READ BACK BY AND VERIFIED WITH: CARON AMEND,PHARMD @0252  05/28/16 MKELLY,MLT    Culture STAPHYLOCOCCUS AUREUS (A)  Final   Report Status 05/30/2016 FINAL  Final   Organism ID, Bacteria STAPHYLOCOCCUS AUREUS  Final      Susceptibility   Staphylococcus aureus - MIC*    CIPROFLOXACIN <=0.5 SENSITIVE Sensitive     ERYTHROMYCIN >=8 RESISTANT Resistant     GENTAMICIN <=0.5 SENSITIVE Sensitive     OXACILLIN 0.5 SENSITIVE Sensitive     TETRACYCLINE <=1 SENSITIVE Sensitive     VANCOMYCIN <=0.5 SENSITIVE Sensitive     TRIMETH/SULFA <=10 SENSITIVE Sensitive     CLINDAMYCIN <=0.25 SENSITIVE Sensitive     RIFAMPIN <=0.5 SENSITIVE Sensitive     Inducible Clindamycin NEGATIVE Sensitive     * STAPHYLOCOCCUS AUREUS  Blood Culture ID Panel (Reflexed)     Status: Abnormal   Collection Time: 05/27/16  9:05 AM    Result Value Ref Range Status   Enterococcus species NOT DETECTED NOT DETECTED Final   Vancomycin resistance NOT DETECTED NOT DETECTED Final   Listeria monocytogenes NOT DETECTED NOT DETECTED Final   Staphylococcus species DETECTED (A) NOT DETECTED Final  Comment: CRITICAL RESULT CALLED TO, READ BACK BY AND VERIFIED WITH: CARON AMEND, PHARMD @0252  05/28/16 MKELLY,MLT    Staphylococcus aureus DETECTED (A) NOT DETECTED Final    Comment: CRITICAL RESULT CALLED TO, READ BACK BY AND VERIFIED WITH: CARON AMEND,PHARMD @0252  05/28/16 MKELLY,MLT    Methicillin resistance NOT DETECTED NOT DETECTED Final   Streptococcus species NOT DETECTED NOT DETECTED Final   Streptococcus agalactiae NOT DETECTED NOT DETECTED Final   Streptococcus pneumoniae NOT DETECTED NOT DETECTED Final   Streptococcus pyogenes NOT DETECTED NOT DETECTED Final   Acinetobacter baumannii NOT DETECTED NOT DETECTED Final   Enterobacteriaceae species NOT DETECTED NOT DETECTED Final   Enterobacter cloacae complex NOT DETECTED NOT DETECTED Final   Escherichia coli NOT DETECTED NOT DETECTED Final   Klebsiella oxytoca NOT DETECTED NOT DETECTED Final   Klebsiella pneumoniae NOT DETECTED NOT DETECTED Final   Proteus species NOT DETECTED NOT DETECTED Final   Serratia marcescens NOT DETECTED NOT DETECTED Final   Carbapenem resistance NOT DETECTED NOT DETECTED Final   Haemophilus influenzae NOT DETECTED NOT DETECTED Final   Neisseria meningitidis NOT DETECTED NOT DETECTED Final   Pseudomonas aeruginosa NOT DETECTED NOT DETECTED Final   Candida albicans NOT DETECTED NOT DETECTED Final   Candida glabrata NOT DETECTED NOT DETECTED Final   Candida krusei NOT DETECTED NOT DETECTED Final   Candida parapsilosis NOT DETECTED NOT DETECTED Final   Candida tropicalis NOT DETECTED NOT DETECTED Final  Culture, blood (Routine X 2) w Reflex to ID Panel     Status: Abnormal   Collection Time: 05/27/16  9:12 AM  Result Value Ref Range Status    Specimen Description BLOOD CENTRAL LINE  Final   Special Requests IN PEDIATRIC BOTTLE 1CC  Final   Culture  Setup Time   Final    GRAM POSITIVE COCCI IN CLUSTERS IN PEDIATRIC BOTTLE CRITICAL VALUE NOTED.  VALUE IS CONSISTENT WITH PREVIOUSLY REPORTED AND CALLED VALUE.    Culture (A)  Final    STAPHYLOCOCCUS AUREUS SUSCEPTIBILITIES PERFORMED ON PREVIOUS CULTURE WITHIN THE LAST 5 DAYS.    Report Status 05/30/2016 FINAL  Final  Culture, blood (routine x 2)     Status: Abnormal   Collection Time: 05/28/16 12:00 PM  Result Value Ref Range Status   Specimen Description BLOOD RIGHT HAND  Final   Special Requests IN PEDIATRIC BOTTLE  .[employer]  Final   Culture  Setup Time   Final    GRAM POSITIVE COCCI IN CLUSTERS IN PEDIATRIC BOTTLE CRITICAL VALUE NOTED.  VALUE IS CONSISTENT WITH PREVIOUSLY REPORTED AND CALLED VALUE. CRITICAL RESULT CALLED TO, READ BACK BY AND VERIFIED WITH: CARON AMEND,PHARMD @0710  05/29/16 MKELLY,MLT    Culture (A)  Final    STAPHYLOCOCCUS AUREUS SUSCEPTIBILITIES PERFORMED ON PREVIOUS CULTURE WITHIN THE LAST 5 DAYS.    Report Status 05/30/2016 FINAL  Final  Culture, respiratory (NON-Expectorated)     Status: None (Preliminary result)   Collection Time: 05/28/16 12:58 PM  Result Value Ref Range Status   Specimen Description TRACHEAL ASPIRATE  Final   Special Requests Normal  Final   Gram Stain   Final    ABUNDANT WBC PRESENT,BOTH PMN AND MONONUCLEAR FEW GRAM POSITIVE COCCI IN PAIRS FEW YEAST    Culture   Final    MODERATE STAPHYLOCOCCUS AUREUS MODERATE CANDIDA ALBICANS SUSCEPTIBILITIES TO FOLLOW    Report Status PENDING  Incomplete  Culture, blood (Routine X 2) w Reflex to ID Panel     Status: None (Preliminary result)   Collection Time: 05/30/16  9:33 AM  Result Value Ref Range Status   Specimen Description BLOOD RIGHT ANTECUBITAL  Final   Special Requests BOTTLES DRAWN AEROBIC AND ANAEROBIC 5CC EA  Final   Culture NO GROWTH < 24 HOURS  Final   Report Status  PENDING  Incomplete  Culture, blood (Routine X 2) w Reflex to ID Panel     Status: None (Preliminary result)   Collection Time: 05/30/16  9:41 AM  Result Value Ref Range Status   Specimen Description BLOOD LEFT ANTECUBITAL  Final   Special Requests BOTTLES DRAWN AEROBIC AND ANAEROBIC 8CC EA  Final   Culture NO GROWTH < 24 HOURS  Final   Report Status PENDING  Incomplete     ASSESSMENT: She has staph bacteremia and pneumonia complicating left foot osteomyelitis. Optimal duration of antibiotic therapy will depend on whether or not she undergoes amputation of her toes. She is tolerating the switch to cefazolin.  PLAN: 1. Continue cefazolin 2. Await results of arterial Doppler studies 3. Await follow-up by orthopedics  Michel Bickers, MD [facility] for [place] [phone] pager   [phone] cell 06/01/2016, 10:15 AM

## 2016-06-01 NOTE — Progress Notes (Signed)
Orthopedic Tech Progress Note Patient Details:  Tracy Barker [birth year] [id number]  Ortho Devices Type of Ortho Device: Postop shoe/boot Ortho Device/Splint Location: Applied Post Op Shoe to Left foot.  Pt tolerated well.  Ortho Device/Splint Interventions: Application, Adjustment   Kristopher Oppenheim 06/01/2016, 3:24 PM

## 2016-06-01 NOTE — Progress Notes (Addendum)
Patient ID: Tracy Barker, female   DOB: 07/02/1945, 71 y.o.   MRN: [id number] [facility] Progress Note   Assessment/ Plan:   1. AKI on CKD stage 3: Clinically suspected ATN associated with MSSA bacteremia/sepsis. Serologies negative for ANCA vasculitis, antinuclear antibody negative and anti-GBM antibody negative. Off CRRT and pressors but still oliguric.  Pressures are still a little soft- would give a Lasix challenge if these improve.  No indication for HD today but may need intermittent IHD going forward if urine output doesn't improve/ labs continue to show no signs of recovery. Her nephrologist up in [place], [place] is [name]. 2. Staphylococcus bacteremia with sepsis: MSSA bacteremia with cavitary pneumonia for which he is on coverage with linezolid-source suspected to be probably from her lower extremity ulcers/osteomyelitis and without any evidence of endocarditis on TEE. Evaluated yesterday by orthopedic surgery and their plans noted. Remains pressor dependent. 3. Mixed anion-gap and non-anion gap acidosis: From acute renal failure/metformin and recent GI losses. Acidosis corrected  4. Hyperkalemia:  acute hyperkalemia corrected with CRRT, hyperkalemia from yesterday evening's labs appears to have been from concomitant hyperglycemia 5. Abdominal pain:  no clear evidence of mesenteric ischemia, CT showed intra-abdominal adenopathy/dilated gallbladder.  Subjective:   Off pressors, WBC ct coming down, still remains oliguric with soft pressures   Objective:   BP (!) 105/51 (BP Location: Right Arm)   Pulse 75   Temp 97.5 F (36.4 C) (Oral)   Resp 17   Ht 5\' 2"  (1.575 m) Comment: estimated  Wt 83.2 kg (183 lb 6.8 oz)   SpO2 97%   BMI 33.55 kg/m   Intake/Output Summary (Last 24 hours) at 06/01/16 0811 Last data filed at 06/01/16 0800  Gross per 24 hour  Intake           478.55 ml  Output              450 ml  Net            28.55 ml   Weight change: -0.4 kg (-14.1  oz)  Physical Exam: [id number] resting in bed-awake and alert CVS: Pulse regular rhythm, normal rate, normal S1 and S2 Resp: Clear to auscultation, no rales or rhonchi Abd: Soft, obese, mildly tender right upper quadrant Ext: No lower extremity edema, dusky appearing left toes with scab covered dorsal ulcers   Imaging: No results found.  Labs: BMET  Recent Labs Lab 05/28/16 0321  05/28/16 1531 05/28/16 1930 05/29/16 0320 05/29/16 1600 05/30/16 0500 05/30/16 1741 05/31/16 0315 06/01/16 0300  NA 138  < > 138 138 139 137 140 133* 134* 135  K 4.4  < > 4.6 4.4 4.6 5.0 4.9 5.4* 4.9 4.7  CL 92*  < > 97* 99* 100* 101 104 100* 102 102  CO2 24  < > 24 25 26 24 23 22 22 22   GLUCOSE 207*  < > 239* 232* 210* 220* 181* 458* 168* 140*  BUN 77*  < > 55* 47* 36* 30* 27* 38* 47* 69*  CREATININE 3.60*  < > 2.60* 2.51* 1.94* 1.64* 1.45* 2.07* 2.44* 3.44*  CALCIUM 6.1*  < > 6.9* 6.7* 7.2* 7.4* 7.5* 7.3* 7.8* 8.2*  PHOS 4.5  --  5.6*  --  4.2 4.3 4.1 5.4* 4.8*  --   < > = values in this interval not displayed. CBC  Recent Labs Lab 05/27/16 0520  05/30/16 0500 05/30/16 0934 05/31/16 0315 06/01/16 0300  WBC 33.7*  < > 51.7* 47.5* 48.8*  28.6*  NEUTROABS 15.2*  --   --  30.4* 19.5* 9.2*  HGB 11.2*  < > 10.7* 11.2* 9.0* 7.6*  HCT 35.3*  < > 32.6* 33.8* 27.1* 23.0*  MCV 84.9  < > 82.5 82.8 81.9 82.4  PLT 212  < > 83* 94* 72* 40*  < > = values in this interval not displayed. Medications:    .  stroke: mapping our early stages of recovery book   Does not apply Once  .  ceFAZolin (ANCEF) IV  1 g Intravenous Q24H  . heparin  5,000 Units Subcutaneous Q8H  . hydrocortisone sod succinate (SOLU-CORTEF) inj  50 mg Intravenous Q6H  . insulin aspart  0-20 Units Subcutaneous Q4H  . insulin glargine  30 Units Subcutaneous Q24H  . mouth rinse  15 mL Mouth Rinse BID   Madelon Lips, MD 06/01/2016, 8:11 AM

## 2016-06-01 NOTE — Evaluation (Signed)
Physical Therapy Evaluation Patient Details Name: Tracy Barker MRN: [id number] DOB: 10-22-1945 Today's Date: 06/01/2016   History of Present Illness  This 71 y.o. female transferred from [facility] ED with septic shock  due to staph areus thought to be due to osteomyelitis of Lt second toe .  Pt was intubated 05/27/16 >>05/29/16.  PMH includes:  CLL, DM, HTN, and h/o falls per H&P she was a "frequent flyer" in ED typically for falls    Clinical Impression  Pt pleasant, in chair on arrival with report of no pain in left foot. Pt with decreased gait distance stating fatigue and that her knees give out on her. Pt with generalized weakness, decreased ability with transfers, gait and balance without caregiver assist at home who will benefit from acute therapy to maximize mobility, balance, gait and function to decrease burden of care and fall risk. Post op shoe for LLE may assist with gait to support foot and keep sock from rubbing.     Follow Up Recommendations SNF;Supervision/Assistance - 24 hour    Equipment Recommendations  None recommended by PT    Recommendations for Other Services       Precautions / Restrictions Precautions Precautions: Fall      Mobility  Bed Mobility               General bed mobility comments: in chair on arrival  Transfers Overall transfer level: Needs assistance   Transfers: Sit to/from Stand Sit to Stand: Min assist         General transfer comment: cues for hand placement and safety  Ambulation/Gait Ambulation/Gait assistance: Min assist;+2 safety/equipment Ambulation Distance (Feet): 35 Feet Assistive device: Rolling walker (2 wheeled) Gait Pattern/deviations: Step-through pattern;Decreased stride length;Trunk flexed   Gait velocity interpretation: Below normal speed for age/gender General Gait Details: cues for posture, position in RW and safety with chair to follow. Pt walked 10', 35' respectively with seated rest between  trials  Stairs            Wheelchair Mobility    Modified Rankin (Stroke Patients Only)       Balance Overall balance assessment: Needs assistance   Sitting balance-Leahy Scale: Good       Standing balance-Leahy Scale: Poor                               Pertinent Vitals/Pain Pain Assessment: No/denies pain    Home Living Family/patient expects to be discharged to:: Private residence Living Arrangements: Alone Available Help at Discharge: Family;Available PRN/intermittently Type of Home: House       Home Layout: Two level;Laundry or work area in [place]: Environmental consultant - 2 wheels;Cane - single point;Wheelchair - manual      Prior Function Level of Independence: Independent         Comments: pt reports she cares for herself and drives     Hand Dominance        Extremity/Trunk Assessment   Upper Extremity Assessment Upper Extremity Assessment: Defer to OT evaluation    Lower Extremity Assessment Lower Extremity Assessment: Generalized weakness    Cervical / Trunk Assessment Cervical / Trunk Assessment: Kyphotic  Communication   Communication: No difficulties  Cognition Arousal/Alertness: Awake/alert Behavior During Therapy: WFL for tasks assessed/performed Overall Cognitive Status: Within Functional Limits for tasks assessed Area of Impairment: Safety/judgement         Safety/Judgement: Decreased awareness of safety     General Comments:  pt with tendency to sit prior to backing to surface    General Comments      Exercises     Assessment/Plan    PT Assessment Patient needs continued PT services  PT Problem List Decreased strength;Decreased mobility;Decreased safety awareness;Decreased activity tolerance;Decreased balance;Decreased knowledge of use of DME;Impaired sensation;Decreased skin integrity          PT Treatment Interventions Gait training;Therapeutic exercise;Patient/family education;DME  instruction;Therapeutic activities;Stair training;Functional mobility training;Balance training    PT Goals (Current goals can be found in the Care Plan section)  Acute Rehab PT Goals Patient Stated Goal: to get home PT Goal Formulation: With patient Time For Goal Achievement: 06/15/16 Potential to Achieve Goals: Good    Frequency Min 3X/week   Barriers to discharge Decreased caregiver support      Co-evaluation               End of Session Equipment Utilized During Treatment: Gait belt Activity Tolerance: Patient tolerated treatment well Patient left: in chair;with call bell/phone within reach;with chair alarm set Nurse Communication: Mobility status         Time: [id number] PT Time Calculation (min) (ACUTE ONLY): 16 min   Charges:   PT Evaluation $PT Eval Moderate Complexity: 1 Procedure     PT G Codes:        Atwood Adcock B Messiah Rovira 2016-06-05, 1:40 PM Elwyn Reach, [place]

## 2016-06-02 ENCOUNTER — Inpatient Hospital Stay (HOSPITAL_COMMUNITY): Payer: Medicare Other

## 2016-06-02 DIAGNOSIS — M869 Osteomyelitis, unspecified: Secondary | ICD-10-CM

## 2016-06-02 DIAGNOSIS — B9689 Other specified bacterial agents as the cause of diseases classified elsewhere: Secondary | ICD-10-CM

## 2016-06-02 DIAGNOSIS — L97521 Non-pressure chronic ulcer of other part of left foot limited to breakdown of skin: Secondary | ICD-10-CM

## 2016-06-02 LAB — BLOOD CULTURE ID PANEL (REFLEXED)
Acinetobacter baumannii: NOT DETECTED
CANDIDA ALBICANS: NOT DETECTED
CANDIDA PARAPSILOSIS: NOT DETECTED
CANDIDA TROPICALIS: NOT DETECTED
Candida glabrata: NOT DETECTED
Candida krusei: NOT DETECTED
ENTEROBACTERIACEAE SPECIES: NOT DETECTED
Enterobacter cloacae complex: NOT DETECTED
Enterococcus species: NOT DETECTED
Escherichia coli: NOT DETECTED
HAEMOPHILUS INFLUENZAE: NOT DETECTED
KLEBSIELLA PNEUMONIAE: NOT DETECTED
Klebsiella oxytoca: NOT DETECTED
Listeria monocytogenes: NOT DETECTED
METHICILLIN RESISTANCE: NOT DETECTED
Neisseria meningitidis: NOT DETECTED
Proteus species: NOT DETECTED
Pseudomonas aeruginosa: NOT DETECTED
SERRATIA MARCESCENS: NOT DETECTED
STREPTOCOCCUS PYOGENES: NOT DETECTED
Staphylococcus aureus (BCID): DETECTED — AB
Staphylococcus species: DETECTED — AB
Streptococcus agalactiae: NOT DETECTED
Streptococcus pneumoniae: NOT DETECTED
Streptococcus species: NOT DETECTED

## 2016-06-02 LAB — CBC
HCT: 24.3 % — ABNORMAL LOW (ref 36.0–46.0)
Hemoglobin: 8 g/dL — ABNORMAL LOW (ref 12.0–15.0)
MCH: 27.4 pg (ref 26.0–34.0)
MCHC: 32.9 g/dL (ref 30.0–36.0)
MCV: 83.2 fL (ref 78.0–100.0)
PLATELETS: 35 10*3/uL — AB (ref 150–400)
RBC: 2.92 MIL/uL — ABNORMAL LOW (ref 3.87–5.11)
RDW: 15.1 % (ref 11.5–15.5)
WBC: 40.6 10*3/uL — ABNORMAL HIGH (ref 4.0–10.5)

## 2016-06-02 LAB — BASIC METABOLIC PANEL
Anion gap: 11 (ref 5–15)
BUN: 86 mg/dL — AB (ref 6–20)
CHLORIDE: 103 mmol/L (ref 101–111)
CO2: 20 mmol/L — AB (ref 22–32)
CREATININE: 3.57 mg/dL — AB (ref 0.44–1.00)
Calcium: 8.1 mg/dL — ABNORMAL LOW (ref 8.9–10.3)
GFR calc Af Amer: 14 mL/min — ABNORMAL LOW (ref >60)
GFR calc non Af Amer: 12 mL/min — ABNORMAL LOW (ref >60)
Glucose, Bld: 108 mg/dL — ABNORMAL HIGH (ref 65–99)
Potassium: 4.8 mmol/L (ref 3.5–5.1)
Sodium: 134 mmol/L — ABNORMAL LOW (ref 135–145)

## 2016-06-02 LAB — GLUCOSE, CAPILLARY
GLUCOSE-CAPILLARY: 115 mg/dL — AB (ref 65–99)
GLUCOSE-CAPILLARY: 107 mg/dL — AB (ref 65–99)
GLUCOSE-CAPILLARY: 164 mg/dL — AB (ref 65–99)
GLUCOSE-CAPILLARY: 294 mg/dL — AB (ref 65–99)
Glucose-Capillary: 126 mg/dL — ABNORMAL HIGH (ref 65–99)
Glucose-Capillary: 79 mg/dL (ref 65–99)

## 2016-06-02 LAB — HEPARIN INDUCED PLATELET AB (HIT ANTIBODY): Heparin Induced Plt Ab: 0.088 OD (ref 0.000–0.400)

## 2016-06-02 MED ORDER — FUROSEMIDE 10 MG/ML IJ SOLN
40.0000 mg | Freq: Once | INTRAMUSCULAR | Status: AC
  Administered 2016-06-02: 40 mg via INTRAVENOUS
  Filled 2016-06-02: qty 4

## 2016-06-02 MED ORDER — NEPRO/CARBSTEADY PO LIQD
237.0000 mL | Freq: Two times a day (BID) | ORAL | Status: DC
  Administered 2016-06-02 – 2016-06-10 (×11): 237 mL via ORAL

## 2016-06-02 MED ORDER — MORPHINE SULFATE (PF) 2 MG/ML IV SOLN
1.0000 mg | INTRAVENOUS | Status: DC | PRN
  Administered 2016-06-02 – 2016-06-04 (×2): 1 mg via INTRAVENOUS
  Filled 2016-06-02 (×2): qty 1

## 2016-06-02 MED ORDER — WHITE PETROLATUM GEL
Status: AC
  Administered 2016-06-02: 02:00:00
  Filled 2016-06-02: qty 1

## 2016-06-02 NOTE — Progress Notes (Signed)
Instructed pt on procedure. HOB less than 45 degrees. Pt held breath upon line removal. Pressure held 10 min. No s/sx of bleeding at this time. Instructed pt to report any s/sx of bleeding and to remain in bed for 30 min and to leave pressure drsg CDI for 24 hours. Pt VU. Fran Lowes, RN VAST

## 2016-06-02 NOTE — Clinical Social Work Note (Signed)
CSW received consult for SNF placement. Talked with patient about ST rehab and patient agreeable (full assessment to follow) CSW will initiate facility search 1/7.  Keiden Deskin Givens, MSW, LCSW Licensed Clinical Social Worker [place] [phone]

## 2016-06-02 NOTE — Consult Note (Signed)
[facility] Nurse wound follow up Wound type: deep tissue pressure injury POA Measurement: 7cm x 5.5cm x 0cm  Wound bed: dark purple with superficial skin peeling  Drainage (amount, consistency, odor) none Periwound: intact  Dressing procedure/placement/frequency: continue soft silicone foam with close observation for evolution of wound be the bedside nursing staff.   Orthopedics are following the toe wounds now. Planning for debridement and amputations later this week if medically stable.  [facility] Nurse team will follow along with you for weekly wound assessments.  Please notify me of any acute changes in the wounds or any new areas of concerns [place] MSN, [place], CNS [phone]

## 2016-06-02 NOTE — Progress Notes (Signed)
[facility] for Infectious Disease   Reason for visit: Follow up on MSSA bacteremia   Interval History: repeat blood culture again positive; no complaints, feels about the same  Physical Exam: Constitutional:  Vitals:   06/02/16 0400 06/02/16 1005  BP: (!) 145/55 (!) 114/55  Pulse: 74 65  Resp: 16 14  Temp: 98.4 F (36.9 C) 97.4 F (36.3 C)   patient appears in NAD Respiratory: Normal respiratory effort; CTA B Cardiovascular: RRR  Review of Systems: Constitutional: negative for fevers and chills Respiratory: negative for cough Gastrointestinal: negative for diarrhea  Lab Results  Component Value Date   WBC 40.6 (H) 06/02/2016   HGB 8.0 (L) 06/02/2016   HCT 24.3 (L) 06/02/2016   MCV 83.2 06/02/2016   PLT 35 (L) 06/02/2016    Lab Results  Component Value Date   CREATININE 3.57 (H) 06/02/2016   BUN 86 (H) 06/02/2016   NA 134 (L) 06/02/2016   K 4.8 06/02/2016   CL 103 06/02/2016   CO2 20 (L) 06/02/2016    Lab Results  Component Value Date   ALT 39 05/27/2016   AST 57 (H) 05/27/2016   ALKPHOS 164 (H) 05/27/2016     Microbiology: Recent Results (from the past 240 hour(s))  MRSA PCR Screening     Status: None   Collection Time: 05/27/16  5:27 AM  Result Value Ref Range Status   MRSA by PCR NEGATIVE NEGATIVE Final    Comment:        The GeneXpert MRSA Assay (FDA approved for NASAL specimens only), is one component of a comprehensive MRSA colonization surveillance program. It is not intended to diagnose MRSA infection nor to guide or monitor treatment for MRSA infections.   Culture, Urine     Status: Abnormal   Collection Time: 05/27/16  8:04 AM  Result Value Ref Range Status   Specimen Description URINE, CATHETERIZED  Final   Special Requests NONE  Final   Culture 40,000 COLONIES/mL YEAST (A)  Final   Report Status 05/28/2016 FINAL  Final  Respiratory Panel by PCR     Status: Abnormal   Collection Time: 05/27/16  8:04 AM  Result Value Ref  Range Status   Adenovirus NOT DETECTED NOT DETECTED Final   Coronavirus 229E NOT DETECTED NOT DETECTED Final   Coronavirus HKU1 NOT DETECTED NOT DETECTED Final   Coronavirus NL63 NOT DETECTED NOT DETECTED Final   Coronavirus OC43 NOT DETECTED NOT DETECTED Final   Metapneumovirus NOT DETECTED NOT DETECTED Final   Rhinovirus / Enterovirus DETECTED (A) NOT DETECTED Final   Influenza A NOT DETECTED NOT DETECTED Final   Influenza B NOT DETECTED NOT DETECTED Final   Parainfluenza Virus 1 NOT DETECTED NOT DETECTED Final   Parainfluenza Virus 2 NOT DETECTED NOT DETECTED Final   Parainfluenza Virus 3 NOT DETECTED NOT DETECTED Final   Parainfluenza Virus 4 NOT DETECTED NOT DETECTED Final   Respiratory Syncytial Virus NOT DETECTED NOT DETECTED Final   Bordetella pertussis NOT DETECTED NOT DETECTED Final   Chlamydophila pneumoniae NOT DETECTED NOT DETECTED Final   Mycoplasma pneumoniae NOT DETECTED NOT DETECTED Final  Culture, blood (Routine X 2) w Reflex to ID Panel     Status: Abnormal   Collection Time: 05/27/16  9:05 AM  Result Value Ref Range Status   Specimen Description BLOOD RIGHT HAND  Final   Special Requests IN PEDIATRIC BOTTLE 1CC  Final   Culture  Setup Time   Final    GRAM POSITIVE  COCCI IN CLUSTERS IN PEDIATRIC BOTTLE CRITICAL RESULT CALLED TO, READ BACK BY AND VERIFIED WITH: CARON AMEND,PHARMD @0252  05/28/16 MKELLY,MLT    Culture STAPHYLOCOCCUS AUREUS (A)  Final   Report Status 05/30/2016 FINAL  Final   Organism ID, Bacteria STAPHYLOCOCCUS AUREUS  Final      Susceptibility   Staphylococcus aureus - MIC*    CIPROFLOXACIN <=0.5 SENSITIVE Sensitive     ERYTHROMYCIN >=8 RESISTANT Resistant     GENTAMICIN <=0.5 SENSITIVE Sensitive     OXACILLIN 0.5 SENSITIVE Sensitive     TETRACYCLINE <=1 SENSITIVE Sensitive     VANCOMYCIN <=0.5 SENSITIVE Sensitive     TRIMETH/SULFA <=10 SENSITIVE Sensitive     CLINDAMYCIN <=0.25 SENSITIVE Sensitive     RIFAMPIN <=0.5 SENSITIVE Sensitive      Inducible Clindamycin NEGATIVE Sensitive     * STAPHYLOCOCCUS AUREUS  Blood Culture ID Panel (Reflexed)     Status: Abnormal   Collection Time: 05/27/16  9:05 AM  Result Value Ref Range Status   Enterococcus species NOT DETECTED NOT DETECTED Final   Vancomycin resistance NOT DETECTED NOT DETECTED Final   Listeria monocytogenes NOT DETECTED NOT DETECTED Final   Staphylococcus species DETECTED (A) NOT DETECTED Final    Comment: CRITICAL RESULT CALLED TO, READ BACK BY AND VERIFIED WITH: CARON AMEND, PHARMD @0252  05/28/16 MKELLY,MLT    Staphylococcus aureus DETECTED (A) NOT DETECTED Final    Comment: CRITICAL RESULT CALLED TO, READ BACK BY AND VERIFIED WITH: CARON AMEND,PHARMD @0252  05/28/16 MKELLY,MLT    Methicillin resistance NOT DETECTED NOT DETECTED Final   Streptococcus species NOT DETECTED NOT DETECTED Final   Streptococcus agalactiae NOT DETECTED NOT DETECTED Final   Streptococcus pneumoniae NOT DETECTED NOT DETECTED Final   Streptococcus pyogenes NOT DETECTED NOT DETECTED Final   Acinetobacter baumannii NOT DETECTED NOT DETECTED Final   Enterobacteriaceae species NOT DETECTED NOT DETECTED Final   Enterobacter cloacae complex NOT DETECTED NOT DETECTED Final   Escherichia coli NOT DETECTED NOT DETECTED Final   Klebsiella oxytoca NOT DETECTED NOT DETECTED Final   Klebsiella pneumoniae NOT DETECTED NOT DETECTED Final   Proteus species NOT DETECTED NOT DETECTED Final   Serratia marcescens NOT DETECTED NOT DETECTED Final   Carbapenem resistance NOT DETECTED NOT DETECTED Final   Haemophilus influenzae NOT DETECTED NOT DETECTED Final   Neisseria meningitidis NOT DETECTED NOT DETECTED Final   Pseudomonas aeruginosa NOT DETECTED NOT DETECTED Final   Candida albicans NOT DETECTED NOT DETECTED Final   Candida glabrata NOT DETECTED NOT DETECTED Final   Candida krusei NOT DETECTED NOT DETECTED Final   Candida parapsilosis NOT DETECTED NOT DETECTED Final   Candida tropicalis NOT DETECTED  NOT DETECTED Final  Culture, blood (Routine X 2) w Reflex to ID Panel     Status: Abnormal   Collection Time: 05/27/16  9:12 AM  Result Value Ref Range Status   Specimen Description BLOOD CENTRAL LINE  Final   Special Requests IN PEDIATRIC BOTTLE 1CC  Final   Culture  Setup Time   Final    GRAM POSITIVE COCCI IN CLUSTERS IN PEDIATRIC BOTTLE CRITICAL VALUE NOTED.  VALUE IS CONSISTENT WITH PREVIOUSLY REPORTED AND CALLED VALUE.    Culture (A)  Final    STAPHYLOCOCCUS AUREUS SUSCEPTIBILITIES PERFORMED ON PREVIOUS CULTURE WITHIN THE LAST 5 DAYS.    Report Status 05/30/2016 FINAL  Final  Culture, blood (routine x 2)     Status: Abnormal   Collection Time: 05/28/16 12:00 PM  Result Value Ref Range Status   Specimen  Description BLOOD RIGHT HAND  Final   Special Requests IN PEDIATRIC BOTTLE  .[place]  Final   Culture  Setup Time   Final    GRAM POSITIVE COCCI IN CLUSTERS IN PEDIATRIC BOTTLE CRITICAL VALUE NOTED.  VALUE IS CONSISTENT WITH PREVIOUSLY REPORTED AND CALLED VALUE. CRITICAL RESULT CALLED TO, READ BACK BY AND VERIFIED WITH: CARON AMEND,PHARMD @0710  05/29/16 MKELLY,MLT    Culture (A)  Final    STAPHYLOCOCCUS AUREUS SUSCEPTIBILITIES PERFORMED ON PREVIOUS CULTURE WITHIN THE LAST 5 DAYS.    Report Status 05/30/2016 FINAL  Final  Culture, respiratory (NON-Expectorated)     Status: None   Collection Time: 05/28/16 12:58 PM  Result Value Ref Range Status   Specimen Description TRACHEAL ASPIRATE  Final   Special Requests Normal  Final   Gram Stain   Final    ABUNDANT WBC PRESENT,BOTH PMN AND MONONUCLEAR FEW GRAM POSITIVE COCCI IN PAIRS FEW YEAST    Culture   Final    MODERATE STAPHYLOCOCCUS AUREUS MODERATE CANDIDA ALBICANS    Report Status 06/01/2016 FINAL  Final   Organism ID, Bacteria STAPHYLOCOCCUS AUREUS  Final      Susceptibility   Staphylococcus aureus - MIC*    CIPROFLOXACIN <=0.5 SENSITIVE Sensitive     ERYTHROMYCIN >=8 RESISTANT Resistant     GENTAMICIN <=0.5  SENSITIVE Sensitive     OXACILLIN 0.5 SENSITIVE Sensitive     TETRACYCLINE <=1 SENSITIVE Sensitive     VANCOMYCIN <=0.5 SENSITIVE Sensitive     TRIMETH/SULFA <=10 SENSITIVE Sensitive     CLINDAMYCIN <=0.25 SENSITIVE Sensitive     RIFAMPIN <=0.5 SENSITIVE Sensitive     Inducible Clindamycin NEGATIVE Sensitive     * MODERATE STAPHYLOCOCCUS AUREUS  Culture, blood (Routine X 2) w Reflex to ID Panel     Status: None (Preliminary result)   Collection Time: 05/30/16  9:33 AM  Result Value Ref Range Status   Specimen Description BLOOD RIGHT ANTECUBITAL  Final   Special Requests BOTTLES DRAWN AEROBIC AND ANAEROBIC 5CC EA  Final   Culture  Setup Time   Final    ANAEROBIC BOTTLE ONLY GRAM POSITIVE COCCI IN CLUSTERS Organism ID to follow CRITICAL RESULT CALLED TO, READ BACK BY AND VERIFIED WITH: TO KHAMMONS(PHRMD)BY TCLEVELAND 06/02/2016 AT 5:09AM    Culture NO GROWTH 2 DAYS  Final   Report Status PENDING  Incomplete  Blood Culture ID Panel (Reflexed)     Status: Abnormal   Collection Time: 05/30/16  9:33 AM  Result Value Ref Range Status   Enterococcus species NOT DETECTED NOT DETECTED Final   Listeria monocytogenes NOT DETECTED NOT DETECTED Final   Staphylococcus species DETECTED (A) NOT DETECTED Final    Comment: CRITICAL RESULT CALLED TO, READ BACK BY AND VERIFIED WITH: TO KHAMMONS(PHARMD) BY TCLEVELAND 06/04/2016 AT 5:04AM    Staphylococcus aureus DETECTED (A) NOT DETECTED Final    Comment: CRITICAL RESULT CALLED TO, READ BACK BY AND VERIFIED WITH: TO KHAMMONS(PHARMD) BY TCLEVELAND 06/04/2016 AT 5:04AM    Methicillin resistance NOT DETECTED NOT DETECTED Final   Streptococcus species NOT DETECTED NOT DETECTED Final   Streptococcus agalactiae NOT DETECTED NOT DETECTED Final   Streptococcus pneumoniae NOT DETECTED NOT DETECTED Final   Streptococcus pyogenes NOT DETECTED NOT DETECTED Final   Acinetobacter baumannii NOT DETECTED NOT DETECTED Final   Enterobacteriaceae species NOT DETECTED  NOT DETECTED Final   Enterobacter cloacae complex NOT DETECTED NOT DETECTED Final   Escherichia coli NOT DETECTED NOT DETECTED Final   Klebsiella oxytoca NOT DETECTED NOT  DETECTED Final   Klebsiella pneumoniae NOT DETECTED NOT DETECTED Final   Proteus species NOT DETECTED NOT DETECTED Final   Serratia marcescens NOT DETECTED NOT DETECTED Final   Haemophilus influenzae NOT DETECTED NOT DETECTED Final   Neisseria meningitidis NOT DETECTED NOT DETECTED Final   Pseudomonas aeruginosa NOT DETECTED NOT DETECTED Final   Candida albicans NOT DETECTED NOT DETECTED Final   Candida glabrata NOT DETECTED NOT DETECTED Final   Candida krusei NOT DETECTED NOT DETECTED Final   Candida parapsilosis NOT DETECTED NOT DETECTED Final   Candida tropicalis NOT DETECTED NOT DETECTED Final  Culture, blood (Routine X 2) w Reflex to ID Panel     Status: None (Preliminary result)   Collection Time: 05/30/16  9:41 AM  Result Value Ref Range Status   Specimen Description BLOOD LEFT ANTECUBITAL  Final   Special Requests BOTTLES DRAWN AEROBIC AND ANAEROBIC 8CC EA  Final   Culture NO GROWTH 2 DAYS  Final   Report Status PENDING  Incomplete    Impression/Plan:  1. MSSA bacteremia - TEE negative for vegetation but repeat positive again. Will continue cefazolin 2. Osteomyelitis - ABI with normal blood flow.  Will need prolonged cefazolin of 6 weeks. Ortho following.

## 2016-06-02 NOTE — Progress Notes (Signed)
VASCULAR LAB PRELIMINARY  ARTERIAL  ABI completed: Right ABI of 1.21 and left ABI of 1.15 are suggestive of arterial flow within normal limits at rest.   RIGHT    LEFT    PRESSURE WAVEFORM  PRESSURE WAVEFORM  BRACHIAL 107 Triphasic BRACHIAL 121 Triphasic  DP 132 Biphasic DP 129 Biphasic  PT 146 Triphasic PT 139 Triphasic    RIGHT LEFT  ABI 1.21 1.15     Legrand Como, RVT 06/02/2016, 9:52 AM

## 2016-06-02 NOTE — Progress Notes (Signed)
PROGRESS NOTE  Tracy Barker SJG:[id number] DOB: August 02, 1945 DOA: 05/27/2016 PCP: No primary care provider on file.  Brief Summary:  CHIEF COMPLAINT:  Weakness  HISTORY OF PRESENT ILLNESS:   71 year old female with PMH significant for DM, HTN, and CLL. Reportedly she is a "frequent flyer" to the ED from [place], which she was sent, typically for falls. She has recently been seen there for cough x 1 month treated with Zpak, and fall with no apparent injury. 1/9 she presented for weakness x about 24 hours. Also had several episodes of vomiting. EMS was called and upon their arrival she was alert and oriented. In ED she was noted to additionally have some L sided weakness.  Laboratory evaluation significant for hyperkalemia, renal failure, and leukocytosis. She was given broad spectrum antibiotics (mero and linezolid) and temporizing therapies for hyperkalemia were used. Despite these therapies she became progressively more lethargic and developed shock. She was started on levophed with escalating doses, and eventually was started on epinephrine as well. No clear etiology determined, however, septic shock was considered most likely. She was transferred to [facility] for ICU admission. She is intubated on arrival to [facility] ICU   HPI/Recap of past 24 hours:  She remain very weak, repeat blood culture again positive  Assessment/Plan: Principal Problem:   Staphylococcus aureus bacteremia with sepsis ([place]) Active Problems:   Shock ([place])   Abdominal pain   Acute renal failure (ARF) (HCC)   Increased anion gap metabolic acidosis   Cavitary pneumonia   Skin ulcer of toe of left foot, limited to breakdown of skin (HCC)   Acute respiratory failure with hypoxia (HCC)   Acute osteomyelitis of left ankle or foot (HCC)  MSSA bacteremia/sepsit shock/acute respiratory failure with hypoxia: extubated, off pressor Negative TEE, Repeat blood culture remain positive, ? Source from foot? Left  foot x ray with oeteomyelitis  continue abx, taper stress dose steroids. ID following.  Cavitary lung masses: Likely septic emboli due to bacteremia per pccm  Leukocytosis, thrombocytopenia: from infection, drug , HIT panel in process, with h/o CLL, ct ab on presentation did not reveal liver or spleen abnormalities, patient does has h/o recurrent infection, consider hematology consult in am  Left foot /toe wound, ortho plan for "amputation of her 2nd toe with debridement of her other toe wounds vs amputation of the other toes " by the end of this week   AKI/ CKD III She required CRRT while in ICU Nephrology following  PAF, she required amiodarone drip in ICU, currently NSR, resume home meds ccb if bp continue to be stable. She has significant thrombocytopenia, now a candidate for anticoagulation.   Diabetes, poorly controlled, previously noninsulin dependent a1c 10.3, on insulin here Will need to discharge on insulin  Body mass index is 33.27 kg/m.   Code Status: full  Family Communication: patient   Disposition Plan: remain in the hospital , not ready for discharge, eventually will need snf   Consultants:  She is admitted to critical care on 1/10, transferred to hospitalist service on 1/16  Infectious disease  nephrology  Orthopedics/wound care  Procedures:  Intubation on 1/10>>1/12 R fem CVL 1/10 >>> Aline placement and removal TEE on 1/12 CVVHD  STUDIES:  CT head 1/9> non-acute CT abdomen pelvis 1/9 > non-acute, gallbladder distention, colonic wall thickening CT chest 1/9 > bilateral opacities and cavitary lung masses  US Renal 1/11 > no evidence of hydronephrosis; 3 cm left adrenal mass  DG Left Foot >>  osteomyelitis and septic arthritis   Antibiotics: Meropenem 1/9 > 1/11 Linezolid 1/9 > 1/14 Ancef 1/14 >>   CULTURES: Blood 1/10 > MSSA  Urine 1/10 > 40,000 colonies of yeast (likely insignificant)  Flu 1/10 > negative  RVP 1/10 > +Rhinovirus    Blood 1/11 > MSSA BAL cultures 1/11 >> Moderate staph aureus, moderate candida  Blood 1/13 >> remain positive for MSSA     Objective: BP (!) 119/49 (BP Location: Right Arm)   Pulse 81   Temp 97.4 F (36.3 C) (Oral)   Resp 16   Ht _0  (1.575 m) Comment: estimated  Wt 82.5 kg (181 lb 14.1 oz)   SpO2 100%   BMI 33.27 kg/m   Intake/Output Summary (Last 24 hours) at 06/02/16 1830 Last data filed at 06/02/16 1739  Gross per 24 hour  Intake              630 ml  Output             1200 ml  Net             -570 ml   Filed Weights   06/01/16 0030 06/01/16 0100 06/01/16 2257  Weight: 83.2 kg (183 lb 6.8 oz) 83.2 kg (183 lb 6.8 oz) 82.5 kg (181 lb 14.1 oz)    Exam:   General:  Frail, NAD  Cardiovascular: RRR  Respiratory: diminished at basis, no wheezing, no rhonchi, no rales  Abdomen: Soft/ND/NT, positive BS  Musculoskeletal: + Edema  Neuro: oriented   Data Reviewed: Basic Metabolic Panel:  Recent Labs Lab 05/27/16 0520  05/28/16 0303  05/29/16 0320 05/29/16 1600 05/30/16 0500 05/30/16 1741 05/31/16 0315 06/01/16 0300 06/02/16 0410  NA 137  < > 138  < > 139 137 140 133* 134* 135 134*  K 5.3*  < > 4.4  < > 4.6 5.0 4.9 5.4* 4.9 4.7 4.8  CL 100*  < > 93*  < > 100* 101 104 100* 102 102 103  CO2 10*  < > 23  < > _1 20*  GLUCOSE 283*  < > 206*  < > 210* 220* 181* 458* 168* 140* 108*  BUN 109*  < > 78*  < > 36* 30* 27* 38* 47* 69* 86*  CREATININE 6.29*  < > 3.58*  < > 1.94* 1.64* 1.45* 2.07* 2.44* 3.44* 3.57*  CALCIUM 6.6*  < > 6.0*  < > 7.2* 7.4* 7.5* 7.3* 7.8* 8.2* 8.1*  MG 1.2*  --  1.5*  --  2.4  --  2.5*  --  2.4  --   --   PHOS 9.2*  --  4.5  < > 4.2 4.3 4.1 5.4* 4.8*  --   --   < > = values in this interval not displayed. Liver Function Tests:  Recent Labs Lab 05/27/16 0520  05/29/16 0320 05/29/16 1600 05/30/16 0500 05/30/16 1741 05/31/16 0315  AST 57*  --   --   --   --   --   --   ALT 39  --   --   --   --   --   --    ALKPHOS 164*  --   --   --   --   --   --   BILITOT 0.9  --   --   --   --   --   --   PROT 4.3*  --   --   --   --   --   --  ALBUMIN 1.8*  < > 1.7* 1.7* 1.8* 1.6* 1.8*  < > = values in this interval not displayed. No results for input(s): LIPASE, AMYLASE in the last 168 hours. No results for input(s): AMMONIA in the last 168 hours. CBC:  Recent Labs Lab 05/27/16 0520  05/30/16 0500 05/30/16 0934 05/31/16 0315 06/01/16 0300 06/02/16 0410  WBC 33.7*  < > 51.7* 47.5* 48.8* 28.6* 40.6*  NEUTROABS 15.2*  --   --  30.4* 19.5* 9.2*  --   HGB 11.2*  < > 10.7* 11.2* 9.0* 7.6* 8.0*  HCT 35.3*  < > 32.6* 33.8* 27.1* 23.0* 24.3*  MCV 84.9  < > 82.5 82.8 81.9 82.4 83.2  PLT 212  < > 83* 94* 72* 40* 35*  < > = values in this interval not displayed. Cardiac Enzymes:    Recent Labs Lab 05/27/16 0647 05/27/16 1739 05/28/16 0303 05/28/16 0826 05/28/16 1211 05/28/16 1415 05/28/16 2049 05/29/16 0320  CKTOTAL  --  1,191* 1,239*  --  1,090*  --   --  412*  TROPONINI 0.34* 0.44*  --  0.35*  --  0.32* 0.22*  --    BNP (last 3 results) No results for input(s): BNP in the last 8760 hours.  ProBNP (last 3 results) No results for input(s): PROBNP in the last 8760 hours.  CBG:  Recent Labs Lab 06/02/16 0045 06/02/16 0407 06/02/16 0813 06/02/16 1138 06/02/16 1743  GLUCAP 126* 115* 79 107* 164*    Recent Results (from the past 240 hour(s))  MRSA PCR Screening     Status: None   Collection Time: 05/27/16  5:27 AM  Result Value Ref Range Status   MRSA by PCR NEGATIVE NEGATIVE Final    Comment:        The GeneXpert MRSA Assay (FDA approved for NASAL specimens only), is one component of a comprehensive MRSA colonization surveillance program. It is not intended to diagnose MRSA infection nor to guide or monitor treatment for MRSA infections.   Culture, Urine     Status: Abnormal   Collection Time: 05/27/16  8:04 AM  Result Value Ref Range Status   Specimen Description  URINE, CATHETERIZED  Final   Special Requests NONE  Final   Culture 40,000 COLONIES/mL YEAST (A)  Final   Report Status 05/28/2016 FINAL  Final  Respiratory Panel by PCR     Status: Abnormal   Collection Time: 05/27/16  8:04 AM  Result Value Ref Range Status   Adenovirus NOT DETECTED NOT DETECTED Final   Coronavirus 229E NOT DETECTED NOT DETECTED Final   Coronavirus HKU1 NOT DETECTED NOT DETECTED Final   Coronavirus NL63 NOT DETECTED NOT DETECTED Final   Coronavirus OC43 NOT DETECTED NOT DETECTED Final   Metapneumovirus NOT DETECTED NOT DETECTED Final   Rhinovirus / Enterovirus DETECTED (A) NOT DETECTED Final   Influenza A NOT DETECTED NOT DETECTED Final   Influenza B NOT DETECTED NOT DETECTED Final   Parainfluenza Virus 1 NOT DETECTED NOT DETECTED Final   Parainfluenza Virus 2 NOT DETECTED NOT DETECTED Final   Parainfluenza Virus 3 NOT DETECTED NOT DETECTED Final   Parainfluenza Virus 4 NOT DETECTED NOT DETECTED Final   Respiratory Syncytial Virus NOT DETECTED NOT DETECTED Final   Bordetella pertussis NOT DETECTED NOT DETECTED Final   Chlamydophila pneumoniae NOT DETECTED NOT DETECTED Final   Mycoplasma pneumoniae NOT DETECTED NOT DETECTED Final  Culture, blood (Routine X 2) w Reflex to ID Panel     Status: Abnormal   Collection  Time: 05/27/16  9:05 AM  Result Value Ref Range Status   Specimen Description BLOOD RIGHT HAND  Final   Special Requests IN PEDIATRIC BOTTLE 1CC  Final   Culture  Setup Time   Final    GRAM POSITIVE COCCI IN CLUSTERS IN PEDIATRIC BOTTLE CRITICAL RESULT CALLED TO, READ BACK BY AND VERIFIED WITH: CARON AMEND,PHARMD _0  05/28/16 MKELLY,MLT    Culture STAPHYLOCOCCUS AUREUS (A)  Final   Report Status 05/30/2016 FINAL  Final   Organism ID, Bacteria STAPHYLOCOCCUS AUREUS  Final      Susceptibility   Staphylococcus aureus - MIC*    CIPROFLOXACIN <=0.5 SENSITIVE Sensitive     ERYTHROMYCIN >=8 RESISTANT Resistant     GENTAMICIN <=0.5 SENSITIVE Sensitive      OXACILLIN 0.5 SENSITIVE Sensitive     TETRACYCLINE <=1 SENSITIVE Sensitive     VANCOMYCIN <=0.5 SENSITIVE Sensitive     TRIMETH/SULFA <=10 SENSITIVE Sensitive     CLINDAMYCIN <=0.25 SENSITIVE Sensitive     RIFAMPIN <=0.5 SENSITIVE Sensitive     Inducible Clindamycin NEGATIVE Sensitive     * STAPHYLOCOCCUS AUREUS  Blood Culture ID Panel (Reflexed)     Status: Abnormal   Collection Time: 05/27/16  9:05 AM  Result Value Ref Range Status   Enterococcus species NOT DETECTED NOT DETECTED Final   Vancomycin resistance NOT DETECTED NOT DETECTED Final   Listeria monocytogenes NOT DETECTED NOT DETECTED Final   Staphylococcus species DETECTED (A) NOT DETECTED Final    Comment: CRITICAL RESULT CALLED TO, READ BACK BY AND VERIFIED WITH: CARON AMEND, PHARMD _1  05/28/16 MKELLY,MLT    Staphylococcus aureus DETECTED (A) NOT DETECTED Final    Comment: CRITICAL RESULT CALLED TO, READ BACK BY AND VERIFIED WITH: CARON AMEND,PHARMD _2  05/28/16 MKELLY,MLT    Methicillin resistance NOT DETECTED NOT DETECTED Final   Streptococcus species NOT DETECTED NOT DETECTED Final   Streptococcus agalactiae NOT DETECTED NOT DETECTED Final   Streptococcus pneumoniae NOT DETECTED NOT DETECTED Final   Streptococcus pyogenes NOT DETECTED NOT DETECTED Final   Acinetobacter baumannii NOT DETECTED NOT DETECTED Final   Enterobacteriaceae species NOT DETECTED NOT DETECTED Final   Enterobacter cloacae complex NOT DETECTED NOT DETECTED Final   Escherichia coli NOT DETECTED NOT DETECTED Final   Klebsiella oxytoca NOT DETECTED NOT DETECTED Final   Klebsiella pneumoniae NOT DETECTED NOT DETECTED Final   Proteus species NOT DETECTED NOT DETECTED Final   Serratia marcescens NOT DETECTED NOT DETECTED Final   Carbapenem resistance NOT DETECTED NOT DETECTED Final   Haemophilus influenzae NOT DETECTED NOT DETECTED Final   Neisseria meningitidis NOT DETECTED NOT DETECTED Final   Pseudomonas aeruginosa NOT DETECTED NOT DETECTED  Final   Candida albicans NOT DETECTED NOT DETECTED Final   Candida glabrata NOT DETECTED NOT DETECTED Final   Candida krusei NOT DETECTED NOT DETECTED Final   Candida parapsilosis NOT DETECTED NOT DETECTED Final   Candida tropicalis NOT DETECTED NOT DETECTED Final  Culture, blood (Routine X 2) w Reflex to ID Panel     Status: Abnormal   Collection Time: 05/27/16  9:12 AM  Result Value Ref Range Status   Specimen Description BLOOD CENTRAL LINE  Final   Special Requests IN PEDIATRIC BOTTLE 1CC  Final   Culture  Setup Time   Final    GRAM POSITIVE COCCI IN CLUSTERS IN PEDIATRIC BOTTLE CRITICAL VALUE NOTED.  VALUE IS CONSISTENT WITH PREVIOUSLY REPORTED AND CALLED VALUE.    Culture (A)  Final    STAPHYLOCOCCUS AUREUS SUSCEPTIBILITIES PERFORMED ON  PREVIOUS CULTURE WITHIN THE LAST 5 DAYS.    Report Status 05/30/2016 FINAL  Final  Culture, blood (routine x 2)     Status: Abnormal   Collection Time: 05/28/16 12:00 PM  Result Value Ref Range Status   Specimen Description BLOOD RIGHT HAND  Final   Special Requests IN PEDIATRIC BOTTLE  .Vina  Final   Culture  Setup Time   Final    GRAM POSITIVE COCCI IN CLUSTERS IN PEDIATRIC BOTTLE CRITICAL VALUE NOTED.  VALUE IS CONSISTENT WITH PREVIOUSLY REPORTED AND CALLED VALUE. CRITICAL RESULT CALLED TO, READ BACK BY AND VERIFIED WITH: CARON AMEND,PHARMD _0  05/29/16 MKELLY,MLT    Culture (A)  Final    STAPHYLOCOCCUS AUREUS SUSCEPTIBILITIES PERFORMED ON PREVIOUS CULTURE WITHIN THE LAST 5 DAYS.    Report Status 05/30/2016 FINAL  Final  Culture, respiratory (NON-Expectorated)     Status: None   Collection Time: 05/28/16 12:58 PM  Result Value Ref Range Status   Specimen Description TRACHEAL ASPIRATE  Final   Special Requests Normal  Final   Gram Stain   Final    ABUNDANT WBC PRESENT,BOTH PMN AND MONONUCLEAR FEW GRAM POSITIVE COCCI IN PAIRS FEW YEAST    Culture   Final    MODERATE STAPHYLOCOCCUS AUREUS MODERATE CANDIDA ALBICANS    Report  Status 06/01/2016 FINAL  Final   Organism ID, Bacteria STAPHYLOCOCCUS AUREUS  Final      Susceptibility   Staphylococcus aureus - MIC*    CIPROFLOXACIN <=0.5 SENSITIVE Sensitive     ERYTHROMYCIN >=8 RESISTANT Resistant     GENTAMICIN <=0.5 SENSITIVE Sensitive     OXACILLIN 0.5 SENSITIVE Sensitive     TETRACYCLINE <=1 SENSITIVE Sensitive     VANCOMYCIN <=0.5 SENSITIVE Sensitive     TRIMETH/SULFA <=10 SENSITIVE Sensitive     CLINDAMYCIN <=0.25 SENSITIVE Sensitive     RIFAMPIN <=0.5 SENSITIVE Sensitive     Inducible Clindamycin NEGATIVE Sensitive     * MODERATE STAPHYLOCOCCUS AUREUS  Culture, blood (Routine X 2) w Reflex to ID Panel     Status: None (Preliminary result)   Collection Time: 05/30/16  9:33 AM  Result Value Ref Range Status   Specimen Description BLOOD RIGHT ANTECUBITAL  Final   Special Requests BOTTLES DRAWN AEROBIC AND ANAEROBIC 5CC EA  Final   Culture  Setup Time   Final    ANAEROBIC BOTTLE ONLY GRAM POSITIVE COCCI IN CLUSTERS Organism ID to follow CRITICAL RESULT CALLED TO, READ BACK BY AND VERIFIED WITH: TO KHAMMONS(PHRMD)BY TCLEVELAND 06/02/2016 AT 5:09AM    Culture NO GROWTH 3 DAYS  Final   Report Status PENDING  Incomplete  Blood Culture ID Panel (Reflexed)     Status: Abnormal   Collection Time: 05/30/16  9:33 AM  Result Value Ref Range Status   Enterococcus species NOT DETECTED NOT DETECTED Final   Listeria monocytogenes NOT DETECTED NOT DETECTED Final   Staphylococcus species DETECTED (A) NOT DETECTED Final    Comment: CRITICAL RESULT CALLED TO, READ BACK BY AND VERIFIED WITH: TO KHAMMONS(PHARMD) BY TCLEVELAND 06/04/2016 AT 5:04AM    Staphylococcus aureus DETECTED (A) NOT DETECTED Final    Comment: CRITICAL RESULT CALLED TO, READ BACK BY AND VERIFIED WITH: TO KHAMMONS(PHARMD) BY TCLEVELAND 06/04/2016 AT 5:04AM    Methicillin resistance NOT DETECTED NOT DETECTED Final   Streptococcus species NOT DETECTED NOT DETECTED Final   Streptococcus agalactiae NOT  DETECTED NOT DETECTED Final   Streptococcus pneumoniae NOT DETECTED NOT DETECTED Final   Streptococcus pyogenes NOT DETECTED NOT DETECTED Final  Acinetobacter baumannii NOT DETECTED NOT DETECTED Final   Enterobacteriaceae species NOT DETECTED NOT DETECTED Final   Enterobacter cloacae complex NOT DETECTED NOT DETECTED Final   Escherichia coli NOT DETECTED NOT DETECTED Final   Klebsiella oxytoca NOT DETECTED NOT DETECTED Final   Klebsiella pneumoniae NOT DETECTED NOT DETECTED Final   Proteus species NOT DETECTED NOT DETECTED Final   Serratia marcescens NOT DETECTED NOT DETECTED Final   Haemophilus influenzae NOT DETECTED NOT DETECTED Final   Neisseria meningitidis NOT DETECTED NOT DETECTED Final   Pseudomonas aeruginosa NOT DETECTED NOT DETECTED Final   Candida albicans NOT DETECTED NOT DETECTED Final   Candida glabrata NOT DETECTED NOT DETECTED Final   Candida krusei NOT DETECTED NOT DETECTED Final   Candida parapsilosis NOT DETECTED NOT DETECTED Final   Candida tropicalis NOT DETECTED NOT DETECTED Final  Culture, blood (Routine X 2) w Reflex to ID Panel     Status: None (Preliminary result)   Collection Time: 05/30/16  9:41 AM  Result Value Ref Range Status   Specimen Description BLOOD LEFT ANTECUBITAL  Final   Special Requests BOTTLES DRAWN AEROBIC AND ANAEROBIC 8CC EA  Final   Culture NO GROWTH 3 DAYS  Final   Report Status PENDING  Incomplete     Studies: No results found.  Scheduled Meds: .  stroke: mapping our early stages of recovery book   Does not apply Once  .  ceFAZolin (ANCEF) IV  1 g Intravenous Q24H  . feeding supplement (NEPRO CARB STEADY)  237 mL Oral BID BM  . hydrocortisone sod succinate (SOLU-CORTEF) inj  50 mg Intravenous Q12H  . insulin aspart  0-20 Units Subcutaneous Q4H  . insulin glargine  30 Units Subcutaneous Q24H  . mouth rinse  15 mL Mouth Rinse BID    Continuous Infusions:   Time spent: 37mns  Wilmot Quevedo MD, PhD  [facility] Pager  [phone] If 7PM-7AM, please contact night-coverage at [email or web address], password [email or web address]1/16/2018, 6:30 PM  LOS: 6 days

## 2016-06-02 NOTE — NC FL2 (Signed)
[place] MEDICAID FL2 LEVEL OF CARE SCREENING TOOL     IDENTIFICATION  Patient Name: Tracy Barker Birthdate: Sep 18, 1945 Sex: female Admission Date (Current Location): 05/27/2016  [facility] and [place] Number:  Herbalist and Address:  [facility], [place] [address], [place], [place] [address]      Provider Number: [id number]  Attending Physician Name and Address:  Javier Glazier, MD  Relative Name and Phone Number:       Current Level of Care: Hospital Recommended Level of Care: [place] Prior Approval Number:    Date Approved/Denied:   PASRR Number: [id number]  Discharge Plan: SNF    Current Diagnoses: Patient Active Problem List   Diagnosis Date Noted  . Acute osteomyelitis of left ankle or foot ([place]) 05/29/2016  . Acute respiratory failure with hypoxia ([place])   . Staphylococcus aureus bacteremia with sepsis ([place]) 05/28/2016  . Cavitary pneumonia 05/28/2016  . Skin ulcer of toe of left foot, limited to breakdown of skin ([place]) 05/28/2016  . Shock ([place]) 05/27/2016  . Abdominal pain   . Acute renal failure (ARF) ([place])   . Increased anion gap metabolic acidosis     Orientation RESPIRATION BLADDER Height & Weight     Self, Time, Situation, Place  Normal Incontinent (Urethral catheter) Weight: 181 lb 14.1 oz (82.5 kg) Height:  5\' 2"  (157.5 cm) (estimated)  BEHAVIORAL SYMPTOMS/MOOD NEUROLOGICAL BOWEL NUTRITION STATUS      Incontinent (Rectal tube) Diet (Carb modified, thin liquids)  AMBULATORY STATUS COMMUNICATION OF NEEDS Skin   Supervision Verbally Other (Comment) (Stg 1 PU to bilateral posterior heels with foam dressing. Arterial ulcerations to left 2nd & 3rd toes. Deep tissue injury to upper gluteal cleft. )                       Personal Care Assistance Level of Assistance  Bathing, Feeding, Dressing Bathing Assistance: Maximum assistance Feeding assistance: Maximum assistance (Mod assist per OT) Dressing  Assistance: Maximum assistance     Functional Limitations Info  Sight, Hearing, Speech Sight Info: Adequate Hearing Info: Adequate Speech Info: Adequate    SPECIAL CARE FACTORS FREQUENCY  PT (By licensed PT), OT (By licensed OT), Speech therapy     PT Frequency: Eval 1/15 with a recommendation of a minimum of 3X per week therapy OT Frequency: Eval 1/13 with a recommendation of a minimum of 2X per week therapy     Speech Therapy Frequency: Evaluated 1/14      Contractures Contractures Info: Not present    Additional Factors Info  Code Status, Allergies, Insulin Sliding Scale Code Status Info: Full Allergies Info: Amlodipine, Clindamiycin, Zosyn   Insulin Sliding Scale Info: 0-20 Units daily every 4 hours       Current Medications (06/02/2016):  This is the current hospital active medication list Current Facility-Administered Medications  Medication Dose Route Frequency Provider Last Rate Last Dose  .  stroke: mapping our early stages of recovery book   Does not apply Once Carly J Rivet, MD      . 0.9 %  sodium chloride infusion  250 mL Intravenous PRN Corey Harold, NP 10 mL/hr at 06/01/16 0400 250 mL at 06/01/16 0400  . ceFAZolin (ANCEF) IVPB 1 g/50 mL premix  1 g Intravenous Q24H Assunta Found Stone, RPH   1 g at 06/01/16 1400  . furosemide (LASIX) injection 40 mg  40 mg Intravenous Once Madelon Lips, MD      .  hydrocortisone sodium succinate (SOLU-CORTEF) 100 MG injection 50 mg  50 mg Intravenous Q12H Velna Ochs, MD   50 mg at 06/02/16 0600  . insulin aspart (novoLOG) injection 0-20 Units  0-20 Units Subcutaneous Q4H Anders Simmonds, MD   3 Units at 06/02/16 0000  . insulin glargine (LANTUS) injection 30 Units  30 Units Subcutaneous Q24H Anders Simmonds, MD   30 Units at 06/02/16 0030  . MEDLINE mouth rinse  15 mL Mouth Rinse BID Anders Simmonds, MD   15 mL at 06/01/16 2200     Discharge Medications: Please see discharge summary for a list of discharge  medications.  Relevant Imaging Results:  Relevant Lab Results:   Additional Information ss#[id number].   Sable Feil, LCSW

## 2016-06-02 NOTE — Progress Notes (Signed)
Nutrition Follow-up  DOCUMENTATION CODES:   Obesity unspecified  INTERVENTION:  Provide Nepro Shake po BID, each supplement provides 425 kcal and 19 grams protein.  Encourage adequate PO intake.   NUTRITION DIAGNOSIS:   Inadequate oral intake related to inability to eat as evidenced by NPO status; diet advanced; po 50-100%; improved  GOAL:   Patient will meet greater than or equal to 90% of their needs; met  MONITOR:   PO intake, Supplement acceptance, Labs, Weight trends, Skin, I & O's  REASON FOR ASSESSMENT:   Rounds, Consult Enteral/tube feeding initiation and management  ASSESSMENT:   71 yo female with hx of CLL, poorly controlled DM, admitted w/ refractory shock/MODS as well as left sided weakness. Working dx is sepsis +/- new stroke. Pt on iHD.   Meal completion has been 50-100%. Pt reports having a good appetite currently and PTA with usual consumption of at least 3 meals a day. Usual body weight reported to be ~174 lbs. Pt is agreeable to nutritional supplements to aid in caloric and protein needs. RD to order.   Labs and medications reviewed.   Diet Order:  Diet Carb Modified Fluid consistency: Thin; Room service appropriate? Yes  Skin:  Wound (see comment) (Stg 1 bilat heel, unstg L diabetic ulcer toe, non pressure related wound to coccyx)  Last BM:  1/16  Height:   Ht Readings from Last 1 Encounters:  05/28/16 5' 2"  (1.575 m)    Weight:   Wt Readings from Last 1 Encounters:  06/01/16 181 lb 14.1 oz (82.5 kg)    Ideal Body Weight:  50 kg  BMI:  Body mass index is 33.27 kg/m.  Estimated Nutritional Needs:   Kcal:  1800-2000  Protein:  100-110 grams  Fluid:  Per MD  EDUCATION NEEDS:   No education needs identified at this time  Corrin Parker, MS, RD, LDN Pager # [phone] After hours/ weekend pager # [phone]

## 2016-06-02 NOTE — Progress Notes (Signed)
PHARMACY - PHYSICIAN COMMUNICATION CRITICAL VALUE ALERT - BLOOD CULTURE IDENTIFICATION (BCID)  Results for orders placed or performed during the hospital encounter of 05/27/16  Blood Culture ID Panel (Reflexed) (Collected: 05/30/2016  9:33 AM)  Result Value Ref Range   Enterococcus species NOT DETECTED NOT DETECTED   Listeria monocytogenes NOT DETECTED NOT DETECTED   Staphylococcus species DETECTED (A) NOT DETECTED   Staphylococcus aureus DETECTED (A) NOT DETECTED   Methicillin resistance NOT DETECTED NOT DETECTED   Streptococcus species NOT DETECTED NOT DETECTED   Streptococcus agalactiae NOT DETECTED NOT DETECTED   Streptococcus pneumoniae NOT DETECTED NOT DETECTED   Streptococcus pyogenes NOT DETECTED NOT DETECTED   Acinetobacter baumannii NOT DETECTED NOT DETECTED   Enterobacteriaceae species NOT DETECTED NOT DETECTED   Enterobacter cloacae complex NOT DETECTED NOT DETECTED   Escherichia coli NOT DETECTED NOT DETECTED   Klebsiella oxytoca NOT DETECTED NOT DETECTED   Klebsiella pneumoniae NOT DETECTED NOT DETECTED   Proteus species NOT DETECTED NOT DETECTED   Serratia marcescens NOT DETECTED NOT DETECTED   Haemophilus influenzae NOT DETECTED NOT DETECTED   Neisseria meningitidis NOT DETECTED NOT DETECTED   Pseudomonas aeruginosa NOT DETECTED NOT DETECTED   Candida albicans NOT DETECTED NOT DETECTED   Candida glabrata NOT DETECTED NOT DETECTED   Candida krusei NOT DETECTED NOT DETECTED   Candida parapsilosis NOT DETECTED NOT DETECTED   Candida tropicalis NOT DETECTED NOT DETECTED    Name of physician (or Provider) Contacted: N/A.  This is a repeat culture with no difference from previous cultures collected on 1/10 and 1/11.  Changes to prescribed antibiotics required: None. Continue Ancef.  Will need repeat cultures to document clearance of infection.  Tracy Barker 06/02/2016  5:37 AM

## 2016-06-02 NOTE — Care Management Important Message (Signed)
Important Message  Patient Details  Name: Tracy Barker MRN: [id number] Date of Birth: Sep 23, 1945   Medicare Important Message Given:  Yes    Cristina Mattern Montine Circle 06/02/2016, 12:28 PM

## 2016-06-02 NOTE — Progress Notes (Signed)
Patient ID: Tracy Barker, female   DOB: 1945/09/06, 71 y.o.   MRN: [id number] Clinically, she looks better overall.  The toes on her left foot ae stable.  The dorsal wounds are necrotic, but are dry and there is no purulence.  The swelling is minimal and there is no associated cellulitis.  The second toe does show changes consistent with osteo.  No urgent surgery is needed, but I would recommend an amputation of her 2nd toe with debridement of her other toe wounds vs amputation of the other toes as well.  This definitely was not the source of her sepsis.  I will continue to follow her and plan on surgery by the end of the week as she continues to improve medically.

## 2016-06-02 NOTE — Progress Notes (Signed)
Patient ID: Tracy Barker, female   DOB: June 24, 1945, 71 y.o.   MRN: [id number] [facility] Progress Note   Assessment/ Plan:   1. AKI on CKD stage 3: Clinically suspected ATN associated with MSSA bacteremia/sepsis. Serologies negative for ANCA vasculitis, antinuclear antibody negative and anti-GBM antibody negative. Off CRRT, pressures better. No indication for HD today, looks like her creatinine may be plateauing. Her nephrologist up in [place], [place] is [name]. D/c TDC since leukocytosis increasing today.  Will give 40 IV lasix. 2. Staphylococcus bacteremia with sepsis: MSSA bacteremia with cavitary pneumonia for which he is on coverage with linezolid-source suspected to be probably from her lower extremity ulcers/osteomyelitis and without any evidence of endocarditis on TEE. Evaluated yesterday by orthopedic surgery and their plans noted. D/c TDC as above.    3. Mixed anion-gap and non-anion gap acidosis: From acute renal failure/metformin and recent GI losses. Acidosis corrected  4. Hyperkalemia:  acute hyperkalemia corrected with CRRT, now resolved. 5. Abdominal pain:  no clear evidence of mesenteric ischemia, CT showed intra-abdominal adenopathy/dilated gallbladder.  Subjective:   Off pressors, WBC ct coming down, still remains oliguric with soft pressures   Objective:   BP (!) 114/55 (BP Location: Right Arm)   Pulse 65   Temp 97.4 F (36.3 C) (Oral)   Resp 14   Ht 5\' 2"  (1.575 m) Comment: estimated  Wt 82.5 kg (181 lb 14.1 oz)   SpO2 98%   BMI 33.27 kg/m   Intake/Output Summary (Last 24 hours) at 06/02/16 1231 Last data filed at 06/02/16 1224  Gross per 24 hour  Intake              320 ml  Output              900 ml  Net             -580 ml   Weight change: -0.7 kg (-1 lb 8.7 oz)  Physical Exam: [id number] resting in bed-awake and alert CVS: Pulse regular rhythm, normal rate, normal S1 and S2 Resp: Clear to auscultation, no rales or rhonchi Abd:  Soft, obese, mildly tender right upper quadrant Ext: No lower extremity edema, dusky appearing left toes with scab covered dorsal ulcers   Imaging: No results found.  Labs: BMET  Recent Labs Lab 05/28/16 0321  05/28/16 1531  05/29/16 0320 05/29/16 1600 05/30/16 0500 05/30/16 1741 05/31/16 0315 06/01/16 0300 06/02/16 0410  NA 138  < > 138  < > 139 137 140 133* 134* 135 134*  K 4.4  < > 4.6  < > 4.6 5.0 4.9 5.4* 4.9 4.7 4.8  CL 92*  < > 97*  < > 100* 101 104 100* 102 102 103  CO2 24  < > 24  < > 26 24 23 22 22 22  20*  GLUCOSE 207*  < > 239*  < > 210* 220* 181* 458* 168* 140* 108*  BUN 77*  < > 55*  < > 36* 30* 27* 38* 47* 69* 86*  CREATININE 3.60*  < > 2.60*  < > 1.94* 1.64* 1.45* 2.07* 2.44* 3.44* 3.57*  CALCIUM 6.1*  < > 6.9*  < > 7.2* 7.4* 7.5* 7.3* 7.8* 8.2* 8.1*  PHOS 4.5  --  5.6*  --  4.2 4.3 4.1 5.4* 4.8*  --   --   < > = values in this interval not displayed. CBC  Recent Labs Lab 05/27/16 0520  05/30/16 0934 05/31/16 0315 06/01/16 0300 06/02/16 0410  WBC 33.7*  < > 47.5* 48.8* 28.6* 40.6*  NEUTROABS 15.2*  --  30.4* 19.5* 9.2*  --   HGB 11.2*  < > 11.2* 9.0* 7.6* 8.0*  HCT 35.3*  < > 33.8* 27.1* 23.0* 24.3*  MCV 84.9  < > 82.8 81.9 82.4 83.2  PLT 212  < > 94* 72* 40* 35*  < > = values in this interval not displayed. Medications:    .  stroke: mapping our early stages of recovery book   Does not apply Once  .  ceFAZolin (ANCEF) IV  1 g Intravenous Q24H  . hydrocortisone sod succinate (SOLU-CORTEF) inj  50 mg Intravenous Q12H  . insulin aspart  0-20 Units Subcutaneous Q4H  . insulin glargine  30 Units Subcutaneous Q24H  . mouth rinse  15 mL Mouth Rinse BID   Madelon Lips, MD 06/02/2016, 12:31 PM

## 2016-06-03 DIAGNOSIS — B9561 Methicillin susceptible Staphylococcus aureus infection as the cause of diseases classified elsewhere: Secondary | ICD-10-CM

## 2016-06-03 LAB — BASIC METABOLIC PANEL
ANION GAP: 13 (ref 5–15)
BUN: 91 mg/dL — ABNORMAL HIGH (ref 6–20)
CO2: 21 mmol/L — AB (ref 22–32)
Calcium: 8.3 mg/dL — ABNORMAL LOW (ref 8.9–10.3)
Chloride: 104 mmol/L (ref 101–111)
Creatinine, Ser: 3.26 mg/dL — ABNORMAL HIGH (ref 0.44–1.00)
GFR, EST AFRICAN AMERICAN: 15 mL/min — AB (ref >60)
GFR, EST NON AFRICAN AMERICAN: 13 mL/min — AB (ref >60)
GLUCOSE: 115 mg/dL — AB (ref 65–99)
POTASSIUM: 3.6 mmol/L (ref 3.5–5.1)
Sodium: 138 mmol/L (ref 135–145)

## 2016-06-03 LAB — CBC WITH DIFFERENTIAL/PLATELET
Basophils Absolute: 0 10*3/uL (ref 0.0–0.1)
Basophils Relative: 0 %
EOS PCT: 0 %
Eosinophils Absolute: 0 10*3/uL (ref 0.0–0.7)
HCT: 25.1 % — ABNORMAL LOW (ref 36.0–46.0)
HEMOGLOBIN: 8.2 g/dL — AB (ref 12.0–15.0)
LYMPHS PCT: 74 %
Lymphs Abs: 36.1 10*3/uL — ABNORMAL HIGH (ref 0.7–4.0)
MCH: 27.2 pg (ref 26.0–34.0)
MCHC: 32.7 g/dL (ref 30.0–36.0)
MCV: 83.4 fL (ref 78.0–100.0)
MONOS PCT: 1 %
Monocytes Absolute: 0.5 10*3/uL (ref 0.1–1.0)
Neutro Abs: 12.2 10*3/uL — ABNORMAL HIGH (ref 1.7–7.7)
Neutrophils Relative %: 25 %
PLATELETS: 30 10*3/uL — AB (ref 150–400)
RBC: 3.01 MIL/uL — AB (ref 3.87–5.11)
RDW: 15.1 % (ref 11.5–15.5)
WBC: 48.8 10*3/uL — AB (ref 4.0–10.5)

## 2016-06-03 LAB — CBC
HEMATOCRIT: 26.9 % — AB (ref 36.0–46.0)
HEMOGLOBIN: 8.7 g/dL — AB (ref 12.0–15.0)
MCH: 26.9 pg (ref 26.0–34.0)
MCHC: 32.3 g/dL (ref 30.0–36.0)
MCV: 83.3 fL (ref 78.0–100.0)
Platelets: 29 10*3/uL — CL (ref 150–400)
RBC: 3.23 MIL/uL — AB (ref 3.87–5.11)
RDW: 15.1 % (ref 11.5–15.5)
WBC: 52.1 10*3/uL (ref 4.0–10.5)

## 2016-06-03 LAB — COMPREHENSIVE METABOLIC PANEL
ALBUMIN: 1.7 g/dL — AB (ref 3.5–5.0)
ALK PHOS: 315 U/L — AB (ref 38–126)
ALT: 18 U/L (ref 14–54)
AST: 27 U/L (ref 15–41)
Anion gap: 12 (ref 5–15)
BUN: 95 mg/dL — ABNORMAL HIGH (ref 6–20)
CALCIUM: 7.9 mg/dL — AB (ref 8.9–10.3)
CHLORIDE: 105 mmol/L (ref 101–111)
CO2: 20 mmol/L — AB (ref 22–32)
CREATININE: 3.37 mg/dL — AB (ref 0.44–1.00)
GFR calc non Af Amer: 13 mL/min — ABNORMAL LOW (ref >60)
GFR, EST AFRICAN AMERICAN: 15 mL/min — AB (ref >60)
GLUCOSE: 124 mg/dL — AB (ref 65–99)
Potassium: 4 mmol/L (ref 3.5–5.1)
SODIUM: 137 mmol/L (ref 135–145)
Total Bilirubin: 0.9 mg/dL (ref 0.3–1.2)
Total Protein: 3.6 g/dL — ABNORMAL LOW (ref 6.5–8.1)

## 2016-06-03 LAB — GLUCOSE, CAPILLARY
GLUCOSE-CAPILLARY: 127 mg/dL — AB (ref 65–99)
Glucose-Capillary: 106 mg/dL — ABNORMAL HIGH (ref 65–99)
Glucose-Capillary: 128 mg/dL — ABNORMAL HIGH (ref 65–99)
Glucose-Capillary: 129 mg/dL — ABNORMAL HIGH (ref 65–99)
Glucose-Capillary: 135 mg/dL — ABNORMAL HIGH (ref 65–99)
Glucose-Capillary: 203 mg/dL — ABNORMAL HIGH (ref 65–99)
Glucose-Capillary: 254 mg/dL — ABNORMAL HIGH (ref 65–99)

## 2016-06-03 LAB — FIBRINOGEN: Fibrinogen: 369 mg/dL (ref 210–475)

## 2016-06-03 LAB — ABO/RH: ABO/RH(D): O NEG

## 2016-06-03 LAB — APTT: aPTT: 25 seconds (ref 24–36)

## 2016-06-03 LAB — LACTATE DEHYDROGENASE: LDH: 293 U/L — AB (ref 98–192)

## 2016-06-03 LAB — D-DIMER, QUANTITATIVE (NOT AT ARMC): D DIMER QUANT: 4.98 ug{FEU}/mL — AB (ref 0.00–0.50)

## 2016-06-03 LAB — PROTIME-INR
INR: 1.07
PROTHROMBIN TIME: 14 s (ref 11.4–15.2)

## 2016-06-03 LAB — SAVE SMEAR

## 2016-06-03 MED ORDER — CEFAZOLIN IN D5W 1 GM/50ML IV SOLN
1.0000 g | Freq: Two times a day (BID) | INTRAVENOUS | Status: DC
  Filled 2016-06-03 (×2): qty 50

## 2016-06-03 MED ORDER — CEFAZOLIN SODIUM 1 G IJ SOLR
1.5000 g | INTRAMUSCULAR | Status: DC
  Filled 2016-06-03 (×3): qty 20

## 2016-06-03 MED ORDER — SODIUM CHLORIDE 0.9 % IV SOLN: Freq: Once | INTRAVENOUS | Status: DC

## 2016-06-03 NOTE — Progress Notes (Signed)
  Pharmacy Antibiotic Note  Tracy Barker is a 71 y.o. female admitted on 05/27/2016 with MSSA bacteremia and cavitary PNA.  Pharmacy has been consulted for cefazolin dosing. Patient has zosyn allergy listed and after further discussion with the patient she reports that she got a rash with zosyn. She does state that she has tolerated cephalexin, a first-generation cephalosporin in the past.   The patient has had an acute kidney injury this admission with CRRT stopped on 1/13.   Patient lost IV access, and there is a possibility that they will be unable to place HD cath d/t thrombocytopenia tomorrow. The thrombocytopenia also limits po options (zyvox). D/w Dr.Comer and ID pharmacist and will change to IM cefazolin for now, will dose adjust to q24 given renal failure.   Plan: 1. Adjust Cefazolin to 1.5g IV every 24 hours 2. Noted ID plans for 6 weeks of antibiotics 3. Will monitor renal function and UOP closely and adjust cefazolin accordingly  Height: 5\' 2"  (157.5 cm) (estimated) Weight: 168 lb (76.2 kg) IBW/kg (Calculated) : 50.1  Temp (24hrs), Avg:97.6 F (36.4 C), Min:97.3 F (36.3 C), Max:97.8 F (36.6 C)   Recent Labs Lab 05/31/16 0315 06/01/16 0300 06/02/16 0410 06/03/16 0621 06/03/16 0826 06/03/16 1036  WBC 48.8* 28.6* 40.6*  --  52.1* 48.8*  CREATININE 2.44* 3.44* 3.57* 3.37* 3.26*  --     Estimated Creatinine Clearance: 15.3 mL/min (by C-G formula based on SCr of 3.26 mg/dL (H)).    Allergies  Allergen Reactions  . Zosyn [Piperacillin Sod-Tazobactam So] Rash    Patient reports she has tolerated cephalexin in the past.  . Amlodipine   . Clindamycin/Lincomycin     1/10 zyvox>> 1/14 1/10 meropenem>>1/11 1/14 Ancef >>  1/10 Resp panel: Rhino/Enterovirus 1/10 BCx: MSSA 1/10 urine: 40K yeast 1/10 resp: Few GPC in pairs, Few yeast (reincubated) 1/10 MRSA PCR neg 1/11 Trach asp: Mod staph aureus, mod C.albicans 1/11 BCx: 2/2 MSSA 1/13 BCx: 1/2 MSSA  Thank you  for allowing pharmacy to be a part of this patient's care.  Erin Hearing PharmD., BCPS Clinical Pharmacist Pager [phone] 06/03/2016 3:13 PM

## 2016-06-03 NOTE — Progress Notes (Signed)
Assessment/ Plan:   1. Nonoliguric AKI on CKD stage 3: Clinically suspected ATN associated with MSSA bacteremia/sepsis. Serologies negative for ANCA vasculitis, antinuclear antibody negative and anti-GBM antibody negative. Off CRRT, pressures better. No indication for HD today, looks like her creatinine may be plateauing. Her nephrologist up in [place], [place] is Tracy Barker. Tracy Barker d/cd yesterday.  Cont to support. 2. Staphylococcus bacteremia with sepsis: MSSA bacteremia with cavitary pneumonia  on coverage with Ancef suspected to be probably from her lower extremity ulcers/osteomyelitis and without any evidence of endocarditis on TEE.    3. Abdominal pain: no clear evidence of mesenteric ischemia, CT showed intra-abdominal adenopathy/dilated gallbladder.  Subjective: Interval History: good UOP  Objective: Vital signs in last 24 hours: Temp:  [97.3 F (36.3 C)-97.8 F (36.6 C)] 97.3 F (36.3 C) (01/17 0948) Pulse Rate:  [69-90] 69 (01/17 0948) Resp:  [15-16] 16 (01/17 0948) BP: (92-119)/(39-56) 92/39 (01/17 0948) SpO2:  [94 %-100 %] 100 % (01/17 0948) Weight:  [76.2 kg (168 lb)] 76.2 kg (168 lb) (01/17 0437) Weight change: -6.296 kg (-13 lb 14.1 oz)  Intake/Output from previous day: 01/16 0701 - 01/17 0700 In: 600 [P.O.:600] Out: 1575 [Urine:1575] Intake/Output this shift: Total I/O In: 240 [P.O.:240] Out: 975 [Urine:400; Stool:575]  General appearance: alert and cooperative Chest wall: no tenderness GI: soft, non-tender; bowel sounds normal; no masses,  no organomegaly Extremities: edema 1+ edema  Lab Results:  Recent Labs  06/03/16 0826 06/03/16 1036  WBC 52.1* 48.8*  HGB 8.7* 8.2*  HCT 26.9* 25.1*  PLT 29* PENDING   BMET:  Recent Labs  06/03/16 0621 06/03/16 0826  NA 137 138  K 4.0 3.6  CL 105 104  CO2 20* 21*  GLUCOSE 124* 115*  BUN 95* 91*  CREATININE 3.37* 3.26*  CALCIUM 7.9* 8.3*   No results for input(s): PTH in the last 72 hours. Iron Studies: No  results for input(s): IRON, TIBC, TRANSFERRIN, FERRITIN in the last 72 hours. Studies/Results: No results found.  Scheduled: .  stroke: mapping our early stages of recovery book   Does not apply Once  .  ceFAZolin (ANCEF) IV  1 g Intravenous Q12H  . feeding supplement (NEPRO CARB STEADY)  237 mL Oral BID BM  . hydrocortisone sod succinate (SOLU-CORTEF) inj  50 mg Intravenous Q12H  . insulin aspart  0-20 Units Subcutaneous Q4H  . insulin glargine  30 Units Subcutaneous Q24H  . mouth rinse  15 mL Mouth Rinse BID       LOS: 7 days   Tracy Barker C 06/03/2016,11:33 AM

## 2016-06-03 NOTE — Progress Notes (Signed)
[facility]   Reason for visit: Follow up on osteomyelitis and MSSA bacteremia  Interval History: Dr. Ninfa Linden plans on surgery later this week with amputation; no fever, feels better overall Total days of antibiotics 8 Cefazolin day 4  Physical Exam: Constitutional:  Vitals:   06/03/16 0437 06/03/16 0948  BP: (!) 110/41 (!) 92/39  Pulse: 80 69  Resp: 16 16  Temp: 97.7 F (36.5 C) 97.3 F (36.3 C)   patient appears in NAD Eyes: anicteric HENT: no thrush Respiratory: Normal respiratory effort; CTA B Cardiovascular: RRR MS: left second toe the same with necrotic end without drainage  Review of Systems: Constitutional: negative for fatigue and malaise Gastrointestinal: negative for diarrhea Integument/breast: negative for rash  Lab Results  Component Value Date   WBC 48.8 (H) 06/03/2016   HGB 8.2 (L) 06/03/2016   HCT 25.1 (L) 06/03/2016   MCV 83.4 06/03/2016   PLT 30 (L) 06/03/2016    Lab Results  Component Value Date   CREATININE 3.26 (H) 06/03/2016   BUN 91 (H) 06/03/2016   NA 138 06/03/2016   K 3.6 06/03/2016   CL 104 06/03/2016   CO2 21 (L) 06/03/2016    Lab Results  Component Value Date   ALT 18 06/03/2016   AST 27 06/03/2016   ALKPHOS 315 (H) 06/03/2016     Microbiology: Recent Results (from the past 240 hour(s))  MRSA PCR Screening     Status: None   Collection Time: 05/27/16  5:27 AM  Result Value Ref Range Status   MRSA by PCR NEGATIVE NEGATIVE Final    Comment:        The GeneXpert MRSA Assay (FDA approved for NASAL specimens only), is one component of a comprehensive MRSA colonization surveillance program. It is not intended to diagnose MRSA infection nor to guide or monitor treatment for MRSA infections.   Culture, Urine     Status: Abnormal   Collection Time: 05/27/16  8:04 AM  Result Value Ref Range Status   Specimen Description URINE, CATHETERIZED  Final   Special Requests NONE  Final   Culture  40,000 COLONIES/mL YEAST (A)  Final   Report Status 05/28/2016 FINAL  Final  Respiratory Panel by PCR     Status: Abnormal   Collection Time: 05/27/16  8:04 AM  Result Value Ref Range Status   Adenovirus NOT DETECTED NOT DETECTED Final   Coronavirus 229E NOT DETECTED NOT DETECTED Final   Coronavirus HKU1 NOT DETECTED NOT DETECTED Final   Coronavirus NL63 NOT DETECTED NOT DETECTED Final   Coronavirus OC43 NOT DETECTED NOT DETECTED Final   Metapneumovirus NOT DETECTED NOT DETECTED Final   Rhinovirus / Enterovirus DETECTED (A) NOT DETECTED Final   Influenza A NOT DETECTED NOT DETECTED Final   Influenza B NOT DETECTED NOT DETECTED Final   Parainfluenza Virus 1 NOT DETECTED NOT DETECTED Final   Parainfluenza Virus 2 NOT DETECTED NOT DETECTED Final   Parainfluenza Virus 3 NOT DETECTED NOT DETECTED Final   Parainfluenza Virus 4 NOT DETECTED NOT DETECTED Final   Respiratory Syncytial Virus NOT DETECTED NOT DETECTED Final   Bordetella pertussis NOT DETECTED NOT DETECTED Final   Chlamydophila pneumoniae NOT DETECTED NOT DETECTED Final   Mycoplasma pneumoniae NOT DETECTED NOT DETECTED Final  Culture, blood (Routine X 2) w Reflex to ID Panel     Status: Abnormal   Collection Time: 05/27/16  9:05 AM  Result Value Ref Range Status   Specimen Description BLOOD RIGHT  HAND  Final   Special Requests IN PEDIATRIC BOTTLE 1CC  Final   Culture  Setup Time   Final    GRAM POSITIVE COCCI IN CLUSTERS IN PEDIATRIC BOTTLE CRITICAL RESULT CALLED TO, READ BACK BY AND VERIFIED WITH: CARON AMEND,PHARMD @0252  05/28/16 MKELLY,MLT    Culture STAPHYLOCOCCUS AUREUS (A)  Final   Report Status 05/30/2016 FINAL  Final   Organism ID, Bacteria STAPHYLOCOCCUS AUREUS  Final      Susceptibility   Staphylococcus aureus - MIC*    CIPROFLOXACIN <=0.5 SENSITIVE Sensitive     ERYTHROMYCIN >=8 RESISTANT Resistant     GENTAMICIN <=0.5 SENSITIVE Sensitive     OXACILLIN 0.5 SENSITIVE Sensitive     TETRACYCLINE <=1 SENSITIVE  Sensitive     VANCOMYCIN <=0.5 SENSITIVE Sensitive     TRIMETH/SULFA <=10 SENSITIVE Sensitive     CLINDAMYCIN <=0.25 SENSITIVE Sensitive     RIFAMPIN <=0.5 SENSITIVE Sensitive     Inducible Clindamycin NEGATIVE Sensitive     * STAPHYLOCOCCUS AUREUS  Blood Culture ID Panel (Reflexed)     Status: Abnormal   Collection Time: 05/27/16  9:05 AM  Result Value Ref Range Status   Enterococcus species NOT DETECTED NOT DETECTED Final   Vancomycin resistance NOT DETECTED NOT DETECTED Final   Listeria monocytogenes NOT DETECTED NOT DETECTED Final   Staphylococcus species DETECTED (A) NOT DETECTED Final    Comment: CRITICAL RESULT CALLED TO, READ BACK BY AND VERIFIED WITH: CARON AMEND, PHARMD @0252  05/28/16 MKELLY,MLT    Staphylococcus aureus DETECTED (A) NOT DETECTED Final    Comment: CRITICAL RESULT CALLED TO, READ BACK BY AND VERIFIED WITH: CARON AMEND,PHARMD @0252  05/28/16 MKELLY,MLT    Methicillin resistance NOT DETECTED NOT DETECTED Final   Streptococcus species NOT DETECTED NOT DETECTED Final   Streptococcus agalactiae NOT DETECTED NOT DETECTED Final   Streptococcus pneumoniae NOT DETECTED NOT DETECTED Final   Streptococcus pyogenes NOT DETECTED NOT DETECTED Final   Acinetobacter baumannii NOT DETECTED NOT DETECTED Final   Enterobacteriaceae species NOT DETECTED NOT DETECTED Final   Enterobacter cloacae complex NOT DETECTED NOT DETECTED Final   Escherichia coli NOT DETECTED NOT DETECTED Final   Klebsiella oxytoca NOT DETECTED NOT DETECTED Final   Klebsiella pneumoniae NOT DETECTED NOT DETECTED Final   Proteus species NOT DETECTED NOT DETECTED Final   Serratia marcescens NOT DETECTED NOT DETECTED Final   Carbapenem resistance NOT DETECTED NOT DETECTED Final   Haemophilus influenzae NOT DETECTED NOT DETECTED Final   Neisseria meningitidis NOT DETECTED NOT DETECTED Final   Pseudomonas aeruginosa NOT DETECTED NOT DETECTED Final   Candida albicans NOT DETECTED NOT DETECTED Final   Candida  glabrata NOT DETECTED NOT DETECTED Final   Candida krusei NOT DETECTED NOT DETECTED Final   Candida parapsilosis NOT DETECTED NOT DETECTED Final   Candida tropicalis NOT DETECTED NOT DETECTED Final  Culture, blood (Routine X 2) w Reflex to ID Panel     Status: Abnormal   Collection Time: 05/27/16  9:12 AM  Result Value Ref Range Status   Specimen Description BLOOD CENTRAL LINE  Final   Special Requests IN PEDIATRIC BOTTLE 1CC  Final   Culture  Setup Time   Final    GRAM POSITIVE COCCI IN CLUSTERS IN PEDIATRIC BOTTLE CRITICAL VALUE NOTED.  VALUE IS CONSISTENT WITH PREVIOUSLY REPORTED AND CALLED VALUE.    Culture (A)  Final    STAPHYLOCOCCUS AUREUS SUSCEPTIBILITIES PERFORMED ON PREVIOUS CULTURE WITHIN THE LAST 5 DAYS.    Report Status 05/30/2016 FINAL  Final  Culture, blood (routine x 2)     Status: Abnormal   Collection Time: 05/28/16 12:00 PM  Result Value Ref Range Status   Specimen Description BLOOD RIGHT HAND  Final   Special Requests IN PEDIATRIC BOTTLE  .Garibaldi  Final   Culture  Setup Time   Final    GRAM POSITIVE COCCI IN CLUSTERS IN PEDIATRIC BOTTLE CRITICAL VALUE NOTED.  VALUE IS CONSISTENT WITH PREVIOUSLY REPORTED AND CALLED VALUE. CRITICAL RESULT CALLED TO, READ BACK BY AND VERIFIED WITH: CARON AMEND,PHARMD @0710  05/29/16 MKELLY,MLT    Culture (A)  Final    STAPHYLOCOCCUS AUREUS SUSCEPTIBILITIES PERFORMED ON PREVIOUS CULTURE WITHIN THE LAST 5 DAYS.    Report Status 05/30/2016 FINAL  Final  Culture, respiratory (NON-Expectorated)     Status: None   Collection Time: 05/28/16 12:58 PM  Result Value Ref Range Status   Specimen Description TRACHEAL ASPIRATE  Final   Special Requests Normal  Final   Gram Stain   Final    ABUNDANT WBC PRESENT,BOTH PMN AND MONONUCLEAR FEW GRAM POSITIVE COCCI IN PAIRS FEW YEAST    Culture   Final    MODERATE STAPHYLOCOCCUS AUREUS MODERATE CANDIDA ALBICANS    Report Status 06/01/2016 FINAL  Final   Organism ID, Bacteria STAPHYLOCOCCUS  AUREUS  Final      Susceptibility   Staphylococcus aureus - MIC*    CIPROFLOXACIN <=0.5 SENSITIVE Sensitive     ERYTHROMYCIN >=8 RESISTANT Resistant     GENTAMICIN <=0.5 SENSITIVE Sensitive     OXACILLIN 0.5 SENSITIVE Sensitive     TETRACYCLINE <=1 SENSITIVE Sensitive     VANCOMYCIN <=0.5 SENSITIVE Sensitive     TRIMETH/SULFA <=10 SENSITIVE Sensitive     CLINDAMYCIN <=0.25 SENSITIVE Sensitive     RIFAMPIN <=0.5 SENSITIVE Sensitive     Inducible Clindamycin NEGATIVE Sensitive     * MODERATE STAPHYLOCOCCUS AUREUS  Culture, blood (Routine X 2) w Reflex to ID Panel     Status: Abnormal (Preliminary result)   Collection Time: 05/30/16  9:33 AM  Result Value Ref Range Status   Specimen Description BLOOD RIGHT ANTECUBITAL  Final   Special Requests BOTTLES DRAWN AEROBIC AND ANAEROBIC 5CC EA  Final   Culture  Setup Time   Final    IN BOTH AEROBIC AND ANAEROBIC BOTTLES GRAM POSITIVE COCCI IN CLUSTERS CRITICAL RESULT CALLED TO, READ BACK BY AND VERIFIED WITH: TO KHAMMONS(PHRMD)BY TCLEVELAND 06/02/2016 AT 5:09AM    Culture (A)  Final    STAPHYLOCOCCUS AUREUS SUSCEPTIBILITIES PERFORMED ON PREVIOUS CULTURE WITHIN THE LAST 5 DAYS.    Report Status PENDING  Incomplete  Blood Culture ID Panel (Reflexed)     Status: Abnormal   Collection Time: 05/30/16  9:33 AM  Result Value Ref Range Status   Enterococcus species NOT DETECTED NOT DETECTED Final   Listeria monocytogenes NOT DETECTED NOT DETECTED Final   Staphylococcus species DETECTED (A) NOT DETECTED Final    Comment: CRITICAL RESULT CALLED TO, READ BACK BY AND VERIFIED WITH: TO KHAMMONS(PHARMD) BY TCLEVELAND 06/04/2016 AT 5:04AM CORRECT CALL DATE 06/02/2016    Staphylococcus aureus DETECTED (A) NOT DETECTED Final    Comment: CRITICAL RESULT CALLED TO, READ BACK BY AND VERIFIED WITH: TO KHAMMONS(PHARMD) BY TCLEVELAND 06/04/2016 AT 5:04AM CORRECT CALL DATE 06/02/2016    Methicillin resistance NOT DETECTED NOT DETECTED Final   Streptococcus  species NOT DETECTED NOT DETECTED Final   Streptococcus agalactiae NOT DETECTED NOT DETECTED Final   Streptococcus pneumoniae NOT DETECTED NOT DETECTED Final   Streptococcus pyogenes NOT DETECTED  NOT DETECTED Final   Acinetobacter baumannii NOT DETECTED NOT DETECTED Final   Enterobacteriaceae species NOT DETECTED NOT DETECTED Final   Enterobacter cloacae complex NOT DETECTED NOT DETECTED Final   Escherichia coli NOT DETECTED NOT DETECTED Final   Klebsiella oxytoca NOT DETECTED NOT DETECTED Final   Klebsiella pneumoniae NOT DETECTED NOT DETECTED Final   Proteus species NOT DETECTED NOT DETECTED Final   Serratia marcescens NOT DETECTED NOT DETECTED Final   Haemophilus influenzae NOT DETECTED NOT DETECTED Final   Neisseria meningitidis NOT DETECTED NOT DETECTED Final   Pseudomonas aeruginosa NOT DETECTED NOT DETECTED Final   Candida albicans NOT DETECTED NOT DETECTED Final   Candida glabrata NOT DETECTED NOT DETECTED Final   Candida krusei NOT DETECTED NOT DETECTED Final   Candida parapsilosis NOT DETECTED NOT DETECTED Final   Candida tropicalis NOT DETECTED NOT DETECTED Final  Culture, blood (Routine X 2) w Reflex to ID Panel     Status: None (Preliminary result)   Collection Time: 05/30/16  9:41 AM  Result Value Ref Range Status   Specimen Description BLOOD LEFT ANTECUBITAL  Final   Special Requests BOTTLES DRAWN AEROBIC AND ANAEROBIC 8CC EA  Final   Culture NO GROWTH 3 DAYS  Final   Report Status PENDING  Incomplete    Impression/Plan:  1. MSSA bacteremia -  On cefazolin.  TEE negative for vegetation but repeat blood culture 1/13 again positive in 1/2.  Will repeat today.  2. Osteomyelitis of second left toe - Dr. Ninfa Linden to amputate.  If gross margins clear, she will not need prolonged IV therapy with cefazolin and duration of 2 weeks for #1 will be adequate as long as blood cultures are not again positive.  3.  Access - renal issues so will need central line like IJ at discharge

## 2016-06-03 NOTE — Progress Notes (Signed)
Attending made aware of critical care note regarding line holiday. Decision made to cancel PICC placement. Pharmacy made aware that there was no IV access, IM antibiotics ordered attending made aware. Blood bank made aware platelets will not be transfused today

## 2016-06-03 NOTE — Progress Notes (Signed)
CRITICAL VALUE ALERT  Critical value received:  WBC: 52.1 Platelets: 29  Date of notification:  1/17  Time of notification:  9:45  Critical value read back:Yes.    Nurse who received alert:  Kathyrn Sheriff   MD notified (1st page):  Rizwan   Time of first page:  9:58 am   MD notified (2nd page):  Time of second page:  Responding MD:  Wynelle Cleveland  Time MD responded:  10:02 am

## 2016-06-03 NOTE — Discharge Summary (Signed)
PROGRESS NOTE    Tracy Barker   [id number]  DOB: Apr 23, 1946  DOA: 05/27/2016 PCP: No primary care provider on file.   Brief Narrative:  71 year old female with PMH significant for DM, HTN, and CLL.   She was recently been seen there for cough x 1 month treated with Zpak, and fall with no apparent injury. 1/9 she presented to [facility] for weakness x about 24 hours and several episodes of vomiting.  EMS was called and upon their arrival she was alert and oriented. In ED she was noted to additionally have some L sided weakness. Laboratory evaluation significant for hyperkalemia, renal failure, and leukocytosis. She was given broad spectrum antibiotics (mero and linezolid) and temporizing therapies for hyperkalemia were used.  Despite these therapies she became progressively more lethargic and developed shock. She was started on levophed with escalating doses and eventually was started on epinephrine as well.  She was transferred on 1/10 to [facility] for ICU admission. She was intubated for acute hypoxemic respiratory failure on arrival to [place] ICU and received CVVHD for AKI.   Subjective: No complaints. Per RN, she had a mild nose bleed yesterday.   Assessment & Plan:  MSSA bacteremia / septic shock  - extubated, off pressors - Negative TEE -Repeat blood culture 1/13  remain positive, ? Source from foot? Left foot x ray with oeteomyelitis - continue abx -  tapering stress dose steroids - ID following.  Cavitary lung masses/ acute respiratory failure  -Likely septic emboli due to bacteremia vs aspiration pneumonia with cavitations  -no longer hypoxic  Hypotension with h/o HTN - holding Catapres, Diltiazem, HCTZ, Ramipril - on stress dose steroids   CLL - chronically elevated WBC counts   Thrombocytopenia -  from infection, drugs? -  HIT panel negative -   h/o CLL, CT abd/pelvis on presentation did not reveal liver or spleen abnormalities -  platelets dropping lower today- check for DIC with INR/ PT/ PTT, Fibronogen, d dimer - due to nose bleed, will go again and give 1 u platelets  Left Wounds/ necrosis - ortho plans for "amputation of her 2nd toe with debridement of her other toe wounds vs amputation of the other toes " by the end of this week  AKI/ CKD III - ATN She required CRRT while in ICU Nephrology following  PAF - she required amiodarone drip in ICU   - diltiazem on hold due to hypotension - She has significant thrombocytopenia, not a candidate for anticoagulation.   Diabetes - poorly controlled, previously noninsulin dependent - A1c 10.3, on insulin here - Will need to discharge on insulin  Morbid obesity Body mass index is 33.27 kg/m.   DVT prophylaxis: SCDs Code Status: Full code Family Communication:  Disposition Plan: to be determined Consultants:   Ortho  ID  PCCM  Nephrology  Procedures:  Intubation on 1/10>>1/12 R fem CVL 1/10 >>> Aline placement and removal TEE on 1/12 CVVHD  STUDIES: CT head 1/9> non-acute CT abdomen pelvis 1/9 > non-acute, gallbladder distention, colonic wall thickening CT chest 1/9 >bilateral opacities and cavitary lung masses  US Renal 1/11 >no evidence of hydronephrosis; 3 cm left adrenal mass  DG Left Foot >>osteomyelitis and septic arthritis   Antimicrobials:  Anti-infectives    Start     Dose/Rate Route Frequency Ordered Stop   06/03/16 1100  ceFAZolin (ANCEF) IVPB 1 g/50 mL premix     1 g 100 mL/hr over 30 Minutes Intravenous Every 12 hours  06/03/16 1015     05/31/16 1400  ceFAZolin (ANCEF) IVPB 1 g/50 mL premix  Status:  Discontinued     1 g 100 mL/hr over 30 Minutes Intravenous Every 24 hours 05/31/16 1306 06/03/16 1015   05/28/16 1830  linezolid (ZYVOX) IVPB 600 mg  Status:  Discontinued     600 mg 300 mL/hr over 60 Minutes Intravenous Every 12 hours 05/28/16 1130 05/31/16 1235   05/28/16 0800  meropenem (MERREM) 500 mg in sodium  chloride 0.9 % 50 mL IVPB  Status:  Discontinued     500 mg 100 mL/hr over 30 Minutes Intravenous Every 24 hours 05/27/16 0743 05/27/16 1645   05/27/16 2000  meropenem (MERREM) 1 g in sodium chloride 0.9 % 100 mL IVPB  Status:  Discontinued     1 g 200 mL/hr over 30 Minutes Intravenous Every 12 hours 05/27/16 1645 05/28/16 1130   05/27/16 0730  meropenem (MERREM) 1 g in sodium chloride 0.9 % 100 mL IVPB     1 g 200 mL/hr over 30 Minutes Intravenous  Once 05/27/16 0632 05/27/16 0808   05/27/16 0730  linezolid (ZYVOX) IVPB 600 mg  Status:  Discontinued     600 mg 300 mL/hr over 60 Minutes Intravenous Every 12 hours 05/27/16 0632 05/28/16 1056       Objective: Vitals:   06/02/16 1700 06/02/16 2015 06/03/16 0437 06/03/16 0948  BP: (!) 119/49 (!) 109/56 (!) 110/41 (!) 92/39  Pulse: 81 90 80 69  Resp: 16 15 16 16   Temp: 97.4 F (36.3 C) 97.8 F (36.6 C) 97.7 F (36.5 C) 97.3 F (36.3 C)  TempSrc: Oral   Oral  SpO2: 100% 94% 100% 100%  Weight:   76.2 kg (168 lb)   Height:        Intake/Output Summary (Last 24 hours) at 06/03/16 1055 Last data filed at 06/03/16 1034  Gross per 24 hour  Intake              840 ml  Output             1575 ml  Net             -735 ml   Filed Weights   06/01/16 0100 06/01/16 2257 06/03/16 0437  Weight: 83.2 kg (183 lb 6.8 oz) 82.5 kg (181 lb 14.1 oz) 76.2 kg (168 lb)    Examination: General exam: Appears comfortable  HEENT: PERRLA, oral mucosa moist, no sclera icterus or thrush Respiratory system: Clear to auscultation. Respiratory effort normal. Cardiovascular system: S1 & S2 heard, RRR.  No murmurs  Gastrointestinal system: Abdomen soft, non-tender, nondistended. Normal bowel sound. No organomegaly Central nervous system: Alert and oriented. No focal neurological deficits. Extremities: No cyanosis, clubbing or edema Skin: necrosis and ulcers on left toes Psychiatry:  Mood & affect appropriate.     Data Reviewed: I have personally  reviewed following labs and imaging studies  CBC:  Recent Labs Lab 05/30/16 0934 05/31/16 0315 06/01/16 0300 06/02/16 0410 06/03/16 0826  WBC 47.5* 48.8* 28.6* 40.6* 52.1*  NEUTROABS 30.4* 19.5* 9.2*  --   --   HGB 11.2* 9.0* 7.6* 8.0* 8.7*  HCT 33.8* 27.1* 23.0* 24.3* 26.9*  MCV 82.8 81.9 82.4 83.2 83.3  PLT 94* 72* 40* 35* 29*   Basic Metabolic Panel:  Recent Labs Lab 05/28/16 0303  05/29/16 0320 05/29/16 1600 05/30/16 0500 05/30/16 1741 05/31/16 0315 06/01/16 0300 06/02/16 0410 06/03/16 0621 06/03/16 0826  NA 138  < > 139  137 140 133* 134* 135 134* 137 138  K 4.4  < > 4.6 5.0 4.9 5.4* 4.9 4.7 4.8 4.0 3.6  CL 93*  < > 100* 101 104 100* 102 102 103 105 104  CO2 23  < > 26 24 23 22 22 22  20* 20* 21*  GLUCOSE 206*  < > 210* 220* 181* 458* 168* 140* 108* 124* 115*  BUN 78*  < > 36* 30* 27* 38* 47* 69* 86* 95* 91*  CREATININE 3.58*  < > 1.94* 1.64* 1.45* 2.07* 2.44* 3.44* 3.57* 3.37* 3.26*  CALCIUM 6.0*  < > 7.2* 7.4* 7.5* 7.3* 7.8* 8.2* 8.1* 7.9* 8.3*  MG 1.5*  --  2.4  --  2.5*  --  2.4  --   --   --   --   PHOS 4.5  < > 4.2 4.3 4.1 5.4* 4.8*  --   --   --   --   < > = values in this interval not displayed. GFR: Estimated Creatinine Clearance: 15.3 mL/min (by C-G formula based on SCr of 3.26 mg/dL (H)). Liver Function Tests:  Recent Labs Lab 05/29/16 1600 05/30/16 0500 05/30/16 1741 05/31/16 0315 06/03/16 0621  AST  --   --   --   --  27  ALT  --   --   --   --  18  ALKPHOS  --   --   --   --  315*  BILITOT  --   --   --   --  0.9  PROT  --   --   --   --  3.6*  ALBUMIN 1.7* 1.8* 1.6* 1.8* 1.7*   No results for input(s): LIPASE, AMYLASE in the last 168 hours. No results for input(s): AMMONIA in the last 168 hours. Coagulation Profile: No results for input(s): INR, PROTIME in the last 168 hours. Cardiac Enzymes:  Recent Labs Lab 05/27/16 1739 05/28/16 0303 05/28/16 0826 05/28/16 1211 05/28/16 1415 05/28/16 2049 05/29/16 0320  CKTOTAL 1,191*  1,239*  --  1,090*  --   --  412*  TROPONINI 0.44*  --  0.35*  --  0.32* 0.22*  --    BNP (last 3 results) No results for input(s): PROBNP in the last 8760 hours. HbA1C: No results for input(s): HGBA1C in the last 72 hours. CBG:  Recent Labs Lab 06/02/16 1743 06/02/16 2017 06/03/16 0025 06/03/16 0436 06/03/16 0809  GLUCAP 164* 294* 135* 127* 106*   Lipid Profile: No results for input(s): CHOL, HDL, LDLCALC, TRIG, CHOLHDL, LDLDIRECT in the last 72 hours. Thyroid Function Tests: No results for input(s): TSH, T4TOTAL, FREET4, T3FREE, THYROIDAB in the last 72 hours. Anemia Panel: No results for input(s): VITAMINB12, FOLATE, FERRITIN, TIBC, IRON, RETICCTPCT in the last 72 hours. Urine analysis:    Component Value Date/Time   COLORURINE AMBER (A) 05/27/2016 0804   APPEARANCEUR CLOUDY (A) 05/27/2016 0804   LABSPEC 1.023 05/27/2016 0804   PHURINE 5.0 05/27/2016 0804   GLUCOSEU 150 (A) 05/27/2016 0804   HGBUR SMALL (A) 05/27/2016 0804   BILIRUBINUR NEGATIVE 05/27/2016 0804   KETONESUR NEGATIVE 05/27/2016 0804   PROTEINUR 100 (A) 05/27/2016 0804   NITRITE NEGATIVE 05/27/2016 0804   LEUKOCYTESUR NEGATIVE 05/27/2016 0804   Sepsis Labs: @LABRCNTIP (procalcitonin:4,lacticidven:4) ) Recent Results (from the past 240 hour(s))  MRSA PCR Screening     Status: None   Collection Time: 05/27/16  5:27 AM  Result Value Ref Range Status   MRSA by PCR NEGATIVE NEGATIVE  Final    Comment:        The GeneXpert MRSA Assay (FDA approved for NASAL specimens only), is one component of a comprehensive MRSA colonization surveillance program. It is not intended to diagnose MRSA infection nor to guide or monitor treatment for MRSA infections.   Culture, Urine     Status: Abnormal   Collection Time: 05/27/16  8:04 AM  Result Value Ref Range Status   Specimen Description URINE, CATHETERIZED  Final   Special Requests NONE  Final   Culture 40,000 COLONIES/mL YEAST (A)  Final   Report Status  05/28/2016 FINAL  Final  Respiratory Panel by PCR     Status: Abnormal   Collection Time: 05/27/16  8:04 AM  Result Value Ref Range Status   Adenovirus NOT DETECTED NOT DETECTED Final   Coronavirus 229E NOT DETECTED NOT DETECTED Final   Coronavirus HKU1 NOT DETECTED NOT DETECTED Final   Coronavirus NL63 NOT DETECTED NOT DETECTED Final   Coronavirus OC43 NOT DETECTED NOT DETECTED Final   Metapneumovirus NOT DETECTED NOT DETECTED Final   Rhinovirus / Enterovirus DETECTED (A) NOT DETECTED Final   Influenza A NOT DETECTED NOT DETECTED Final   Influenza B NOT DETECTED NOT DETECTED Final   Parainfluenza Virus 1 NOT DETECTED NOT DETECTED Final   Parainfluenza Virus 2 NOT DETECTED NOT DETECTED Final   Parainfluenza Virus 3 NOT DETECTED NOT DETECTED Final   Parainfluenza Virus 4 NOT DETECTED NOT DETECTED Final   Respiratory Syncytial Virus NOT DETECTED NOT DETECTED Final   Bordetella pertussis NOT DETECTED NOT DETECTED Final   Chlamydophila pneumoniae NOT DETECTED NOT DETECTED Final   Mycoplasma pneumoniae NOT DETECTED NOT DETECTED Final  Culture, blood (Routine X 2) w Reflex to ID Panel     Status: Abnormal   Collection Time: 05/27/16  9:05 AM  Result Value Ref Range Status   Specimen Description BLOOD RIGHT HAND  Final   Special Requests IN PEDIATRIC BOTTLE 1CC  Final   Culture  Setup Time   Final    GRAM POSITIVE COCCI IN CLUSTERS IN PEDIATRIC BOTTLE CRITICAL RESULT CALLED TO, READ BACK BY AND VERIFIED WITH: CARON AMEND,PHARMD @0252  05/28/16 MKELLY,MLT    Culture STAPHYLOCOCCUS AUREUS (A)  Final   Report Status 05/30/2016 FINAL  Final   Organism ID, Bacteria STAPHYLOCOCCUS AUREUS  Final      Susceptibility   Staphylococcus aureus - MIC*    CIPROFLOXACIN <=0.5 SENSITIVE Sensitive     ERYTHROMYCIN >=8 RESISTANT Resistant     GENTAMICIN <=0.5 SENSITIVE Sensitive     OXACILLIN 0.5 SENSITIVE Sensitive     TETRACYCLINE <=1 SENSITIVE Sensitive     VANCOMYCIN <=0.5 SENSITIVE Sensitive      TRIMETH/SULFA <=10 SENSITIVE Sensitive     CLINDAMYCIN <=0.25 SENSITIVE Sensitive     RIFAMPIN <=0.5 SENSITIVE Sensitive     Inducible Clindamycin NEGATIVE Sensitive     * STAPHYLOCOCCUS AUREUS  Blood Culture ID Panel (Reflexed)     Status: Abnormal   Collection Time: 05/27/16  9:05 AM  Result Value Ref Range Status   Enterococcus species NOT DETECTED NOT DETECTED Final   Vancomycin resistance NOT DETECTED NOT DETECTED Final   Listeria monocytogenes NOT DETECTED NOT DETECTED Final   Staphylococcus species DETECTED (A) NOT DETECTED Final    Comment: CRITICAL RESULT CALLED TO, READ BACK BY AND VERIFIED WITH: CARON AMEND, PHARMD @0252  05/28/16 MKELLY,MLT    Staphylococcus aureus DETECTED (A) NOT DETECTED Final    Comment: CRITICAL RESULT CALLED TO, READ BACK  BY AND VERIFIED WITH: CARON AMEND,PHARMD @0252  05/28/16 MKELLY,MLT    Methicillin resistance NOT DETECTED NOT DETECTED Final   Streptococcus species NOT DETECTED NOT DETECTED Final   Streptococcus agalactiae NOT DETECTED NOT DETECTED Final   Streptococcus pneumoniae NOT DETECTED NOT DETECTED Final   Streptococcus pyogenes NOT DETECTED NOT DETECTED Final   Acinetobacter baumannii NOT DETECTED NOT DETECTED Final   Enterobacteriaceae species NOT DETECTED NOT DETECTED Final   Enterobacter cloacae complex NOT DETECTED NOT DETECTED Final   Escherichia coli NOT DETECTED NOT DETECTED Final   Klebsiella oxytoca NOT DETECTED NOT DETECTED Final   Klebsiella pneumoniae NOT DETECTED NOT DETECTED Final   Proteus species NOT DETECTED NOT DETECTED Final   Serratia marcescens NOT DETECTED NOT DETECTED Final   Carbapenem resistance NOT DETECTED NOT DETECTED Final   Haemophilus influenzae NOT DETECTED NOT DETECTED Final   Neisseria meningitidis NOT DETECTED NOT DETECTED Final   Pseudomonas aeruginosa NOT DETECTED NOT DETECTED Final   Candida albicans NOT DETECTED NOT DETECTED Final   Candida glabrata NOT DETECTED NOT DETECTED Final   Candida  krusei NOT DETECTED NOT DETECTED Final   Candida parapsilosis NOT DETECTED NOT DETECTED Final   Candida tropicalis NOT DETECTED NOT DETECTED Final  Culture, blood (Routine X 2) w Reflex to ID Panel     Status: Abnormal   Collection Time: 05/27/16  9:12 AM  Result Value Ref Range Status   Specimen Description BLOOD CENTRAL LINE  Final   Special Requests IN PEDIATRIC BOTTLE 1CC  Final   Culture  Setup Time   Final    GRAM POSITIVE COCCI IN CLUSTERS IN PEDIATRIC BOTTLE CRITICAL VALUE NOTED.  VALUE IS CONSISTENT WITH PREVIOUSLY REPORTED AND CALLED VALUE.    Culture (A)  Final    STAPHYLOCOCCUS AUREUS SUSCEPTIBILITIES PERFORMED ON PREVIOUS CULTURE WITHIN THE LAST 5 DAYS.    Report Status 05/30/2016 FINAL  Final  Culture, blood (routine x 2)     Status: Abnormal   Collection Time: 05/28/16 12:00 PM  Result Value Ref Range Status   Specimen Description BLOOD RIGHT HAND  Final   Special Requests IN PEDIATRIC BOTTLE  .[place]  Final   Culture  Setup Time   Final    GRAM POSITIVE COCCI IN CLUSTERS IN PEDIATRIC BOTTLE CRITICAL VALUE NOTED.  VALUE IS CONSISTENT WITH PREVIOUSLY REPORTED AND CALLED VALUE. CRITICAL RESULT CALLED TO, READ BACK BY AND VERIFIED WITH: CARON AMEND,PHARMD @0710  05/29/16 MKELLY,MLT    Culture (A)  Final    STAPHYLOCOCCUS AUREUS SUSCEPTIBILITIES PERFORMED ON PREVIOUS CULTURE WITHIN THE LAST 5 DAYS.    Report Status 05/30/2016 FINAL  Final  Culture, respiratory (NON-Expectorated)     Status: None   Collection Time: 05/28/16 12:58 PM  Result Value Ref Range Status   Specimen Description TRACHEAL ASPIRATE  Final   Special Requests Normal  Final   Gram Stain   Final    ABUNDANT WBC PRESENT,BOTH PMN AND MONONUCLEAR FEW GRAM POSITIVE COCCI IN PAIRS FEW YEAST    Culture   Final    MODERATE STAPHYLOCOCCUS AUREUS MODERATE CANDIDA ALBICANS    Report Status 06/01/2016 FINAL  Final   Organism ID, Bacteria STAPHYLOCOCCUS AUREUS  Final      Susceptibility   Staphylococcus  aureus - MIC*    CIPROFLOXACIN <=0.5 SENSITIVE Sensitive     ERYTHROMYCIN >=8 RESISTANT Resistant     GENTAMICIN <=0.5 SENSITIVE Sensitive     OXACILLIN 0.5 SENSITIVE Sensitive     TETRACYCLINE <=1 SENSITIVE Sensitive  VANCOMYCIN <=0.5 SENSITIVE Sensitive     TRIMETH/SULFA <=10 SENSITIVE Sensitive     CLINDAMYCIN <=0.25 SENSITIVE Sensitive     RIFAMPIN <=0.5 SENSITIVE Sensitive     Inducible Clindamycin NEGATIVE Sensitive     * MODERATE STAPHYLOCOCCUS AUREUS  Culture, blood (Routine X 2) w Reflex to ID Panel     Status: None (Preliminary result)   Collection Time: 05/30/16  9:33 AM  Result Value Ref Range Status   Specimen Description BLOOD RIGHT ANTECUBITAL  Final   Special Requests BOTTLES DRAWN AEROBIC AND ANAEROBIC 5CC EA  Final   Culture  Setup Time   Final    IN BOTH AEROBIC AND ANAEROBIC BOTTLES GRAM POSITIVE COCCI IN CLUSTERS Organism ID to follow CRITICAL RESULT CALLED TO, READ BACK BY AND VERIFIED WITH: TO KHAMMONS(PHRMD)BY TCLEVELAND 06/02/2016 AT 5:09AM    Culture NO GROWTH 3 DAYS  Final   Report Status PENDING  Incomplete  Blood Culture ID Panel (Reflexed)     Status: Abnormal   Collection Time: 05/30/16  9:33 AM  Result Value Ref Range Status   Enterococcus species NOT DETECTED NOT DETECTED Final   Listeria monocytogenes NOT DETECTED NOT DETECTED Final   Staphylococcus species DETECTED (A) NOT DETECTED Final    Comment: CRITICAL RESULT CALLED TO, READ BACK BY AND VERIFIED WITH: TO KHAMMONS(PHARMD) BY TCLEVELAND 06/04/2016 AT 5:04AM CORRECT CALL DATE 06/02/2016    Staphylococcus aureus DETECTED (A) NOT DETECTED Final    Comment: CRITICAL RESULT CALLED TO, READ BACK BY AND VERIFIED WITH: TO KHAMMONS(PHARMD) BY TCLEVELAND 06/04/2016 AT 5:04AM CORRECT CALL DATE 06/02/2016    Methicillin resistance NOT DETECTED NOT DETECTED Final   Streptococcus species NOT DETECTED NOT DETECTED Final   Streptococcus agalactiae NOT DETECTED NOT DETECTED Final   Streptococcus  pneumoniae NOT DETECTED NOT DETECTED Final   Streptococcus pyogenes NOT DETECTED NOT DETECTED Final   Acinetobacter baumannii NOT DETECTED NOT DETECTED Final   Enterobacteriaceae species NOT DETECTED NOT DETECTED Final   Enterobacter cloacae complex NOT DETECTED NOT DETECTED Final   Escherichia coli NOT DETECTED NOT DETECTED Final   Klebsiella oxytoca NOT DETECTED NOT DETECTED Final   Klebsiella pneumoniae NOT DETECTED NOT DETECTED Final   Proteus species NOT DETECTED NOT DETECTED Final   Serratia marcescens NOT DETECTED NOT DETECTED Final   Haemophilus influenzae NOT DETECTED NOT DETECTED Final   Neisseria meningitidis NOT DETECTED NOT DETECTED Final   Pseudomonas aeruginosa NOT DETECTED NOT DETECTED Final   Candida albicans NOT DETECTED NOT DETECTED Final   Candida glabrata NOT DETECTED NOT DETECTED Final   Candida krusei NOT DETECTED NOT DETECTED Final   Candida parapsilosis NOT DETECTED NOT DETECTED Final   Candida tropicalis NOT DETECTED NOT DETECTED Final  Culture, blood (Routine X 2) w Reflex to ID Panel     Status: None (Preliminary result)   Collection Time: 05/30/16  9:41 AM  Result Value Ref Range Status   Specimen Description BLOOD LEFT ANTECUBITAL  Final   Special Requests BOTTLES DRAWN AEROBIC AND ANAEROBIC 8CC EA  Final   Culture NO GROWTH 3 DAYS  Final   Report Status PENDING  Incomplete         Radiology Studies: No results found.    Scheduled Meds: .  stroke: mapping our early stages of recovery book   Does not apply Once  .  ceFAZolin (ANCEF) IV  1 g Intravenous Q12H  . feeding supplement (NEPRO CARB STEADY)  237 mL Oral BID BM  . hydrocortisone sod succinate (SOLU-CORTEF) inj  50 mg Intravenous Q12H  . insulin aspart  0-20 Units Subcutaneous Q4H  . insulin glargine  30 Units Subcutaneous Q24H  . mouth rinse  15 mL Mouth Rinse BID   Continuous Infusions:   LOS: 7 days    Time spent in minutes: 41    Bethany Beach, MD [facility] Pager: [email or web address] Password [email or web address] 06/03/2016, 10:55 AM

## 2016-06-03 NOTE — Progress Notes (Signed)
Occupational Therapy Treatment Patient Details Name: Tracy Barker MRN: [id number] DOB: 12/14/1945 Today's Date: 06/03/2016    History of present illness This 71 y.o. female transferred from [facility] ED with septic shock  due to staph areus thought to be due to osteomyelitis of Lt second toe .  Pt was intubated 05/27/16 >>05/29/16.  PMH includes:  CLL, DM, HTN, and h/o falls per H&P she was a "frequent flyer" in ED typically for falls     OT comments  Pt oob to chair to complete grooming task. Pt able to power up from bed with min (A). Pt reports working at a [place] in [place], [place]. OT recommends SNF for d/c planning due to fall risk and decr activity tolerance.   Follow Up Recommendations  SNF    Equipment Recommendations  None recommended by OT    Recommendations for Other Services      Precautions / Restrictions Precautions Precautions: Fall Precaution Comments: IV, foley, flexiseal Required Braces or Orthoses: Other Brace/Splint Other Brace/Splint: LLE post op boot       Mobility Bed Mobility Overal bed mobility: Needs Assistance Bed Mobility: Supine to Sit     Supine to sit: Min guard     General bed mobility comments: pt with HOB elevated and use of L side rail able to progress to EOB without (A)  Transfers Overall transfer level: Needs assistance Equipment used: Rolling walker (2 wheeled) Transfers: Sit to/from Stand Sit to Stand: Min assist         General transfer comment: pt completed tranfer to chair on L side    Balance Overall balance assessment: Needs assistance Sitting-balance support: Bilateral upper extremity supported;Feet supported Sitting balance-Leahy Scale: Fair     Standing balance support: Bilateral upper extremity supported;During functional activity Standing balance-Leahy Scale: Poor Standing balance comment: reliance on RW                   ADL Overall ADL's : Needs assistance/impaired Eating/Feeding: Set up;Bed level    Grooming: Wash/dry hands;Wash/dry face;Oral care;Set up;Sitting Grooming Details (indicate cue type and reason): up in chair with items on side tray                 Toilet Transfer: Minimal assistance Toilet Transfer Details (indicate cue type and reason): simulated sit<>stand EOB to chair           General ADL Comments: pt reports not being out of bed in 48 hours and requesting to get to chair      Vision                     Perception     Praxis      Cognition   Behavior During Therapy: [facility] for tasks assessed/performed Overall Cognitive Status: Within Functional Limits for tasks assessed                  General Comments: pt with hx of falls    Extremity/Trunk Assessment               Exercises     Shoulder Instructions       General Comments      Pertinent Vitals/ Pain       Pain Assessment: No/denies pain  Home Living  Prior Functioning/Environment              Frequency  Min 2X/week        Progress Toward Goals  OT Goals(current goals can now be found in the care plan section)  Progress towards OT goals: Progressing toward goals  Acute Rehab OT Goals Patient Stated Goal: to get home OT Goal Formulation: With patient Time For Goal Achievement: 06/13/16 Potential to Achieve Goals: Good ADL Goals Pt Will Perform Grooming: with mod assist;sitting Pt Will Perform Upper Body Bathing: with mod assist;sitting Pt/caregiver will Perform Home Exercise Program: Increased strength;Right Upper extremity;Left upper extremity;With minimal assist;With written HEP provided Additional ADL Goal #1: Pt will tolerate EOB x 15 mins with mod A in prep for ADLs   Plan Discharge plan remains appropriate    Co-evaluation                 End of Session Equipment Utilized During Treatment: Rolling walker;Gait belt   Activity Tolerance Patient tolerated treatment  well   Patient Left in chair;with call bell/phone within reach;with chair alarm set   Nurse Communication Mobility status;Precautions        Time: [id number] OT Time Calculation (min): 21 min  Charges: OT General Charges $OT Visit: 1 Procedure OT Treatments $Self Care/Home Management : 8-22 mins  Parke Poisson B 06/03/2016, 9:05 AM   Jeri Modena   OTR/L Pager: [phone] Office: [phone] .

## 2016-06-03 NOTE — Progress Notes (Signed)
Late Entry  On 1/16 spoke with Dr.Xu about foley, felt we should continue it to monitor intake and output.

## 2016-06-03 NOTE — Progress Notes (Signed)
Physical Therapy Treatment Patient Details Name: Tracy Barker MRN: [id number] DOB: 04/11/1946 Today's Date: 06/03/2016    History of Present Illness This 71 y.o. female transferred from [facility] ED with septic shock  due to staph areus thought to be due to osteomyelitis of Lt second toe .  Pt was intubated 05/27/16 >>05/29/16.  PMH includes:  CLL, DM, HTN, and h/o falls per H&P she was a "frequent flyer" in ED typically for falls      PT Comments    Progressing steadily with general mobility and gait stability and tolerance, but fatigues noticeably and needs extended rests in between mobility trials.  Follow Up Recommendations  SNF;Supervision/Assistance - 24 hour     Equipment Recommendations  None recommended by PT    Recommendations for Other Services       Precautions / Restrictions Precautions Precautions: Fall Precaution Comments: IV, foley, flexiseal Other Brace/Splint: LLE post op boot    Mobility  Bed Mobility Overal bed mobility: Needs Assistance Bed Mobility: Supine to Sit     Supine to sit: Min assist     General bed mobility comments: assist to roll up onto R elbow to come up  Transfers Overall transfer level: Needs assistance Equipment used: Rolling walker (2 wheeled) Transfers: Sit to/from Stand Sit to Stand: Min assist         General transfer comment: cues for hand placement and minor assist to come forward.  Ambulation/Gait Ambulation/Gait assistance: Min guard;Min assist;+2 safety/equipment Ambulation Distance (Feet): 12 Feet (then 30, and addidtional 25 with rests in between) Assistive device: Rolling walker (2 wheeled) Gait Pattern/deviations: Step-through pattern Gait velocity: slower Gait velocity interpretation: Below normal speed for age/gender General Gait Details: cues for posture, position in RW and safety with chair to follow. Pt walked 10', 35' respectively with seated rest between trials   Stairs            Wheelchair  Mobility    Modified Rankin (Stroke Patients Only)       Balance     Sitting balance-Leahy Scale: Fair       Standing balance-Leahy Scale: Poor Standing balance comment: reliance on RW                    Cognition Arousal/Alertness: Awake/alert Behavior During Therapy: WFL for tasks assessed/performed Overall Cognitive Status: Within Functional Limits for tasks assessed                 General Comments: pt with hx of falls    Exercises      General Comments        Pertinent Vitals/Pain Pain Assessment: No/denies pain    Home Living                      Prior Function            PT Goals (current goals can now be found in the care plan section) Acute Rehab PT Goals Patient Stated Goal: to get home PT Goal Formulation: With patient Time For Goal Achievement: 06/15/16 Potential to Achieve Goals: Good Progress towards PT goals: Progressing toward goals    Frequency    Min 3X/week      PT Plan Current plan remains appropriate    Co-evaluation             End of Session   Activity Tolerance: Patient tolerated treatment well Patient left: in chair;with call bell/phone within reach;with chair alarm set  Time: 1445-1510 PT Time Calculation (min) (ACUTE ONLY): 25 min  Charges:  $Gait Training: 8-22 mins $Therapeutic Activity: 8-22 mins                    G CodesTessie Fass Ashle Barker 06/03/2016, 4:01 PM 06/03/2016  Tracy Barker, [place] [phone]  (pager)

## 2016-06-03 NOTE — Progress Notes (Signed)
  Pharmacy Antibiotic Note  Tracy Barker is a 71 y.o. female admitted on 05/27/2016 with MSSA bacteremia and cavitary PNA.  Pharmacy has been consulted for cefazolin dosing. Patient has zosyn allergy listed and after further discussion with the patient she reports that she got a rash with zosyn. She does state that she has tolerated cephalexin, a first-generation cephalosporin in the past.   The patient has had an acute kidney injury this admission with CRRT stopped on 1/13. The patient's SCr worsened off of CRRT as expected but has now peaked and appears to be trending down, SCr 3.26 << 3.37, CrCl~10-20 ml/min. UOP up to 0.9 ml/kg/hr. Will adjust Cefazolin dose today with plans to adjust more if renal function continues to improve.  Plan: 1. Adjust Cefazolin to 1g IV every 12 hours 2. Noted ID plans for 6 weeks of antibiotics 3. Will monitor renal function and UOP closely and adjust cefazolin accordingly  Height: 5\' 2"  (157.5 cm) (estimated) Weight: 168 lb (76.2 kg) IBW/kg (Calculated) : 50.1  Temp (24hrs), Avg:97.6 F (36.4 C), Min:97.3 F (36.3 C), Max:97.8 F (36.6 C)   Recent Labs Lab 05/30/16 0934  05/31/16 0315 06/01/16 0300 06/02/16 0410 06/03/16 0621 06/03/16 0826  WBC 47.5*  --  48.8* 28.6* 40.6*  --  52.1*  CREATININE  --   < > 2.44* 3.44* 3.57* 3.37* 3.26*  < > = values in this interval not displayed.  Estimated Creatinine Clearance: 15.3 mL/min (by C-G formula based on SCr of 3.26 mg/dL (H)).    Allergies  Allergen Reactions  . Zosyn [Piperacillin Sod-Tazobactam So] Rash    Patient reports she has tolerated cephalexin in the past.  . Amlodipine   . Clindamycin/Lincomycin     1/10 zyvox>> 1/14 1/10 meropenem>>1/11 1/14 Ancef >>  1/10 Resp panel: Rhino/Enterovirus 1/10 BCx: MSSA 1/10 urine: 40K yeast 1/10 resp: Few GPC in pairs, Few yeast (reincubated) 1/10 MRSA PCR neg 1/11 Trach asp: Mod staph aureus, mod C.albicans 1/11 BCx: 2/2 MSSA 1/13 BCx: 1/2  MSSA  Thank you for allowing pharmacy to be a part of this patient's care.  Alycia Rossetti, PharmD, BCPS Clinical Pharmacist Pager: [phone] Clinical phone for 06/03/2016 from 7a-3:30p: [phone] If after 3:30p, please call main pharmacy at: [phone] 06/03/2016 10:22 AM

## 2016-06-03 NOTE — Progress Notes (Signed)
IV team was unable to place a peripheral IV, nephroloy did not feel patient was appropriate for PICC line placement, MD was notified and feels PICC line is necessary.

## 2016-06-04 ENCOUNTER — Encounter (HOSPITAL_COMMUNITY): Payer: Self-pay | Admitting: *Deleted

## 2016-06-04 ENCOUNTER — Inpatient Hospital Stay (HOSPITAL_COMMUNITY): Payer: Medicare Other

## 2016-06-04 DIAGNOSIS — N170 Acute kidney failure with tubular necrosis: Secondary | ICD-10-CM

## 2016-06-04 LAB — BASIC METABOLIC PANEL
Anion gap: 11 (ref 5–15)
BUN: 97 mg/dL — ABNORMAL HIGH (ref 6–20)
CALCIUM: 8.3 mg/dL — AB (ref 8.9–10.3)
CHLORIDE: 107 mmol/L (ref 101–111)
CO2: 22 mmol/L (ref 22–32)
CREATININE: 2.84 mg/dL — AB (ref 0.44–1.00)
GFR calc non Af Amer: 16 mL/min — ABNORMAL LOW (ref >60)
GFR, EST AFRICAN AMERICAN: 18 mL/min — AB (ref >60)
Glucose, Bld: 99 mg/dL (ref 65–99)
Potassium: 4.1 mmol/L (ref 3.5–5.1)
SODIUM: 140 mmol/L (ref 135–145)

## 2016-06-04 LAB — CBC
HCT: 22.3 % — ABNORMAL LOW (ref 36.0–46.0)
Hemoglobin: 7.1 g/dL — ABNORMAL LOW (ref 12.0–15.0)
MCH: 26.7 pg (ref 26.0–34.0)
MCHC: 31.8 g/dL (ref 30.0–36.0)
MCV: 83.8 fL (ref 78.0–100.0)
PLATELETS: 30 10*3/uL — AB (ref 150–400)
RBC: 2.66 MIL/uL — AB (ref 3.87–5.11)
RDW: 15.3 % (ref 11.5–15.5)
WBC: 42.1 10*3/uL — AB (ref 4.0–10.5)

## 2016-06-04 LAB — GLUCOSE, CAPILLARY
GLUCOSE-CAPILLARY: 50 mg/dL — AB (ref 65–99)
GLUCOSE-CAPILLARY: 86 mg/dL (ref 65–99)
GLUCOSE-CAPILLARY: 73 mg/dL (ref 65–99)
GLUCOSE-CAPILLARY: 164 mg/dL — AB (ref 65–99)
GLUCOSE-CAPILLARY: 313 mg/dL — AB (ref 65–99)
GLUCOSE-CAPILLARY: 341 mg/dL — AB (ref 65–99)

## 2016-06-04 LAB — CULTURE, BLOOD (ROUTINE X 2): Culture: NO GROWTH

## 2016-06-04 LAB — IGG: IgG (Immunoglobin G), Serum: 79 mg/dL — ABNORMAL LOW (ref 700–1600)

## 2016-06-04 LAB — PATHOLOGIST SMEAR REVIEW

## 2016-06-04 MED ORDER — CEFTRIAXONE SODIUM 1 G IJ SOLR
1.0000 g | INTRAMUSCULAR | Status: DC
  Filled 2016-06-04: qty 10

## 2016-06-04 MED ORDER — CEFTRIAXONE SODIUM 1 G IJ SOLR
1.0000 g | INTRAMUSCULAR | Status: DC
  Administered 2016-06-04: 1 g via INTRAMUSCULAR
  Filled 2016-06-04 (×2): qty 10

## 2016-06-04 MED ORDER — INSULIN GLARGINE 100 UNIT/ML ~~LOC~~ SOLN
26.0000 [IU] | SUBCUTANEOUS | Status: DC
  Administered 2016-06-04: 26 [IU] via SUBCUTANEOUS
  Filled 2016-06-04 (×2): qty 0.26

## 2016-06-04 NOTE — Progress Notes (Signed)
Patient ID: Tracy Barker, female   DOB: 1945/09/04, 71 y.o.   MRN: [id number] [facility] Progress Note   Assessment/ Plan:   1. AKI on CKD stage 3: Clinically suspected ATN associated with MSSA bacteremia/sepsis. Serologies negative for ANCA vasculitis, antinuclear antibody negative and anti-GBM antibody negative. Renal recovery occurring.  Do not recommend PICCs since has underlying CKD .  Robust urine output off diuretics. We will sign off. Her nephrologist up in [place], [place] is [name].  2. Staphylococcus bacteremia with sepsis: MSSA bacteremia with cavitary pneumonia for which he is on coverage with linezolid-source suspected to be probably from her lower extremity ulcers/osteomyelitis and without any evidence of endocarditis on TEE. Evaluated yesterday by orthopedic surgery and their plans noted. 3. Mixed anion-gap and non-anion gap acidosis: From acute renal failure/metformin and recent GI losses. Acidosis corrected  4. Hyperkalemia:  acute hyperkalemia corrected with CRRT, now resolved. 5. Abdominal pain:  no clear evidence of mesenteric ischemia, CT showed intra-abdominal adenopathy/dilated gallbladder.  Subjective:    Creatinine downtrending.  TDC removed.     Objective:   BP (!) 111/48 (BP Location: Left Arm)   Pulse 67   Temp 97.7 F (36.5 C) (Oral)   Resp 18   Ht 5\' 4"  (1.626 m)   Wt 73.5 kg (162 lb)   SpO2 92%   BMI 27.81 kg/m   Intake/Output Summary (Last 24 hours) at 06/04/16 1251 Last data filed at 06/04/16 0600  Gross per 24 hour  Intake              360 ml  Output              975 ml  Net             -615 ml   Weight change: -2.722 kg (-6 lb)  Physical Exam: [id number] resting in bed-awake and alert CVS: Pulse regular rhythm, normal rate, normal S1 and S2 Resp: Clear to auscultation, no rales or rhonchi Abd: Soft, obese, mildly tender right upper quadrant Ext: No lower extremity edema, dusky appearing left toes with scab covered  dorsal ulcers   Imaging: No results found.  Labs: BMET  Recent Labs Lab 05/28/16 1531  05/29/16 0320 05/29/16 1600 05/30/16 0500 05/30/16 1741 05/31/16 0315 06/01/16 0300 06/02/16 0410 06/03/16 0621 06/03/16 0826 06/04/16 0641  NA 138  < > 139 137 140 133* 134* 135 134* 137 138 140  K 4.6  < > 4.6 5.0 4.9 5.4* 4.9 4.7 4.8 4.0 3.6 4.1  CL 97*  < > 100* 101 104 100* 102 102 103 105 104 107  CO2 24  < > 26 24 23 22 22 22  20* 20* 21* 22  GLUCOSE 239*  < > 210* 220* 181* 458* 168* 140* 108* 124* 115* 99  BUN 55*  < > 36* 30* 27* 38* 47* 69* 86* 95* 91* 97*  CREATININE 2.60*  < > 1.94* 1.64* 1.45* 2.07* 2.44* 3.44* 3.57* 3.37* 3.26* 2.84*  CALCIUM 6.9*  < > 7.2* 7.4* 7.5* 7.3* 7.8* 8.2* 8.1* 7.9* 8.3* 8.3*  PHOS 5.6*  --  4.2 4.3 4.1 5.4* 4.8*  --   --   --   --   --   < > = values in this interval not displayed. CBC  Recent Labs Lab 05/30/16 0934 05/31/16 0315 06/01/16 0300 06/02/16 0410 06/03/16 0826 06/03/16 1036 06/04/16 0641  WBC 47.5* 48.8* 28.6* 40.6* 52.1* 48.8* 42.1*  NEUTROABS 30.4* 19.5* 9.2*  --   --  12.2*  --   HGB 11.2* 9.0* 7.6* 8.0* 8.7* 8.2* 7.1*  HCT 33.8* 27.1* 23.0* 24.3* 26.9* 25.1* 22.3*  MCV 82.8 81.9 82.4 83.2 83.3 83.4 83.8  PLT 94* 72* 40* 35* 29* 30* 30*   Medications:    .  stroke: mapping our early stages of recovery book   Does not apply Once  . sodium chloride   Intravenous Once  . cefTRIAXone (ROCEPHIN) IM  1 g Intramuscular Q24H   And  . cefTRIAXone (ROCEPHIN) IM  1 g Intramuscular Q24H  . feeding supplement (NEPRO CARB STEADY)  237 mL Oral BID BM  . insulin aspart  0-20 Units Subcutaneous Q4H  . insulin glargine  26 Units Subcutaneous Q24H  . mouth rinse  15 mL Mouth Rinse BID   Madelon Lips, MD 06/04/2016, 12:51 PM

## 2016-06-04 NOTE — Progress Notes (Signed)
Removed rectal tube per MD order. Patient tolerated with no complaints.

## 2016-06-04 NOTE — Progress Notes (Addendum)
PROGRESS NOTE    Tracy Barker   [id number]  DOB: [birth year]  DOA: 05/27/2016 PCP: No primary care provider on file.   Brief Narrative:  71 year old female with PMH significant for DM, HTN, and CLL.   She was recently been seen there for cough x 1 month treated with Zpak, and fall with no apparent injury. 1/9 she presented to [facility] for weakness x about 24 hours and several episodes of vomiting.  EMS was called and upon their arrival she was alert and oriented. In ED she was noted to additionally have some L sided weakness. Laboratory evaluation significant for hyperkalemia, renal failure, and leukocytosis. She was given broad spectrum antibiotics (mero and linezolid) and temporizing therapies for hyperkalemia were used.  Despite these therapies she became progressively more lethargic and developed shock. She was started on levophed with escalating doses and eventually was started on epinephrine as well.  She was transferred on 1/10 to [facility] for ICU admission. She was intubated for acute hypoxemic respiratory failure on arrival to [facility] and received CVVHD for AKI.   Subjective: No complaints. Per RN, she had a mild nose bleed yesterday.   Assessment & Plan:  MSSA bacteremia / septic shock  - extubated, off pressors - Negative TEE -Repeat blood culture 1/13  remain positive, ? Source from foot? Left foot x ray with oeteomyelitis - continue abx per ID- currently does not have IV access - ID following  No IV Access - have consulted PCCM to place central line  Cavitary lung masses/ acute respiratory failure  -Likely septic emboli due to bacteremia vs aspiration pneumonia with cavitations  -no longer hypoxic  Hypotension with h/o HTN - holding Catapres, Diltiazem, HCTZ, Ramipril -  stress dose steroids have been tapered off  CLL - chronically elevated WBC counts   Thrombocytopenia - likely from infection  -  HIT panel negative -   h/o CLL,  CT abd/pelvis on presentation did not reveal liver or spleen abnormalities - platelets dropped to 30 - checked for DIC with INR/ PT/ PTT, Fibronogen, d dimer- no evidence of DIC - cont to follow closely- will need to transfuse Platelets if she begins to bleed  Anemia - of acute inflammation/infection? - follow - check anemia panel- will transfuse if Hb  persistently < 8  Left Wounds/ necrosis - ortho plans for "amputation of her 2nd toe with debridement of her other toe wounds vs amputation of the other toes " by the end of this week  AKI/ CKD III - ATN She required CRRT while in ICU Nephrology following  PAF - she required amiodarone drip in ICU  - currently NSR - diltiazem on hold due to hypotension - ECHO mentioned below - check TSH - She has significant thrombocytopenia, not a candidate for anticoagulation.   Diabetes - poorly controlled, previously non-insulin dependent - A1c 10.3  - Will need to discharge on insulin- start insulin teaching  Morbid obesity Body mass index is 33.27 kg/m.   DVT prophylaxis: SCDs Code Status: Full code Family Communication:  Disposition Plan: to be determined Consultants:   Ortho  ID  PCCM  Nephrology  Procedures:  Intubation on 1/10>>1/12 R fem CVL 1/10 >>> Aline placement and removal TEE on 1/12 CVVHD  STUDIES: CT head 1/9> non-acute CT abdomen pelvis 1/9 > non-acute, gallbladder distention, colonic wall thickening CT chest 1/9 >bilateral opacities and cavitary lung masses  US Renal 1/11 >no evidence of hydronephrosis; 3 cm left adrenal  mass  DG Left Foot >>osteomyelitis and septic arthritis  2 D ECHO >> Left ventricle: The cavity size was normal. Systolic function was   vigorous. The estimated ejection fraction was in the range of 65%   to 70%. Wall motion was normal; there were no regional wall   motion abnormalities. Doppler parameters are consistent with   abnormal left ventricular relaxation (grade 1  diastolic   dysfunction). - Ventricular septum: The contour showed diastolic flattening. - Mitral valve: Calcified annulus. - Right ventricle: The cavity size was severely dilated. Wall   thickness was normal. Systolic function was mildly reduced. - Pulmonary arteries: Systolic pressure was severely increased. PA   peak pressure: 71 mm Hg (S).  Antimicrobials:  Anti-infectives    Start     Dose/Rate Route Frequency Ordered Stop   06/04/16 1115  cefTRIAXone (ROCEPHIN) injection 1 g     1 g Intramuscular Every 24 hours 06/04/16 1106     06/04/16 1115  cefTRIAXone (ROCEPHIN) injection 1 g     1 g Intramuscular Every 24 hours 06/04/16 1106     06/04/16 1100  cefTRIAXone (ROCEPHIN) injection 1 g  Status:  Discontinued     1 g Intramuscular Every 24 hours 06/04/16 0959 06/04/16 1104   06/04/16 1100  cefTRIAXone (ROCEPHIN) injection 1 g  Status:  Discontinued     1 g Intramuscular Every 24 hours 06/04/16 0959 06/04/16 1104   06/03/16 1800  ceFAZolin (ANCEF) injection 1.5 g  Status:  Discontinued     1.5 g Intramuscular Every 24 hours 06/03/16 1507 06/04/16 0955   06/03/16 1100  ceFAZolin (ANCEF) IVPB 1 g/50 mL premix  Status:  Discontinued     1 g 100 mL/hr over 30 Minutes Intravenous Every 12 hours 06/03/16 1015 06/03/16 1506   05/31/16 1400  ceFAZolin (ANCEF) IVPB 1 g/50 mL premix  Status:  Discontinued     1 g 100 mL/hr over 30 Minutes Intravenous Every 24 hours 05/31/16 1306 06/03/16 1015   05/28/16 1830  linezolid (ZYVOX) IVPB 600 mg  Status:  Discontinued     600 mg 300 mL/hr over 60 Minutes Intravenous Every 12 hours 05/28/16 1130 05/31/16 1235   05/28/16 0800  meropenem (MERREM) 500 mg in sodium chloride 0.9 % 50 mL IVPB  Status:  Discontinued     500 mg 100 mL/hr over 30 Minutes Intravenous Every 24 hours 05/27/16 0743 05/27/16 1645   05/27/16 2000  meropenem (MERREM) 1 g in sodium chloride 0.9 % 100 mL IVPB  Status:  Discontinued     1 g 200 mL/hr over 30 Minutes Intravenous  Every 12 hours 05/27/16 1645 05/28/16 1130   05/27/16 0730  meropenem (MERREM) 1 g in sodium chloride 0.9 % 100 mL IVPB     1 g 200 mL/hr over 30 Minutes Intravenous  Once 05/27/16 0632 05/27/16 0808   05/27/16 0730  linezolid (ZYVOX) IVPB 600 mg  Status:  Discontinued     600 mg 300 mL/hr over 60 Minutes Intravenous Every 12 hours 05/27/16 0632 05/28/16 1056       Objective: Vitals:   06/03/16 2032 06/03/16 2037 06/04/16 0434 06/04/16 1037  BP:  (!) 90/50 (!) 95/39 (!) 111/48  Pulse:  (!) 101 65 67  Resp:  17 16 18   Temp:  97.9 F (36.6 C) 97.3 F (36.3 C) 97.7 F (36.5 C)  TempSrc:  Oral Oral Oral  SpO2:  92% 93% 92%  Weight: 73.5 kg (162 lb)  Height: 5\' 4"  (1.626 m)       Intake/Output Summary (Last 24 hours) at 06/04/16 1222 Last data filed at 06/04/16 0600  Gross per 24 hour  Intake              360 ml  Output              975 ml  Net             -615 ml   Filed Weights   06/01/16 2257 06/03/16 0437 06/03/16 2032  Weight: 82.5 kg (181 lb 14.1 oz) 76.2 kg (168 lb) 73.5 kg (162 lb)    Examination: General exam: Appears comfortable  HEENT: PERRLA, oral mucosa moist, no sclera icterus or thrush Respiratory system: Clear to auscultation. Respiratory effort normal. Cardiovascular system: S1 & S2 heard, RRR.  No murmurs  Gastrointestinal system: Abdomen soft, non-tender, nondistended. Normal bowel sound. No organomegaly Central nervous system: Alert and oriented. No focal neurological deficits. Extremities: No cyanosis, clubbing or edema Skin: necrosis and ulcers on multiple left toes with swelling of the foot Psychiatry:  Mood & affect appropriate.     Data Reviewed: I have personally reviewed following labs and imaging studies  CBC:  Recent Labs Lab 05/30/16 0934 05/31/16 0315 06/01/16 0300 06/02/16 0410 06/03/16 0826 06/03/16 1036 06/04/16 0641  WBC 47.5* 48.8* 28.6* 40.6* 52.1* 48.8* 42.1*  NEUTROABS 30.4* 19.5* 9.2*  --   --  12.2*  --   HGB  11.2* 9.0* 7.6* 8.0* 8.7* 8.2* 7.1*  HCT 33.8* 27.1* 23.0* 24.3* 26.9* 25.1* 22.3*  MCV 82.8 81.9 82.4 83.2 83.3 83.4 83.8  PLT 94* 72* 40* 35* 29* 30* 30*   Basic Metabolic Panel:  Recent Labs Lab 05/29/16 0320 05/29/16 1600 05/30/16 0500 05/30/16 1741 05/31/16 0315 06/01/16 0300 06/02/16 0410 06/03/16 0621 06/03/16 0826 06/04/16 0641  NA 139 137 140 133* 134* 135 134* 137 138 140  K 4.6 5.0 4.9 5.4* 4.9 4.7 4.8 4.0 3.6 4.1  CL 100* 101 104 100* 102 102 103 105 104 107  CO2 26 24 23 22 22 22  20* 20* 21* 22  GLUCOSE 210* 220* 181* 458* 168* 140* 108* 124* 115* 99  BUN 36* 30* 27* 38* 47* 69* 86* 95* 91* 97*  CREATININE 1.94* 1.64* 1.45* 2.07* 2.44* 3.44* 3.57* 3.37* 3.26* 2.84*  CALCIUM 7.2* 7.4* 7.5* 7.3* 7.8* 8.2* 8.1* 7.9* 8.3* 8.3*  MG 2.4  --  2.5*  --  2.4  --   --   --   --   --   PHOS 4.2 4.3 4.1 5.4* 4.8*  --   --   --   --   --    GFR: Estimated Creatinine Clearance: 18.1 mL/min (by C-G formula based on SCr of 2.84 mg/dL (H)). Liver Function Tests:  Recent Labs Lab 05/29/16 1600 05/30/16 0500 05/30/16 1741 05/31/16 0315 06/03/16 0621  AST  --   --   --   --  27  ALT  --   --   --   --  18  ALKPHOS  --   --   --   --  315*  BILITOT  --   --   --   --  0.9  PROT  --   --   --   --  3.6*  ALBUMIN 1.7* 1.8* 1.6* 1.8* 1.7*   No results for input(s): LIPASE, AMYLASE in the last 168 hours. No results for input(s): AMMONIA in the last 168  hours. Coagulation Profile:  Recent Labs Lab 06/03/16 1036  INR 1.07   Cardiac Enzymes:  Recent Labs Lab 05/28/16 1415 05/28/16 2049 05/29/16 0320  CKTOTAL  --   --  412*  TROPONINI 0.32* 0.22*  --    BNP (last 3 results) No results for input(s): PROBNP in the last 8760 hours. HbA1C: No results for input(s): HGBA1C in the last 72 hours. CBG:  Recent Labs Lab 06/03/16 2031 06/03/16 2358 06/04/16 0422 06/04/16 0458 06/04/16 0817  GLUCAP 254* 129* 50* 86 73   Lipid Profile: No results for input(s):  CHOL, HDL, LDLCALC, TRIG, CHOLHDL, LDLDIRECT in the last 72 hours. Thyroid Function Tests: No results for input(s): TSH, T4TOTAL, FREET4, T3FREE, THYROIDAB in the last 72 hours. Anemia Panel: No results for input(s): VITAMINB12, FOLATE, FERRITIN, TIBC, IRON, RETICCTPCT in the last 72 hours. Urine analysis:    Component Value Date/Time   COLORURINE AMBER (A) 05/27/2016 0804   APPEARANCEUR CLOUDY (A) 05/27/2016 0804   LABSPEC 1.023 05/27/2016 0804   PHURINE 5.0 05/27/2016 0804   GLUCOSEU 150 (A) 05/27/2016 0804   HGBUR SMALL (A) 05/27/2016 0804   BILIRUBINUR NEGATIVE 05/27/2016 0804   KETONESUR NEGATIVE 05/27/2016 0804   PROTEINUR 100 (A) 05/27/2016 0804   NITRITE NEGATIVE 05/27/2016 0804   LEUKOCYTESUR NEGATIVE 05/27/2016 0804   Sepsis Labs: @LABRCNTIP (procalcitonin:4,lacticidven:4) ) Recent Results (from the past 240 hour(s))  MRSA PCR Screening     Status: None   Collection Time: 05/27/16  5:27 AM  Result Value Ref Range Status   MRSA by PCR NEGATIVE NEGATIVE Final    Comment:        The GeneXpert MRSA Assay (FDA approved for NASAL specimens only), is one component of a comprehensive MRSA colonization surveillance program. It is not intended to diagnose MRSA infection nor to guide or monitor treatment for MRSA infections.   Culture, Urine     Status: Abnormal   Collection Time: 05/27/16  8:04 AM  Result Value Ref Range Status   Specimen Description URINE, CATHETERIZED  Final   Special Requests NONE  Final   Culture 40,000 COLONIES/mL YEAST (A)  Final   Report Status 05/28/2016 FINAL  Final  Respiratory Panel by PCR     Status: Abnormal   Collection Time: 05/27/16  8:04 AM  Result Value Ref Range Status   Adenovirus NOT DETECTED NOT DETECTED Final   Coronavirus 229E NOT DETECTED NOT DETECTED Final   Coronavirus HKU1 NOT DETECTED NOT DETECTED Final   Coronavirus NL63 NOT DETECTED NOT DETECTED Final   Coronavirus OC43 NOT DETECTED NOT DETECTED Final    Metapneumovirus NOT DETECTED NOT DETECTED Final   Rhinovirus / Enterovirus DETECTED (A) NOT DETECTED Final   Influenza A NOT DETECTED NOT DETECTED Final   Influenza B NOT DETECTED NOT DETECTED Final   Parainfluenza Virus 1 NOT DETECTED NOT DETECTED Final   Parainfluenza Virus 2 NOT DETECTED NOT DETECTED Final   Parainfluenza Virus 3 NOT DETECTED NOT DETECTED Final   Parainfluenza Virus 4 NOT DETECTED NOT DETECTED Final   Respiratory Syncytial Virus NOT DETECTED NOT DETECTED Final   Bordetella pertussis NOT DETECTED NOT DETECTED Final   Chlamydophila pneumoniae NOT DETECTED NOT DETECTED Final   Mycoplasma pneumoniae NOT DETECTED NOT DETECTED Final  Culture, blood (Routine X 2) w Reflex to ID Panel     Status: Abnormal   Collection Time: 05/27/16  9:05 AM  Result Value Ref Range Status   Specimen Description BLOOD RIGHT HAND  Final   Special Requests  IN PEDIATRIC BOTTLE 1CC  Final   Culture  Setup Time   Final    GRAM POSITIVE COCCI IN CLUSTERS IN PEDIATRIC BOTTLE CRITICAL RESULT CALLED TO, READ BACK BY AND VERIFIED WITH: CARON AMEND,PHARMD @0252  05/28/16 MKELLY,MLT    Culture STAPHYLOCOCCUS AUREUS (A)  Final   Report Status 05/30/2016 FINAL  Final   Organism ID, Bacteria STAPHYLOCOCCUS AUREUS  Final      Susceptibility   Staphylococcus aureus - MIC*    CIPROFLOXACIN <=0.5 SENSITIVE Sensitive     ERYTHROMYCIN >=8 RESISTANT Resistant     GENTAMICIN <=0.5 SENSITIVE Sensitive     OXACILLIN 0.5 SENSITIVE Sensitive     TETRACYCLINE <=1 SENSITIVE Sensitive     VANCOMYCIN <=0.5 SENSITIVE Sensitive     TRIMETH/SULFA <=10 SENSITIVE Sensitive     CLINDAMYCIN <=0.25 SENSITIVE Sensitive     RIFAMPIN <=0.5 SENSITIVE Sensitive     Inducible Clindamycin NEGATIVE Sensitive     * STAPHYLOCOCCUS AUREUS  Blood Culture ID Panel (Reflexed)     Status: Abnormal   Collection Time: 05/27/16  9:05 AM  Result Value Ref Range Status   Enterococcus species NOT DETECTED NOT DETECTED Final   Vancomycin  resistance NOT DETECTED NOT DETECTED Final   Listeria monocytogenes NOT DETECTED NOT DETECTED Final   Staphylococcus species DETECTED (A) NOT DETECTED Final    Comment: CRITICAL RESULT CALLED TO, READ BACK BY AND VERIFIED WITH: CARON AMEND, PHARMD @0252  05/28/16 MKELLY,MLT    Staphylococcus aureus DETECTED (A) NOT DETECTED Final    Comment: CRITICAL RESULT CALLED TO, READ BACK BY AND VERIFIED WITH: CARON AMEND,PHARMD @0252  05/28/16 MKELLY,MLT    Methicillin resistance NOT DETECTED NOT DETECTED Final   Streptococcus species NOT DETECTED NOT DETECTED Final   Streptococcus agalactiae NOT DETECTED NOT DETECTED Final   Streptococcus pneumoniae NOT DETECTED NOT DETECTED Final   Streptococcus pyogenes NOT DETECTED NOT DETECTED Final   Acinetobacter baumannii NOT DETECTED NOT DETECTED Final   Enterobacteriaceae species NOT DETECTED NOT DETECTED Final   Enterobacter cloacae complex NOT DETECTED NOT DETECTED Final   Escherichia coli NOT DETECTED NOT DETECTED Final   Klebsiella oxytoca NOT DETECTED NOT DETECTED Final   Klebsiella pneumoniae NOT DETECTED NOT DETECTED Final   Proteus species NOT DETECTED NOT DETECTED Final   Serratia marcescens NOT DETECTED NOT DETECTED Final   Carbapenem resistance NOT DETECTED NOT DETECTED Final   Haemophilus influenzae NOT DETECTED NOT DETECTED Final   Neisseria meningitidis NOT DETECTED NOT DETECTED Final   Pseudomonas aeruginosa NOT DETECTED NOT DETECTED Final   Candida albicans NOT DETECTED NOT DETECTED Final   Candida glabrata NOT DETECTED NOT DETECTED Final   Candida krusei NOT DETECTED NOT DETECTED Final   Candida parapsilosis NOT DETECTED NOT DETECTED Final   Candida tropicalis NOT DETECTED NOT DETECTED Final  Culture, blood (Routine X 2) w Reflex to ID Panel     Status: Abnormal   Collection Time: 05/27/16  9:12 AM  Result Value Ref Range Status   Specimen Description BLOOD CENTRAL LINE  Final   Special Requests IN PEDIATRIC BOTTLE 1CC  Final    Culture  Setup Time   Final    GRAM POSITIVE COCCI IN CLUSTERS IN PEDIATRIC BOTTLE CRITICAL VALUE NOTED.  VALUE IS CONSISTENT WITH PREVIOUSLY REPORTED AND CALLED VALUE.    Culture (A)  Final    STAPHYLOCOCCUS AUREUS SUSCEPTIBILITIES PERFORMED ON PREVIOUS CULTURE WITHIN THE LAST 5 DAYS.    Report Status 05/30/2016 FINAL  Final  Culture, blood (routine x 2)  Status: Abnormal   Collection Time: 05/28/16 12:00 PM  Result Value Ref Range Status   Specimen Description BLOOD RIGHT HAND  Final   Special Requests IN PEDIATRIC BOTTLE  .[place]  Final   Culture  Setup Time   Final    GRAM POSITIVE COCCI IN CLUSTERS IN PEDIATRIC BOTTLE CRITICAL VALUE NOTED.  VALUE IS CONSISTENT WITH PREVIOUSLY REPORTED AND CALLED VALUE. CRITICAL RESULT CALLED TO, READ BACK BY AND VERIFIED WITH: CARON AMEND,PHARMD @0710  05/29/16 MKELLY,MLT    Culture (A)  Final    STAPHYLOCOCCUS AUREUS SUSCEPTIBILITIES PERFORMED ON PREVIOUS CULTURE WITHIN THE LAST 5 DAYS.    Report Status 05/30/2016 FINAL  Final  Culture, respiratory (NON-Expectorated)     Status: None   Collection Time: 05/28/16 12:58 PM  Result Value Ref Range Status   Specimen Description TRACHEAL ASPIRATE  Final   Special Requests Normal  Final   Gram Stain   Final    ABUNDANT WBC PRESENT,BOTH PMN AND MONONUCLEAR FEW GRAM POSITIVE COCCI IN PAIRS FEW YEAST    Culture   Final    MODERATE STAPHYLOCOCCUS AUREUS MODERATE CANDIDA ALBICANS    Report Status 06/01/2016 FINAL  Final   Organism ID, Bacteria STAPHYLOCOCCUS AUREUS  Final      Susceptibility   Staphylococcus aureus - MIC*    CIPROFLOXACIN <=0.5 SENSITIVE Sensitive     ERYTHROMYCIN >=8 RESISTANT Resistant     GENTAMICIN <=0.5 SENSITIVE Sensitive     OXACILLIN 0.5 SENSITIVE Sensitive     TETRACYCLINE <=1 SENSITIVE Sensitive     VANCOMYCIN <=0.5 SENSITIVE Sensitive     TRIMETH/SULFA <=10 SENSITIVE Sensitive     CLINDAMYCIN <=0.25 SENSITIVE Sensitive     RIFAMPIN <=0.5 SENSITIVE Sensitive       Inducible Clindamycin NEGATIVE Sensitive     * MODERATE STAPHYLOCOCCUS AUREUS  Culture, blood (Routine X 2) w Reflex to ID Panel     Status: Abnormal   Collection Time: 05/30/16  9:33 AM  Result Value Ref Range Status   Specimen Description BLOOD RIGHT ANTECUBITAL  Final   Special Requests BOTTLES DRAWN AEROBIC AND ANAEROBIC 5CC EA  Final   Culture  Setup Time   Final    IN BOTH AEROBIC AND ANAEROBIC BOTTLES GRAM POSITIVE COCCI IN CLUSTERS CRITICAL RESULT CALLED TO, READ BACK BY AND VERIFIED WITH: TO KHAMMONS(PHRMD)BY TCLEVELAND 06/02/2016 AT 5:09AM    Culture (A)  Final    STAPHYLOCOCCUS AUREUS SUSCEPTIBILITIES PERFORMED ON PREVIOUS CULTURE WITHIN THE LAST 5 DAYS.    Report Status 06/04/2016 FINAL  Final  Blood Culture ID Panel (Reflexed)     Status: Abnormal   Collection Time: 05/30/16  9:33 AM  Result Value Ref Range Status   Enterococcus species NOT DETECTED NOT DETECTED Final   Listeria monocytogenes NOT DETECTED NOT DETECTED Final   Staphylococcus species DETECTED (A) NOT DETECTED Final    Comment: CRITICAL RESULT CALLED TO, READ BACK BY AND VERIFIED WITH: TO KHAMMONS(PHARMD) BY TCLEVELAND 06/04/2016 AT 5:04AM CORRECT CALL DATE 06/02/2016    Staphylococcus aureus DETECTED (A) NOT DETECTED Final    Comment: CRITICAL RESULT CALLED TO, READ BACK BY AND VERIFIED WITH: TO KHAMMONS(PHARMD) BY TCLEVELAND 06/04/2016 AT 5:04AM CORRECT CALL DATE 06/02/2016    Methicillin resistance NOT DETECTED NOT DETECTED Final   Streptococcus species NOT DETECTED NOT DETECTED Final   Streptococcus agalactiae NOT DETECTED NOT DETECTED Final   Streptococcus pneumoniae NOT DETECTED NOT DETECTED Final   Streptococcus pyogenes NOT DETECTED NOT DETECTED Final   Acinetobacter baumannii NOT DETECTED NOT  DETECTED Final   Enterobacteriaceae species NOT DETECTED NOT DETECTED Final   Enterobacter cloacae complex NOT DETECTED NOT DETECTED Final   Escherichia coli NOT DETECTED NOT DETECTED Final   Klebsiella  oxytoca NOT DETECTED NOT DETECTED Final   Klebsiella pneumoniae NOT DETECTED NOT DETECTED Final   Proteus species NOT DETECTED NOT DETECTED Final   Serratia marcescens NOT DETECTED NOT DETECTED Final   Haemophilus influenzae NOT DETECTED NOT DETECTED Final   Neisseria meningitidis NOT DETECTED NOT DETECTED Final   Pseudomonas aeruginosa NOT DETECTED NOT DETECTED Final   Candida albicans NOT DETECTED NOT DETECTED Final   Candida glabrata NOT DETECTED NOT DETECTED Final   Candida krusei NOT DETECTED NOT DETECTED Final   Candida parapsilosis NOT DETECTED NOT DETECTED Final   Candida tropicalis NOT DETECTED NOT DETECTED Final  Culture, blood (Routine X 2) w Reflex to ID Panel     Status: None (Preliminary result)   Collection Time: 05/30/16  9:41 AM  Result Value Ref Range Status   Specimen Description BLOOD LEFT ANTECUBITAL  Final   Special Requests BOTTLES DRAWN AEROBIC AND ANAEROBIC 8CC EA  Final   Culture NO GROWTH 4 DAYS  Final   Report Status PENDING  Incomplete  Radiology Studies: No results found.    Scheduled Meds: .  stroke: mapping our early stages of recovery book   Does not apply Once  . sodium chloride   Intravenous Once  . cefTRIAXone (ROCEPHIN) IM  1 g Intramuscular Q24H   And  . cefTRIAXone (ROCEPHIN) IM  1 g Intramuscular Q24H  . feeding supplement (NEPRO CARB STEADY)  237 mL Oral BID BM  . insulin aspart  0-20 Units Subcutaneous Q4H  . insulin glargine  30 Units Subcutaneous Q24H  . mouth rinse  15 mL Mouth Rinse BID   Continuous Infusions:   LOS: 8 days    Time spent in minutes: 28    Aff[place], MD [facility] Pager: [email or web address] Password [facility] 06/04/2016, 12:22 PM

## 2016-06-04 NOTE — Progress Notes (Signed)
  Pharmacy Antibiotic Note  Tracy Barker is a 71 y.o. female admitted on 05/27/2016 with MSSA bacteremia and cavitary PNA.  Pharmacy has been consulted for cefazolin dosing. Patient has zosyn allergy listed and after further discussion with the patient she reports that she got a rash with zosyn. She does state that she has tolerated cephalexin, a first-generation cephalosporin in the past.   The patient was noted to lose IV access on 1/17 and Cefazolin was changed to IM however this was not given by the RN and it was discovered on 1/18 to be too large of a volume for IM administration. Discussed with Dr. Linus Salmons and Dr. Wynelle Cleveland today and will give a total of Rocephin 2g IM in divided doses at alternative injection sites. MD is attempting to get PIV established today. Once this is done - will be able to transition back to Cefazolin.   Plan: 1. Give Rocephin 1g IM x 2 doses in alternative large muscle injection sites for a total daily dose of 2g every 24 hours 2. Will f/u progress of establishing IV access and plans to transition back to Cefazolin 3. Will monitor improvement in renal function, new culture results, ortho plans  Height: 5\' 4"  (162.6 cm) Weight: 162 lb (73.5 kg) IBW/kg (Calculated) : 54.7  Temp (24hrs), Avg:97.7 F (36.5 C), Min:97.3 F (36.3 C), Max:97.9 F (36.6 C)   Recent Labs Lab 06/01/16 0300 06/02/16 0410 06/03/16 0621 06/03/16 0826 06/03/16 1036 06/04/16 0641  WBC 28.6* 40.6*  --  52.1* 48.8* 42.1*  CREATININE 3.44* 3.57* 3.37* 3.26*  --  2.84*    Estimated Creatinine Clearance: 18.1 mL/min (by C-G formula based on SCr of 2.84 mg/dL (H)).    Allergies  Allergen Reactions  . Zosyn [Piperacillin Sod-Tazobactam So] Rash    Patient reports she has tolerated cephalexin in the past.  . Amlodipine   . Clindamycin/Lincomycin     1/10 zyvox>> 1/14 1/10 meropenem>>1/11 1/14 Ancef >> 1/17 1/18 Rocephin (short term until IV access res-established) >>  1/10 Resp  panel: Rhino/Enterovirus 1/10 BCx: MSSA 1/10 urine: 40K yeast 1/10 resp: Few GPC in pairs, Few yeast (reincubated) 1/10 MRSA PCR neg 1/11 Trach asp: Mod staph aureus, mod C.albicans 1/11 BCx: 2/2 MSSA 1/13 BCx: 1/2 MSSA  Thank you for allowing pharmacy to be a part of this patient's care.  Alycia Rossetti, PharmD, BCPS Clinical Pharmacist Pager: [phone] Clinical phone for 06/04/2016 from 7a-3:30p: [phone] If after 3:30p, please call main pharmacy at: [phone] 06/04/2016 10:06 AM

## 2016-06-04 NOTE — Progress Notes (Signed)
[facility] for Infectious Disease   Reason for visit: Follow up on osteomyelitis and MSSA bacteremia  Interval History: Dr. Ninfa Linden plans on surgery later this week with amputation; no fever, no new complaints Total days of antibiotics 9 Cefazolin day 5  Physical Exam: Constitutional:  Vitals:   06/03/16 2037 06/04/16 0434  BP: (!) 90/50 (!) 95/39  Pulse: (!) 101 65  Resp: 17 16  Temp: 97.9 F (36.6 C) 97.3 F (36.3 C)   patient appears in NAD Eyes: anicteric HENT: no thrush Respiratory: Normal respiratory effort; CTA B Cardiovascular: RRR MS: left second toe the same with necrotic end without drainage  Review of Systems: Constitutional: negative for fatigue and malaise Gastrointestinal: negative for diarrhea Integument/breast: negative for rash  Lab Results  Component Value Date   WBC 42.1 (H) 06/04/2016   HGB 7.1 (L) 06/04/2016   HCT 22.3 (L) 06/04/2016   MCV 83.8 06/04/2016   PLT 30 (L) 06/04/2016    Lab Results  Component Value Date   CREATININE 2.84 (H) 06/04/2016   BUN 97 (H) 06/04/2016   NA 140 06/04/2016   K 4.1 06/04/2016   CL 107 06/04/2016   CO2 22 06/04/2016    Lab Results  Component Value Date   ALT 18 06/03/2016   AST 27 06/03/2016   ALKPHOS 315 (H) 06/03/2016     Microbiology: Recent Results (from the past 240 hour(s))  MRSA PCR Screening     Status: None   Collection Time: 05/27/16  5:27 AM  Result Value Ref Range Status   MRSA by PCR NEGATIVE NEGATIVE Final    Comment:        The GeneXpert MRSA Assay (FDA approved for NASAL specimens only), is one component of a comprehensive MRSA colonization surveillance program. It is not intended to diagnose MRSA infection nor to guide or monitor treatment for MRSA infections.   Culture, Urine     Status: Abnormal   Collection Time: 05/27/16  8:04 AM  Result Value Ref Range Status   Specimen Description URINE, CATHETERIZED  Final   Special Requests NONE  Final   Culture 40,000  COLONIES/mL YEAST (A)  Final   Report Status 05/28/2016 FINAL  Final  Respiratory Panel by PCR     Status: Abnormal   Collection Time: 05/27/16  8:04 AM  Result Value Ref Range Status   Adenovirus NOT DETECTED NOT DETECTED Final   Coronavirus 229E NOT DETECTED NOT DETECTED Final   Coronavirus HKU1 NOT DETECTED NOT DETECTED Final   Coronavirus NL63 NOT DETECTED NOT DETECTED Final   Coronavirus OC43 NOT DETECTED NOT DETECTED Final   Metapneumovirus NOT DETECTED NOT DETECTED Final   Rhinovirus / Enterovirus DETECTED (A) NOT DETECTED Final   Influenza A NOT DETECTED NOT DETECTED Final   Influenza B NOT DETECTED NOT DETECTED Final   Parainfluenza Virus 1 NOT DETECTED NOT DETECTED Final   Parainfluenza Virus 2 NOT DETECTED NOT DETECTED Final   Parainfluenza Virus 3 NOT DETECTED NOT DETECTED Final   Parainfluenza Virus 4 NOT DETECTED NOT DETECTED Final   Respiratory Syncytial Virus NOT DETECTED NOT DETECTED Final   Bordetella pertussis NOT DETECTED NOT DETECTED Final   Chlamydophila pneumoniae NOT DETECTED NOT DETECTED Final   Mycoplasma pneumoniae NOT DETECTED NOT DETECTED Final  Culture, blood (Routine X 2) w Reflex to ID Panel     Status: Abnormal   Collection Time: 05/27/16  9:05 AM  Result Value Ref Range Status   Specimen Description BLOOD RIGHT  HAND  Final   Special Requests IN PEDIATRIC BOTTLE 1CC  Final   Culture  Setup Time   Final    GRAM POSITIVE COCCI IN CLUSTERS IN PEDIATRIC BOTTLE CRITICAL RESULT CALLED TO, READ BACK BY AND VERIFIED WITH: CARON AMEND,PHARMD @0252  05/28/16 MKELLY,MLT    Culture STAPHYLOCOCCUS AUREUS (A)  Final   Report Status 05/30/2016 FINAL  Final   Organism ID, Bacteria STAPHYLOCOCCUS AUREUS  Final      Susceptibility   Staphylococcus aureus - MIC*    CIPROFLOXACIN <=0.5 SENSITIVE Sensitive     ERYTHROMYCIN >=8 RESISTANT Resistant     GENTAMICIN <=0.5 SENSITIVE Sensitive     OXACILLIN 0.5 SENSITIVE Sensitive     TETRACYCLINE <=1 SENSITIVE Sensitive      VANCOMYCIN <=0.5 SENSITIVE Sensitive     TRIMETH/SULFA <=10 SENSITIVE Sensitive     CLINDAMYCIN <=0.25 SENSITIVE Sensitive     RIFAMPIN <=0.5 SENSITIVE Sensitive     Inducible Clindamycin NEGATIVE Sensitive     * STAPHYLOCOCCUS AUREUS  Blood Culture ID Panel (Reflexed)     Status: Abnormal   Collection Time: 05/27/16  9:05 AM  Result Value Ref Range Status   Enterococcus species NOT DETECTED NOT DETECTED Final   Vancomycin resistance NOT DETECTED NOT DETECTED Final   Listeria monocytogenes NOT DETECTED NOT DETECTED Final   Staphylococcus species DETECTED (A) NOT DETECTED Final    Comment: CRITICAL RESULT CALLED TO, READ BACK BY AND VERIFIED WITH: CARON AMEND, PHARMD @0252  05/28/16 MKELLY,MLT    Staphylococcus aureus DETECTED (A) NOT DETECTED Final    Comment: CRITICAL RESULT CALLED TO, READ BACK BY AND VERIFIED WITH: CARON AMEND,PHARMD @0252  05/28/16 MKELLY,MLT    Methicillin resistance NOT DETECTED NOT DETECTED Final   Streptococcus species NOT DETECTED NOT DETECTED Final   Streptococcus agalactiae NOT DETECTED NOT DETECTED Final   Streptococcus pneumoniae NOT DETECTED NOT DETECTED Final   Streptococcus pyogenes NOT DETECTED NOT DETECTED Final   Acinetobacter baumannii NOT DETECTED NOT DETECTED Final   Enterobacteriaceae species NOT DETECTED NOT DETECTED Final   Enterobacter cloacae complex NOT DETECTED NOT DETECTED Final   Escherichia coli NOT DETECTED NOT DETECTED Final   Klebsiella oxytoca NOT DETECTED NOT DETECTED Final   Klebsiella pneumoniae NOT DETECTED NOT DETECTED Final   Proteus species NOT DETECTED NOT DETECTED Final   Serratia marcescens NOT DETECTED NOT DETECTED Final   Carbapenem resistance NOT DETECTED NOT DETECTED Final   Haemophilus influenzae NOT DETECTED NOT DETECTED Final   Neisseria meningitidis NOT DETECTED NOT DETECTED Final   Pseudomonas aeruginosa NOT DETECTED NOT DETECTED Final   Candida albicans NOT DETECTED NOT DETECTED Final   Candida glabrata  NOT DETECTED NOT DETECTED Final   Candida krusei NOT DETECTED NOT DETECTED Final   Candida parapsilosis NOT DETECTED NOT DETECTED Final   Candida tropicalis NOT DETECTED NOT DETECTED Final  Culture, blood (Routine X 2) w Reflex to ID Panel     Status: Abnormal   Collection Time: 05/27/16  9:12 AM  Result Value Ref Range Status   Specimen Description BLOOD CENTRAL LINE  Final   Special Requests IN PEDIATRIC BOTTLE 1CC  Final   Culture  Setup Time   Final    GRAM POSITIVE COCCI IN CLUSTERS IN PEDIATRIC BOTTLE CRITICAL VALUE NOTED.  VALUE IS CONSISTENT WITH PREVIOUSLY REPORTED AND CALLED VALUE.    Culture (A)  Final    STAPHYLOCOCCUS AUREUS SUSCEPTIBILITIES PERFORMED ON PREVIOUS CULTURE WITHIN THE LAST 5 DAYS.    Report Status 05/30/2016 FINAL  Final  Culture, blood (routine x 2)     Status: Abnormal   Collection Time: 05/28/16 12:00 PM  Result Value Ref Range Status   Specimen Description BLOOD RIGHT HAND  Final   Special Requests IN PEDIATRIC BOTTLE  .[place]  Final   Culture  Setup Time   Final    GRAM POSITIVE COCCI IN CLUSTERS IN PEDIATRIC BOTTLE CRITICAL VALUE NOTED.  VALUE IS CONSISTENT WITH PREVIOUSLY REPORTED AND CALLED VALUE. CRITICAL RESULT CALLED TO, READ BACK BY AND VERIFIED WITH: CARON AMEND,PHARMD @0710  05/29/16 MKELLY,MLT    Culture (A)  Final    STAPHYLOCOCCUS AUREUS SUSCEPTIBILITIES PERFORMED ON PREVIOUS CULTURE WITHIN THE LAST 5 DAYS.    Report Status 05/30/2016 FINAL  Final  Culture, respiratory (NON-Expectorated)     Status: None   Collection Time: 05/28/16 12:58 PM  Result Value Ref Range Status   Specimen Description TRACHEAL ASPIRATE  Final   Special Requests Normal  Final   Gram Stain   Final    ABUNDANT WBC PRESENT,BOTH PMN AND MONONUCLEAR FEW GRAM POSITIVE COCCI IN PAIRS FEW YEAST    Culture   Final    MODERATE STAPHYLOCOCCUS AUREUS MODERATE CANDIDA ALBICANS    Report Status 06/01/2016 FINAL  Final   Organism ID, Bacteria STAPHYLOCOCCUS AUREUS   Final      Susceptibility   Staphylococcus aureus - MIC*    CIPROFLOXACIN <=0.5 SENSITIVE Sensitive     ERYTHROMYCIN >=8 RESISTANT Resistant     GENTAMICIN <=0.5 SENSITIVE Sensitive     OXACILLIN 0.5 SENSITIVE Sensitive     TETRACYCLINE <=1 SENSITIVE Sensitive     VANCOMYCIN <=0.5 SENSITIVE Sensitive     TRIMETH/SULFA <=10 SENSITIVE Sensitive     CLINDAMYCIN <=0.25 SENSITIVE Sensitive     RIFAMPIN <=0.5 SENSITIVE Sensitive     Inducible Clindamycin NEGATIVE Sensitive     * MODERATE STAPHYLOCOCCUS AUREUS  Culture, blood (Routine X 2) w Reflex to ID Panel     Status: Abnormal   Collection Time: 05/30/16  9:33 AM  Result Value Ref Range Status   Specimen Description BLOOD RIGHT ANTECUBITAL  Final   Special Requests BOTTLES DRAWN AEROBIC AND ANAEROBIC 5CC EA  Final   Culture  Setup Time   Final    IN BOTH AEROBIC AND ANAEROBIC BOTTLES GRAM POSITIVE COCCI IN CLUSTERS CRITICAL RESULT CALLED TO, READ BACK BY AND VERIFIED WITH: TO KHAMMONS(PHRMD)BY TCLEVELAND 06/02/2016 AT 5:09AM    Culture (A)  Final    STAPHYLOCOCCUS AUREUS SUSCEPTIBILITIES PERFORMED ON PREVIOUS CULTURE WITHIN THE LAST 5 DAYS.    Report Status 06/04/2016 FINAL  Final  Blood Culture ID Panel (Reflexed)     Status: Abnormal   Collection Time: 05/30/16  9:33 AM  Result Value Ref Range Status   Enterococcus species NOT DETECTED NOT DETECTED Final   Listeria monocytogenes NOT DETECTED NOT DETECTED Final   Staphylococcus species DETECTED (A) NOT DETECTED Final    Comment: CRITICAL RESULT CALLED TO, READ BACK BY AND VERIFIED WITH: TO KHAMMONS(PHARMD) BY TCLEVELAND 06/04/2016 AT 5:04AM CORRECT CALL DATE 06/02/2016    Staphylococcus aureus DETECTED (A) NOT DETECTED Final    Comment: CRITICAL RESULT CALLED TO, READ BACK BY AND VERIFIED WITH: TO KHAMMONS(PHARMD) BY TCLEVELAND 06/04/2016 AT 5:04AM CORRECT CALL DATE 06/02/2016    Methicillin resistance NOT DETECTED NOT DETECTED Final   Streptococcus species NOT DETECTED NOT  DETECTED Final   Streptococcus agalactiae NOT DETECTED NOT DETECTED Final   Streptococcus pneumoniae NOT DETECTED NOT DETECTED Final   Streptococcus pyogenes NOT DETECTED NOT  DETECTED Final   Acinetobacter baumannii NOT DETECTED NOT DETECTED Final   Enterobacteriaceae species NOT DETECTED NOT DETECTED Final   Enterobacter cloacae complex NOT DETECTED NOT DETECTED Final   Escherichia coli NOT DETECTED NOT DETECTED Final   Klebsiella oxytoca NOT DETECTED NOT DETECTED Final   Klebsiella pneumoniae NOT DETECTED NOT DETECTED Final   Proteus species NOT DETECTED NOT DETECTED Final   Serratia marcescens NOT DETECTED NOT DETECTED Final   Haemophilus influenzae NOT DETECTED NOT DETECTED Final   Neisseria meningitidis NOT DETECTED NOT DETECTED Final   Pseudomonas aeruginosa NOT DETECTED NOT DETECTED Final   Candida albicans NOT DETECTED NOT DETECTED Final   Candida glabrata NOT DETECTED NOT DETECTED Final   Candida krusei NOT DETECTED NOT DETECTED Final   Candida parapsilosis NOT DETECTED NOT DETECTED Final   Candida tropicalis NOT DETECTED NOT DETECTED Final  Culture, blood (Routine X 2) w Reflex to ID Panel     Status: None (Preliminary result)   Collection Time: 05/30/16  9:41 AM  Result Value Ref Range Status   Specimen Description BLOOD LEFT ANTECUBITAL  Final   Special Requests BOTTLES DRAWN AEROBIC AND ANAEROBIC 8CC EA  Final   Culture NO GROWTH 4 DAYS  Final   Report Status PENDING  Incomplete    Impression/Plan:  1. MSSA bacteremia -  On cefazolin.  TEE negative for vegetation but repeat blood culture 1/13 again positive in 1/2.  Repeated yesterday  2. Osteomyelitis of second left toe - Dr. Ninfa Linden to amputate.  If gross margins clear, she will not need prolonged IV therapy with cefazolin and duration of 2 weeks from yesterday (through January 30) for #1 will be adequate as long as blood cultures are not again positive. If she remains on dialysis, can continue cefazolin with  dialysis If no dialysis at discharge, will need access with a central line

## 2016-06-04 NOTE — Progress Notes (Signed)
Inpatient Diabetes Program Recommendations  AACE/ADA: New Consensus Statement on Inpatient Glycemic Control (2015)  Target Ranges:  Prepandial:   less than 140 mg/dL      Peak postprandial:   less than 180 mg/dL (1-2 hours)      Critically ill patients:  140 - 180 mg/dL   Lab Results  Component Value Date   GLUCAP 73 06/04/2016   HGBA1C 10.3 (H) 05/27/2016    Review of Glycemic Control Results for Tracy Barker, Tracy Barker (MRN [id number]) as of 06/04/2016 12:07  Ref. Range 06/03/2016 20:31 06/03/2016 23:58 06/04/2016 04:22 06/04/2016 04:58 06/04/2016 08:17  Glucose-Capillary Latest Ref Range: 65 - 99 mg/dL 254 (H) 129 (H) 50 (L) 86 73   Inpatient Diabetes Program Recommendations:  Noted hypoglycemia post Novolog correction. Please consider decrease in correction to 0-9 units tid + 0-5 units hs. Will follow.  Thank you, Nani Gasser. Hagop Mccollam, RN, MSN, CDE Inpatient Glycemic Control Team Team Pager [phone] (8am-5pm) 06/04/2016 12:08 PM

## 2016-06-04 NOTE — Procedures (Signed)
Central Venous Catheter Insertion Procedure Note Tracy Barker [id number] [birth year]  Procedure: Insertion of Central Venous Catheter Indications: Assessment of intravascular volume, Drug and/or fluid administration and Frequent blood sampling  Procedure Details Consent: Risks of procedure as well as the alternatives and risks of each were explained to the (patient/caregiver).  Consent for procedure obtained. Time Out: Verified patient identification, verified procedure, site/side was marked, verified correct patient position, special equipment/implants available, medications/allergies/relevent history reviewed, required imaging and test results available.  Performed  Maximum sterile technique was used including antiseptics, cap, gloves, gown, hand hygiene, mask and sheet. Skin prep: Chlorhexidine; local anesthetic administered A antimicrobial bonded/coated triple lumen catheter was placed in the rleft internal jugular vein using the Seldinger technique.  Evaluation Blood flow good Complications: No apparent complications Patient did tolerate procedure well. Chest X-ray ordered to verify placement.  CXR: pending.  Tracy Barker 06/04/2016, 3:49 PM  [place]  Tracy Barker J. Titus Mould, [place] Pgr: [facility]  Unable to give plat as no access prior Min blood loss

## 2016-06-05 DIAGNOSIS — Z95828 Presence of other vascular implants and grafts: Secondary | ICD-10-CM

## 2016-06-05 LAB — BASIC METABOLIC PANEL
Anion gap: 10 (ref 5–15)
BUN: 91 mg/dL — AB (ref 6–20)
CALCIUM: 8.4 mg/dL — AB (ref 8.9–10.3)
CO2: 24 mmol/L (ref 22–32)
CREATININE: 2.5 mg/dL — AB (ref 0.44–1.00)
Chloride: 105 mmol/L (ref 101–111)
GFR calc Af Amer: 21 mL/min — ABNORMAL LOW (ref >60)
GFR, EST NON AFRICAN AMERICAN: 18 mL/min — AB (ref >60)
GLUCOSE: 178 mg/dL — AB (ref 65–99)
POTASSIUM: 3.8 mmol/L (ref 3.5–5.1)
SODIUM: 139 mmol/L (ref 135–145)

## 2016-06-05 LAB — CBC
HEMATOCRIT: 21.7 % — AB (ref 36.0–46.0)
Hemoglobin: 6.9 g/dL — CL (ref 12.0–15.0)
MCH: 26.7 pg (ref 26.0–34.0)
MCHC: 31.8 g/dL (ref 30.0–36.0)
MCV: 84.1 fL (ref 78.0–100.0)
PLATELETS: 46 10*3/uL — AB (ref 150–400)
RBC: 2.58 MIL/uL — ABNORMAL LOW (ref 3.87–5.11)
RDW: 15.3 % (ref 11.5–15.5)
WBC: 45.7 10*3/uL — AB (ref 4.0–10.5)

## 2016-06-05 LAB — FOLATE: FOLATE: 7.6 ng/mL (ref >5.9)

## 2016-06-05 LAB — IRON AND TIBC
Iron: 17 ug/dL — ABNORMAL LOW (ref 28–170)
SATURATION RATIOS: 6 % — AB (ref 10.4–31.8)
TIBC: 273 ug/dL (ref 250–450)
UIBC: 256 ug/dL

## 2016-06-05 LAB — GLUCOSE, CAPILLARY
GLUCOSE-CAPILLARY: 148 mg/dL — AB (ref 65–99)
GLUCOSE-CAPILLARY: 152 mg/dL — AB (ref 65–99)
GLUCOSE-CAPILLARY: 217 mg/dL — AB (ref 65–99)
GLUCOSE-CAPILLARY: 267 mg/dL — AB (ref 65–99)
GLUCOSE-CAPILLARY: 330 mg/dL — AB (ref 65–99)
Glucose-Capillary: 236 mg/dL — ABNORMAL HIGH (ref 65–99)
Glucose-Capillary: 172 mg/dL — ABNORMAL HIGH (ref 65–99)

## 2016-06-05 LAB — RETICULOCYTES
RBC.: 2.48 MIL/uL — AB (ref 3.87–5.11)
RETIC COUNT ABSOLUTE: 24.8 10*3/uL (ref 19.0–186.0)
Retic Ct Pct: 1 % (ref 0.4–3.1)

## 2016-06-05 LAB — FERRITIN: Ferritin: 318 ng/mL — ABNORMAL HIGH (ref 11–307)

## 2016-06-05 LAB — HEMOGLOBIN AND HEMATOCRIT, BLOOD
HEMATOCRIT: 23.5 % — AB (ref 36.0–46.0)
Hemoglobin: 7.5 g/dL — ABNORMAL LOW (ref 12.0–15.0)

## 2016-06-05 LAB — PREPARE RBC (CROSSMATCH)

## 2016-06-05 LAB — VITAMIN B12: Vitamin B-12: 2583 pg/mL — ABNORMAL HIGH (ref 180–914)

## 2016-06-05 MED ORDER — SODIUM CHLORIDE 0.9 % IV SOLN
Freq: Once | INTRAVENOUS | Status: AC
  Administered 2016-06-05: 11:00:00 via INTRAVENOUS

## 2016-06-05 MED ORDER — CEFAZOLIN SODIUM-DEXTROSE 2-4 GM/100ML-% IV SOLN
2.0000 g | Freq: Two times a day (BID) | INTRAVENOUS | Status: DC
  Administered 2016-06-05 – 2016-06-07 (×6): 2 g via INTRAVENOUS
  Filled 2016-06-05 (×7): qty 100

## 2016-06-05 MED ORDER — SODIUM CHLORIDE 0.9% FLUSH
10.0000 mL | INTRAVENOUS | Status: DC | PRN
  Administered 2016-06-07: 10 mL
  Administered 2016-06-08: 20 mL
  Administered 2016-06-09: 10 mL
  Filled 2016-06-05 (×3): qty 40

## 2016-06-05 NOTE — Clinical Social Work Placement (Signed)
   CLINICAL SOCIAL WORK PLACEMENT  NOTE  Date:  06/05/2016  Patient Details  Name: Tracy Barker MRN: [id number] Date of Birth: 24-Aug-1945  Clinical Social Work is seeking post-discharge placement for this patient at the [place] level of care (*CSW will initial, date and re-position this form in  chart as items are completed):  No (Patient requesting to dishcarge to a skilled facility near her home in [facility])   Patient/family provided with [place] Work Department's list of facilities offering this level of care within the geographic area requested by the patient (or if unable, by the patient's family).  Yes   Patient/family informed of their freedom to choose among providers that offer the needed level of care, that participate in Medicare, Medicaid or managed care program needed by the patient, have an available bed and are willing to accept the patient.  No   Patient/family informed of [facility]'s ownership interest in [facility] and [facility], as well as of the fact that they are under no obligation to receive care at these facilities.  PASRR submitted to EDS on       PASRR number received on       Existing PASRR number confirmed on 06/02/16     FL2 transmitted to all facilities in geographic area requested by pt/family on  06/05/16     FL2 transmitted to all facilities within larger geographic area on 06/05/16     Patient informed that his/her managed care company has contracts with or will negotiate with certain facilities, including the following:            Patient/family informed of bed offers received.  Patient chooses bed at       Physician recommends and patient chooses bed at      Patient to be transferred to   on  .  Patient to be transferred to facility by       Patient family notified on   of transfer.  Name of family member notified:        PHYSICIAN       Additional Comment:     _______________________________________________ Sable Feil, LCSW 06/05/2016, 3:37 PM

## 2016-06-05 NOTE — Care Management Important Message (Signed)
Important Message  Patient Details  Name: Tracy Barker MRN: [id number] Date of Birth: Oct 08, 1945   Medicare Important Message Given:  Yes    Chevette Fee Montine Circle 06/05/2016, 1:26 PM

## 2016-06-05 NOTE — Progress Notes (Signed)
Physical Therapy Treatment Patient Details Name: Tracy Barker MRN: [id number] DOB: 12-12-1945 Today's Date: 06/05/2016    History of Present Illness This 71 y.o. female transferred from [facility] ED with septic shock  due to staph areus thought to be due to osteomyelitis of Lt second toe .  Pt was intubated 05/27/16 >>05/29/16.  PMH includes:  CLL, DM, HTN, and h/o falls per H&P she was a "frequent flyer" in ED typically for falls      PT Comments    Despite being fatigued initially and significantly fatiguing during treatment, pt participated well with gait and standing activity for over 20 minutes.  Follow Up Recommendations  SNF;Supervision/Assistance - 24 hour     Equipment Recommendations  None recommended by PT    Recommendations for Other Services       Precautions / Restrictions Precautions Precautions: Fall Other Brace/Splint: LLE post op boot    Mobility  Bed Mobility Overal bed mobility: Needs Assistance Bed Mobility: Sit to Supine       Sit to supine: Mod assist   General bed mobility comments: pt so fatigued she needed extra assist to get her legs in and control descent of trunk  Transfers Overall transfer level: Needs assistance Equipment used: Rolling walker (2 wheeled) Transfers: Sit to/from Stand Sit to Stand: Mod assist;+2 physical assistance         General transfer comment: pt required to stand multiple times for pericare.  Began to struggle with fatigue  Ambulation/Gait Ambulation/Gait assistance: Min assist;+2 safety/equipment Ambulation Distance (Feet): 15 Feet Assistive device: Rolling walker (2 wheeled) Gait Pattern/deviations: Step-through pattern Gait velocity: slower Gait velocity interpretation: Below normal speed for age/gender General Gait Details: cues and assist for positioning in the RW and posture and stability assist due to fatigue   Stairs            Wheelchair Mobility    Modified Rankin (Stroke Patients Only)        Balance Overall balance assessment: Needs assistance   Sitting balance-Leahy Scale: Fair     Standing balance support: Bilateral upper extremity supported;During functional activity Standing balance-Leahy Scale: Poor Standing balance comment: stood minutes at a time for pericare due to loose stool.  pt very fatigued after 20+ minutes of activity                    Cognition Arousal/Alertness: Awake/alert Behavior During Therapy: WFL for tasks assessed/performed Overall Cognitive Status: Within Functional Limits for tasks assessed                      Exercises      General Comments        Pertinent Vitals/Pain Pain Assessment: Faces Faces Pain Scale: Hurts little more Pain Location: vague "all over" Pain Descriptors / Indicators: Aching;Sore Pain Intervention(s): Monitored during session;Repositioned    Home Living                      Prior Function            PT Goals (current goals can now be found in the care plan section) Acute Rehab PT Goals Patient Stated Goal: to get home PT Goal Formulation: With patient Time For Goal Achievement: 06/15/16 Potential to Achieve Goals: Good Progress towards PT goals: Progressing toward goals    Frequency    Min 3X/week      PT Plan Current plan remains appropriate    Co-evaluation  End of Session   Activity Tolerance: Patient tolerated treatment well Patient left: in bed;with call bell/phone within reach;with bed alarm set     Time: 1402-1440 PT Time Calculation (min) (ACUTE ONLY): 38 min  Charges:  $Gait Training: 8-22 mins $Therapeutic Activity: 8-22 mins $Self Care/Home Management: 8-22                    G CodesTessie Fass Thi Sisemore 06/05/2016, 3:28 PM 06/05/2016  Donnella Sham, [place] [phone]  (pager)

## 2016-06-05 NOTE — Progress Notes (Signed)
    [facility] for Infectious Disease   Reason for visit: Follow up on osteomyelitis  Interval History: repeat blood cultures again positive; surgery soon for amputation  Physical Exam: Constitutional:  Vitals:   06/05/16 1138 06/05/16 1409  BP: (!) 94/41 (!) 109/51  Pulse: 87 84  Resp: 20 20  Temp: 98 F (36.7 C) 98.2 F (36.8 C)   patient appears in NAD  Impression: Stable bacteremia, now with IJ  Plan: 1.  Continue cefazolin.  Will not repeat blood cultures again until after surgery.

## 2016-06-05 NOTE — Progress Notes (Signed)
Pharmacy Antibiotic Note  Tracy Barker is a 71 y.o. female admitted on 05/27/2016 with MSSA bacteremia and cavitary PNA.  Pharmacy has been consulted for cefazolin dosing. Patient has Zosyn allergy listed and after further discussion with the patient she reports that she got a rash with Zosyn. She does state that she has tolerated cephalexin, a first-generation cephalosporin in the past.   The patient was noted to lose IV access on 1/17 however triple lumen CVC placed on 1/18. Spoke with Dr. Linus Salmons and received order to resume cefazolin. SCr trending down, UOP is good.  Plan: - Discontinue IM ceftriaxone - Cefazolin 2 g IV q12h - Will monitor improvement in renal function, new culture results, ortho plans  Height: 5\' 4"  (162.6 cm) Weight: 162 lb (73.5 kg) IBW/kg (Calculated) : 54.7  Temp (24hrs), Avg:98.2 F (36.8 C), Min:97.7 F (36.5 C), Max:98.4 F (36.9 C)   Recent Labs Lab 06/02/16 0410 06/03/16 0621 06/03/16 0826 06/03/16 1036 06/04/16 0641 06/05/16 0741  WBC 40.6*  --  52.1* 48.8* 42.1* 45.7*  CREATININE 3.57* 3.37* 3.26*  --  2.84* 2.50*    Estimated Creatinine Clearance: 20.6 mL/min (by C-G formula based on SCr of 2.5 mg/dL (H)).    Allergies  Allergen Reactions  . Zosyn [Piperacillin Sod-Tazobactam So] Rash    Patient reports she has tolerated cephalexin in the past.  . Amlodipine   . Clindamycin/Lincomycin     1/10 zyvox>> 1/14 1/10 meropenem>>1/11 1/14 Ancef >> 1/17; 1/19 >>  1/18 Rocephin (short term until IV access res-established) >> 1/19   1/10 Resp panel: Rhino/Enterovirus 1/10 BCx: MSSA 1/10 urine: 40K yeast 1/10 resp: Few GPC in pairs, Few yeast (reincubated) 1/10 MRSA PCR neg 1/11 Trach asp: Mod staph aureus, mod C.albicans 1/11 BCx: 2/2 MSSA 1/13 BCx: 1/2 MSSA 1/17 BCx: 1/2 GPC clusters  Thank you for allowing pharmacy to be a part of this patient's care.  Renold Genta, PharmD, BCPS Clinical Pharmacist Phone for today - [place] - [phone] 06/05/2016 10:18 AM

## 2016-06-05 NOTE — Progress Notes (Signed)
PROGRESS NOTE    Tracy Barker   [id number]  DOB: August 07, 1945  DOA: 05/27/2016 PCP: No primary care provider on file.   Brief Narrative:  71 year old female with PMH significant for DM, HTN, and CLL.   She was recently been seen there for cough x 1 month treated with Zpak, and fall with no apparent injury. 1/9 she presented to [facility] for weakness x about 24 hours and several episodes of vomiting.  EMS was called and upon their arrival she was alert and oriented. In ED she was noted to additionally have some L sided weakness. Laboratory evaluation significant for hyperkalemia, renal failure, and leukocytosis. She was given broad spectrum antibiotics (mero and linezolid) and temporizing therapies for hyperkalemia were used.  Despite these therapies she became progressively more lethargic and developed shock. She was started on levophed with escalating doses and eventually was started on epinephrine as well.  She was transferred on 1/10 to [facility] for ICU admission. She was intubated for acute hypoxemic respiratory failure on arrival to [place] ICU and received CVVHD for AKI.   Subjective: No complaints. Per RN, she had a mild nose bleed yesterday.   Assessment & Plan:  MSSA bacteremia / septic shock  - extubated, off pressors - Negative TEE -Repeat blood culture 1/13  remain positive, ? Source from foot? Left foot x ray with oeteomyelitis - third set of cultures thus far negative - continue abx per ID- left IJ placed by PCCM yesterday - ID following  No IV Access -   PCCM placed central line  Anemia - steady drop in Hb frm 10 to 6.9 today - acute - no blood loss noted therefore likely related to underlying infection - check anemia panel, transfuse 1 U PRBC today  Cavitary lung masses/ acute respiratory failure  -Likely septic emboli due to bacteremia vs aspiration pneumonia with cavitations  -no longer hypoxic   Thrombocytopenia - likely from  infection  -  HIT panel negative -   h/o CLL, CT abd/pelvis on presentation did not reveal liver or spleen abnormalities - platelets dropped to 30 - checked for DIC with INR/ PT/ PTT, Fibronogen, d dimer- no evidence of DIC - cont to follow closely- up to 46 today - will need to transfuse Platelets if she begins to bleed  Hypotension with h/o HTN - holding Catapres, Diltiazem, HCTZ, Ramipril -  stress dose steroids have been tapered off  CLL - chronically elevated WBC counts  Left sided toe wounds/ necrosis - ortho plans for "amputation of her 2nd toe with debridement of her other toe wounds vs amputation of the other toes " by the end of this week  AKI/ CKD III - ATN She required CRRT while in ICU Nephrology following  PAF - she required amiodarone drip in ICU  - currently NSR - diltiazem on hold due to hypotension - ECHO mentioned below - check TSH - She has significant thrombocytopenia, not a candidate for anticoagulation.   Diabetes - poorly controlled, previously non-insulin dependent - A1c 10.3  - Will need to discharge on insulin- started insulin teaching  Morbid obesity Body mass index is 33.27 kg/m.   DVT prophylaxis: SCDs Code Status: Full code Family Communication:  Disposition Plan: to be determined Consultants:   Ortho  ID  PCCM  Nephrology  Procedures:  Intubation on 1/10>>1/12 R fem CVL 1/10 >>> Aline placement and removal TEE on 1/12 CVVHD  STUDIES: CT head 1/9> non-acute CT abdomen pelvis 1/9 >  non-acute, gallbladder distention, colonic wall thickening CT chest 1/9 >bilateral opacities and cavitary lung masses  US Renal 1/11 >no evidence of hydronephrosis; 3 cm left adrenal mass  DG Left Foot >>osteomyelitis and septic arthritis  2 D ECHO >> Left ventricle: The cavity size was normal. Systolic function was   vigorous. The estimated ejection fraction was in the range of 65%   to 70%. Wall motion was normal; there were no  regional wall   motion abnormalities. Doppler parameters are consistent with   abnormal left ventricular relaxation (grade 1 diastolic   dysfunction). - Ventricular septum: The contour showed diastolic flattening. - Mitral valve: Calcified annulus. - Right ventricle: The cavity size was severely dilated. Wall   thickness was normal. Systolic function was mildly reduced. - Pulmonary arteries: Systolic pressure was severely increased. PA   peak pressure: 71 mm Hg (S).  Antimicrobials:  Anti-infectives    Start     Dose/Rate Route Frequency Ordered Stop   06/05/16 1100  ceFAZolin (ANCEF) IVPB 2g/100 mL premix     2 g 200 mL/hr over 30 Minutes Intravenous Every 12 hours 06/05/16 1017     06/04/16 1115  cefTRIAXone (ROCEPHIN) injection 1 g  Status:  Discontinued     1 g Intramuscular Every 24 hours 06/04/16 1106 06/05/16 1013   06/04/16 1115  cefTRIAXone (ROCEPHIN) injection 1 g  Status:  Discontinued     1 g Intramuscular Every 24 hours 06/04/16 1106 06/05/16 1013   06/04/16 1100  cefTRIAXone (ROCEPHIN) injection 1 g  Status:  Discontinued     1 g Intramuscular Every 24 hours 06/04/16 0959 06/04/16 1104   06/04/16 1100  cefTRIAXone (ROCEPHIN) injection 1 g  Status:  Discontinued     1 g Intramuscular Every 24 hours 06/04/16 0959 06/04/16 1104   06/03/16 1800  ceFAZolin (ANCEF) injection 1.5 g  Status:  Discontinued     1.5 g Intramuscular Every 24 hours 06/03/16 1507 06/04/16 0955   06/03/16 1100  ceFAZolin (ANCEF) IVPB 1 g/50 mL premix  Status:  Discontinued     1 g 100 mL/hr over 30 Minutes Intravenous Every 12 hours 06/03/16 1015 06/03/16 1506   05/31/16 1400  ceFAZolin (ANCEF) IVPB 1 g/50 mL premix  Status:  Discontinued     1 g 100 mL/hr over 30 Minutes Intravenous Every 24 hours 05/31/16 1306 06/03/16 1015   05/28/16 1830  linezolid (ZYVOX) IVPB 600 mg  Status:  Discontinued     600 mg 300 mL/hr over 60 Minutes Intravenous Every 12 hours 05/28/16 1130 05/31/16 1235   05/28/16  0800  meropenem (MERREM) 500 mg in sodium chloride 0.9 % 50 mL IVPB  Status:  Discontinued     500 mg 100 mL/hr over 30 Minutes Intravenous Every 24 hours 05/27/16 0743 05/27/16 1645   05/27/16 2000  meropenem (MERREM) 1 g in sodium chloride 0.9 % 100 mL IVPB  Status:  Discontinued     1 g 200 mL/hr over 30 Minutes Intravenous Every 12 hours 05/27/16 1645 05/28/16 1130   05/27/16 0730  meropenem (MERREM) 1 g in sodium chloride 0.9 % 100 mL IVPB     1 g 200 mL/hr over 30 Minutes Intravenous  Once 05/27/16 0632 05/27/16 0808   05/27/16 0730  linezolid (ZYVOX) IVPB 600 mg  Status:  Discontinued     600 mg 300 mL/hr over 60 Minutes Intravenous Every 12 hours 05/27/16 0632 05/28/16 1056       Objective: Vitals:   06/05/16 0504  06/05/16 0927 06/05/16 1109 06/05/16 1138  BP: (!) 107/54 (!) 127/55 (!) 117/52 (!) 94/41  Pulse: 93 92 90 87  Resp: 20 18 20 20   Temp: 98.4 F (36.9 C) 98.4 F (36.9 C) 98.6 F (37 C) 98 F (36.7 C)  TempSrc:  Oral Oral Oral  SpO2: 92% 94% 96% 100%  Weight:      Height:        Intake/Output Summary (Last 24 hours) at 06/05/16 1158 Last data filed at 06/05/16 1014  Gross per 24 hour  Intake              360 ml  Output             1475 ml  Net            -1115 ml   Filed Weights   06/01/16 2257 06/03/16 0437 06/03/16 2032  Weight: 82.5 kg (181 lb 14.1 oz) 76.2 kg (168 lb) 73.5 kg (162 lb)    Examination: General exam: Appears comfortable  HEENT: PERRLA, oral mucosa moist, no sclera icterus or thrush Respiratory system: Clear to auscultation. Respiratory effort normal. Cardiovascular system: S1 & S2 heard, RRR.  No murmurs  Gastrointestinal system: Abdomen soft, non-tender, nondistended. Normal bowel sound. No organomegaly Central nervous system: Alert and oriented. No focal neurological deficits. Extremities: No cyanosis, clubbing or edema Skin: necrosis and ulcers on multiple left toes with swelling of the foot Psychiatry:  Mood & affect  appropriate.     Data Reviewed: I have personally reviewed following labs and imaging studies  CBC:  Recent Labs Lab 05/30/16 0934 05/31/16 0315 06/01/16 0300 06/02/16 0410 06/03/16 0826 06/03/16 1036 06/04/16 0641 06/05/16 0741  WBC 47.5* 48.8* 28.6* 40.6* 52.1* 48.8* 42.1* 45.7*  NEUTROABS 30.4* 19.5* 9.2*  --   --  12.2*  --   --   HGB 11.2* 9.0* 7.6* 8.0* 8.7* 8.2* 7.1* 6.9*  HCT 33.8* 27.1* 23.0* 24.3* 26.9* 25.1* 22.3* 21.7*  MCV 82.8 81.9 82.4 83.2 83.3 83.4 83.8 84.1  PLT 94* 72* 40* 35* 29* 30* 30* 46*   Basic Metabolic Panel:  Recent Labs Lab 05/29/16 1600 05/30/16 0500 05/30/16 1741 05/31/16 0315  06/02/16 0410 06/03/16 0621 06/03/16 0826 06/04/16 0641 06/05/16 0741  NA 137 140 133* 134*  < > 134* 137 138 140 139  K 5.0 4.9 5.4* 4.9  < > 4.8 4.0 3.6 4.1 3.8  CL 101 104 100* 102  < > 103 105 104 107 105  CO2 24 23 22 22   < > 20* 20* 21* 22 24  GLUCOSE 220* 181* 458* 168*  < > 108* 124* 115* 99 178*  BUN 30* 27* 38* 47*  < > 86* 95* 91* 97* 91*  CREATININE 1.64* 1.45* 2.07* 2.44*  < > 3.57* 3.37* 3.26* 2.84* 2.50*  CALCIUM 7.4* 7.5* 7.3* 7.8*  < > 8.1* 7.9* 8.3* 8.3* 8.4*  MG  --  2.5*  --  2.4  --   --   --   --   --   --   PHOS 4.3 4.1 5.4* 4.8*  --   --   --   --   --   --   < > = values in this interval not displayed. GFR: Estimated Creatinine Clearance: 20.6 mL/min (by C-G formula based on SCr of 2.5 mg/dL (H)). Liver Function Tests:  Recent Labs Lab 05/29/16 1600 05/30/16 0500 05/30/16 1741 05/31/16 0315 06/03/16 0621  AST  --   --   --   --  27  ALT  --   --   --   --  18  ALKPHOS  --   --   --   --  315*  BILITOT  --   --   --   --  0.9  PROT  --   --   --   --  3.6*  ALBUMIN 1.7* 1.8* 1.6* 1.8* 1.7*   No results for input(s): LIPASE, AMYLASE in the last 168 hours. No results for input(s): AMMONIA in the last 168 hours. Coagulation Profile:  Recent Labs Lab 06/03/16 1036  INR 1.07   Cardiac Enzymes: No results for input(s):  CKTOTAL, CKMB, CKMBINDEX, TROPONINI in the last 168 hours. BNP (last 3 results) No results for input(s): PROBNP in the last 8760 hours. HbA1C: No results for input(s): HGBA1C in the last 72 hours. CBG:  Recent Labs Lab 06/04/16 2008 06/05/16 0015 06/05/16 0502 06/05/16 0756 06/05/16 1144  GLUCAP 341* 330* 217* 148* 152*   Lipid Profile: No results for input(s): CHOL, HDL, LDLCALC, TRIG, CHOLHDL, LDLDIRECT in the last 72 hours. Thyroid Function Tests: No results for input(s): TSH, T4TOTAL, FREET4, T3FREE, THYROIDAB in the last 72 hours. Anemia Panel:  Recent Labs  06/05/16 1041  FOLATE 7.6  RETICCTPCT 1.0   Urine analysis:    Component Value Date/Time   COLORURINE AMBER (A) 05/27/2016 0804   APPEARANCEUR CLOUDY (A) 05/27/2016 0804   LABSPEC 1.023 05/27/2016 0804   PHURINE 5.0 05/27/2016 0804   GLUCOSEU 150 (A) 05/27/2016 0804   HGBUR SMALL (A) 05/27/2016 0804   BILIRUBINUR NEGATIVE 05/27/2016 0804   KETONESUR NEGATIVE 05/27/2016 0804   PROTEINUR 100 (A) 05/27/2016 0804   NITRITE NEGATIVE 05/27/2016 0804   LEUKOCYTESUR NEGATIVE 05/27/2016 0804   Sepsis Labs: @LABRCNTIP (procalcitonin:4,lacticidven:4) ) Recent Results (from the past 240 hour(s))  MRSA PCR Screening     Status: None   Collection Time: 05/27/16  5:27 AM  Result Value Ref Range Status   MRSA by PCR NEGATIVE NEGATIVE Final    Comment:        The GeneXpert MRSA Assay (FDA approved for NASAL specimens only), is one component of a comprehensive MRSA colonization surveillance program. It is not intended to diagnose MRSA infection nor to guide or monitor treatment for MRSA infections.   Culture, Urine     Status: Abnormal   Collection Time: 05/27/16  8:04 AM  Result Value Ref Range Status   Specimen Description URINE, CATHETERIZED  Final   Special Requests NONE  Final   Culture 40,000 COLONIES/mL YEAST (A)  Final   Report Status 05/28/2016 FINAL  Final  Respiratory Panel by PCR     Status:  Abnormal   Collection Time: 05/27/16  8:04 AM  Result Value Ref Range Status   Adenovirus NOT DETECTED NOT DETECTED Final   Coronavirus 229E NOT DETECTED NOT DETECTED Final   Coronavirus HKU1 NOT DETECTED NOT DETECTED Final   Coronavirus NL63 NOT DETECTED NOT DETECTED Final   Coronavirus OC43 NOT DETECTED NOT DETECTED Final   Metapneumovirus NOT DETECTED NOT DETECTED Final   Rhinovirus / Enterovirus DETECTED (A) NOT DETECTED Final   Influenza A NOT DETECTED NOT DETECTED Final   Influenza B NOT DETECTED NOT DETECTED Final   Parainfluenza Virus 1 NOT DETECTED NOT DETECTED Final   Parainfluenza Virus 2 NOT DETECTED NOT DETECTED Final   Parainfluenza Virus 3 NOT DETECTED NOT DETECTED Final   Parainfluenza Virus 4 NOT DETECTED NOT DETECTED Final   Respiratory Syncytial Virus NOT DETECTED  NOT DETECTED Final   Bordetella pertussis NOT DETECTED NOT DETECTED Final   Chlamydophila pneumoniae NOT DETECTED NOT DETECTED Final   Mycoplasma pneumoniae NOT DETECTED NOT DETECTED Final  Culture, blood (Routine X 2) w Reflex to ID Panel     Status: Abnormal   Collection Time: 05/27/16  9:05 AM  Result Value Ref Range Status   Specimen Description BLOOD RIGHT HAND  Final   Special Requests IN PEDIATRIC BOTTLE 1CC  Final   Culture  Setup Time   Final    GRAM POSITIVE COCCI IN CLUSTERS IN PEDIATRIC BOTTLE CRITICAL RESULT CALLED TO, READ BACK BY AND VERIFIED WITH: CARON AMEND,PHARMD @0252  05/28/16 MKELLY,MLT    Culture STAPHYLOCOCCUS AUREUS (A)  Final   Report Status 05/30/2016 FINAL  Final   Organism ID, Bacteria STAPHYLOCOCCUS AUREUS  Final      Susceptibility   Staphylococcus aureus - MIC*    CIPROFLOXACIN <=0.5 SENSITIVE Sensitive     ERYTHROMYCIN >=8 RESISTANT Resistant     GENTAMICIN <=0.5 SENSITIVE Sensitive     OXACILLIN 0.5 SENSITIVE Sensitive     TETRACYCLINE <=1 SENSITIVE Sensitive     VANCOMYCIN <=0.5 SENSITIVE Sensitive     TRIMETH/SULFA <=10 SENSITIVE Sensitive     CLINDAMYCIN <=0.25  SENSITIVE Sensitive     RIFAMPIN <=0.5 SENSITIVE Sensitive     Inducible Clindamycin NEGATIVE Sensitive     * STAPHYLOCOCCUS AUREUS  Blood Culture ID Panel (Reflexed)     Status: Abnormal   Collection Time: 05/27/16  9:05 AM  Result Value Ref Range Status   Enterococcus species NOT DETECTED NOT DETECTED Final   Vancomycin resistance NOT DETECTED NOT DETECTED Final   Listeria monocytogenes NOT DETECTED NOT DETECTED Final   Staphylococcus species DETECTED (A) NOT DETECTED Final    Comment: CRITICAL RESULT CALLED TO, READ BACK BY AND VERIFIED WITH: CARON AMEND, PHARMD @0252  05/28/16 MKELLY,MLT    Staphylococcus aureus DETECTED (A) NOT DETECTED Final    Comment: CRITICAL RESULT CALLED TO, READ BACK BY AND VERIFIED WITH: CARON AMEND,PHARMD @0252  05/28/16 MKELLY,MLT    Methicillin resistance NOT DETECTED NOT DETECTED Final   Streptococcus species NOT DETECTED NOT DETECTED Final   Streptococcus agalactiae NOT DETECTED NOT DETECTED Final   Streptococcus pneumoniae NOT DETECTED NOT DETECTED Final   Streptococcus pyogenes NOT DETECTED NOT DETECTED Final   Acinetobacter baumannii NOT DETECTED NOT DETECTED Final   Enterobacteriaceae species NOT DETECTED NOT DETECTED Final   Enterobacter cloacae complex NOT DETECTED NOT DETECTED Final   Escherichia coli NOT DETECTED NOT DETECTED Final   Klebsiella oxytoca NOT DETECTED NOT DETECTED Final   Klebsiella pneumoniae NOT DETECTED NOT DETECTED Final   Proteus species NOT DETECTED NOT DETECTED Final   Serratia marcescens NOT DETECTED NOT DETECTED Final   Carbapenem resistance NOT DETECTED NOT DETECTED Final   Haemophilus influenzae NOT DETECTED NOT DETECTED Final   Neisseria meningitidis NOT DETECTED NOT DETECTED Final   Pseudomonas aeruginosa NOT DETECTED NOT DETECTED Final   Candida albicans NOT DETECTED NOT DETECTED Final   Candida glabrata NOT DETECTED NOT DETECTED Final   Candida krusei NOT DETECTED NOT DETECTED Final   Candida parapsilosis NOT  DETECTED NOT DETECTED Final   Candida tropicalis NOT DETECTED NOT DETECTED Final  Culture, blood (Routine X 2) w Reflex to ID Panel     Status: Abnormal   Collection Time: 05/27/16  9:12 AM  Result Value Ref Range Status   Specimen Description BLOOD CENTRAL LINE  Final   Special Requests IN PEDIATRIC BOTTLE 1CC  Final   Culture  Setup Time   Final    GRAM POSITIVE COCCI IN CLUSTERS IN PEDIATRIC BOTTLE CRITICAL VALUE NOTED.  VALUE IS CONSISTENT WITH PREVIOUSLY REPORTED AND CALLED VALUE.    Culture (A)  Final    STAPHYLOCOCCUS AUREUS SUSCEPTIBILITIES PERFORMED ON PREVIOUS CULTURE WITHIN THE LAST 5 DAYS.    Report Status 05/30/2016 FINAL  Final  Culture, blood (routine x 2)     Status: Abnormal   Collection Time: 05/28/16 12:00 PM  Result Value Ref Range Status   Specimen Description BLOOD RIGHT HAND  Final   Special Requests IN PEDIATRIC BOTTLE  .[place]  Final   Culture  Setup Time   Final    GRAM POSITIVE COCCI IN CLUSTERS IN PEDIATRIC BOTTLE CRITICAL VALUE NOTED.  VALUE IS CONSISTENT WITH PREVIOUSLY REPORTED AND CALLED VALUE. CRITICAL RESULT CALLED TO, READ BACK BY AND VERIFIED WITH: CARON AMEND,PHARMD @0710  05/29/16 MKELLY,MLT    Culture (A)  Final    STAPHYLOCOCCUS AUREUS SUSCEPTIBILITIES PERFORMED ON PREVIOUS CULTURE WITHIN THE LAST 5 DAYS.    Report Status 05/30/2016 FINAL  Final  Culture, respiratory (NON-Expectorated)     Status: None   Collection Time: 05/28/16 12:58 PM  Result Value Ref Range Status   Specimen Description TRACHEAL ASPIRATE  Final   Special Requests Normal  Final   Gram Stain   Final    ABUNDANT WBC PRESENT,BOTH PMN AND MONONUCLEAR FEW GRAM POSITIVE COCCI IN PAIRS FEW YEAST    Culture   Final    MODERATE STAPHYLOCOCCUS AUREUS MODERATE CANDIDA ALBICANS    Report Status 06/01/2016 FINAL  Final   Organism ID, Bacteria STAPHYLOCOCCUS AUREUS  Final      Susceptibility   Staphylococcus aureus - MIC*    CIPROFLOXACIN <=0.5 SENSITIVE Sensitive      ERYTHROMYCIN >=8 RESISTANT Resistant     GENTAMICIN <=0.5 SENSITIVE Sensitive     OXACILLIN 0.5 SENSITIVE Sensitive     TETRACYCLINE <=1 SENSITIVE Sensitive     VANCOMYCIN <=0.5 SENSITIVE Sensitive     TRIMETH/SULFA <=10 SENSITIVE Sensitive     CLINDAMYCIN <=0.25 SENSITIVE Sensitive     RIFAMPIN <=0.5 SENSITIVE Sensitive     Inducible Clindamycin NEGATIVE Sensitive     * MODERATE STAPHYLOCOCCUS AUREUS  Culture, blood (Routine X 2) w Reflex to ID Panel     Status: Abnormal   Collection Time: 05/30/16  9:33 AM  Result Value Ref Range Status   Specimen Description BLOOD RIGHT ANTECUBITAL  Final   Special Requests BOTTLES DRAWN AEROBIC AND ANAEROBIC 5CC EA  Final   Culture  Setup Time   Final    IN BOTH AEROBIC AND ANAEROBIC BOTTLES GRAM POSITIVE COCCI IN CLUSTERS CRITICAL RESULT CALLED TO, READ BACK BY AND VERIFIED WITH: TO KHAMMONS(PHRMD)BY TCLEVELAND 06/02/2016 AT 5:09AM    Culture (A)  Final    STAPHYLOCOCCUS AUREUS SUSCEPTIBILITIES PERFORMED ON PREVIOUS CULTURE WITHIN THE LAST 5 DAYS.    Report Status 06/04/2016 FINAL  Final  Blood Culture ID Panel (Reflexed)     Status: Abnormal   Collection Time: 05/30/16  9:33 AM  Result Value Ref Range Status   Enterococcus species NOT DETECTED NOT DETECTED Final   Listeria monocytogenes NOT DETECTED NOT DETECTED Final   Staphylococcus species DETECTED (A) NOT DETECTED Final    Comment: CRITICAL RESULT CALLED TO, READ BACK BY AND VERIFIED WITH: TO KHAMMONS(PHARMD) BY TCLEVELAND 06/04/2016 AT 5:04AM CORRECT CALL DATE 06/02/2016    Staphylococcus aureus DETECTED (A) NOT DETECTED Final    Comment:  CRITICAL RESULT CALLED TO, READ BACK BY AND VERIFIED WITH: TO KHAMMONS(PHARMD) BY TCLEVELAND 06/04/2016 AT 5:04AM CORRECT CALL DATE 06/02/2016    Methicillin resistance NOT DETECTED NOT DETECTED Final   Streptococcus species NOT DETECTED NOT DETECTED Final   Streptococcus agalactiae NOT DETECTED NOT DETECTED Final   Streptococcus pneumoniae NOT  DETECTED NOT DETECTED Final   Streptococcus pyogenes NOT DETECTED NOT DETECTED Final   Acinetobacter baumannii NOT DETECTED NOT DETECTED Final   Enterobacteriaceae species NOT DETECTED NOT DETECTED Final   Enterobacter cloacae complex NOT DETECTED NOT DETECTED Final   Escherichia coli NOT DETECTED NOT DETECTED Final   Klebsiella oxytoca NOT DETECTED NOT DETECTED Final   Klebsiella pneumoniae NOT DETECTED NOT DETECTED Final   Proteus species NOT DETECTED NOT DETECTED Final   Serratia marcescens NOT DETECTED NOT DETECTED Final   Haemophilus influenzae NOT DETECTED NOT DETECTED Final   Neisseria meningitidis NOT DETECTED NOT DETECTED Final   Pseudomonas aeruginosa NOT DETECTED NOT DETECTED Final   Candida albicans NOT DETECTED NOT DETECTED Final   Candida glabrata NOT DETECTED NOT DETECTED Final   Candida krusei NOT DETECTED NOT DETECTED Final   Candida parapsilosis NOT DETECTED NOT DETECTED Final   Candida tropicalis NOT DETECTED NOT DETECTED Final  Culture, blood (Routine X 2) w Reflex to ID Panel     Status: None   Collection Time: 05/30/16  9:41 AM  Result Value Ref Range Status   Specimen Description BLOOD LEFT ANTECUBITAL  Final   Special Requests BOTTLES DRAWN AEROBIC AND ANAEROBIC 8CC EA  Final   Culture NO GROWTH 5 DAYS  Final   Report Status 06/04/2016 FINAL  Final  Culture, blood (Routine X 2) w Reflex to ID Panel     Status: None (Preliminary result)   Collection Time: 06/03/16  1:46 PM  Result Value Ref Range Status   Specimen Description BLOOD RIGHT ARM  Final   Special Requests BOTTLES DRAWN AEROBIC ONLY  5 CC  Final   Culture NO GROWTH < 24 HOURS  Final   Report Status PENDING  Incomplete  Culture, blood (Routine X 2) w Reflex to ID Panel     Status: Abnormal (Preliminary result)   Collection Time: 06/03/16  1:59 PM  Result Value Ref Range Status   Specimen Description BLOOD LEFT HAND  Final   Special Requests IN PEDIATRIC BOTTLE  1.0 CC  Final   Culture  Setup Time    Final    GRAM POSITIVE COCCI IN CLUSTERS AEROBIC BOTTLE ONLY CRITICAL RESULT CALLED TO, READ BACK BY AND VERIFIED WITHJerel Shepherd PHARMD 1929 06/04/16 A BROWNING    Culture (A)  Final    STAPHYLOCOCCUS AUREUS SUSCEPTIBILITIES TO FOLLOW    Report Status PENDING  Incomplete  Radiology Studies: Dg Chest Port 1 View  Result Date: 06/04/2016 CLINICAL DATA:  New left IJ catheter.  History of renal failure. EXAM: PORTABLE CHEST 1 VIEW COMPARISON:  05/28/2016 CXR FINDINGS: Borderline cardiomegaly. Aortic atherosclerosis. Mild vascular congestion with bibasilar atelectasis. No pneumothorax. Left IJ catheter tip projects over the expected location of the proximal SVC. No acute osseous abnormality. IMPRESSION: Borderline cardiomegaly with mild vascular congestion and bibasilar atelectasis. No pneumothorax. Left IJ catheter tip projects over the expected region of the proximal SVC. Electronically Signed   By: Ashley Royalty M.D.   On: 06/04/2016 16:37      Scheduled Meds: .  ceFAZolin (ANCEF) IV  2 g Intravenous Q12H  . feeding supplement (NEPRO CARB STEADY)  237 mL Oral  BID BM  . insulin aspart  0-20 Units Subcutaneous Q4H  . insulin glargine  26 Units Subcutaneous Q24H  . mouth rinse  15 mL Mouth Rinse BID   Continuous Infusions:   LOS: 9 days    Time spent in minutes: 26    [place], MD [facility] Pager: [email or web address] Password [email or web address] 06/05/2016, 11:58 AM

## 2016-06-05 NOTE — Clinical Social Work Note (Signed)
Clinical Social Work Assessment  Patient Details  Name: Tracy Barker MRN: [id number] Date of Birth: 06-Jun-1945  Date of referral:  06/02/16               Reason for consult:  Facility Placement                Permission sought to share information with:  Family Supports Permission granted to share information::  Yes, Verbal Permission Granted  Name::     Tracy Barker  Agency::     Relationship::  Daughter  Contact Information:  [phone]  Housing/Transportation Living arrangements for the past 2 months:  [place] of Information:  Patient Patient Interpreter Needed:  None Criminal Activity/Legal Involvement Pertinent to Current Situation/Hospitalization:  No - Comment as needed Significant Relationships:  Adult Children Tracy Barker) Lives with:  Self Do you feel safe going back to the place where you live?  No (Patient agreeable to ST rehab as she lives alone) Need for family participation in patient care:  No (Coment)  Care giving concerns:  Patient in agreement with ST rehab as she lives alone.   Social Worker assessment / plan:  CSW talked with patient at the bedside regarding discharge disposition and recommendation of ST rehab. [name] lives in [place], [place] and provided CSW with 2 facilities in the area of where she lives: [facility] in [place] and [facility] in [place], [place].  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  Patient/Family's Understanding of and Emotional Response to Diagnosis, Current Treatment, and Prognosis: Not discussed.  Emotional Assessment Appearance:  Appears stated  age Attitude/Demeanor/Rapport:  Other (Appropriate) Affect (typically observed):  Appropriate Orientation:  Oriented to Self, Oriented to Place, Oriented to  Time, Oriented to Situation Alcohol / Substance use:  Tobacco Use, Alcohol Use, Illicit Drugs (Patient reported no tobacco, alcohol or drug use) Psych involvement (Current and /or in the community):  No (Comment)  Discharge Needs  Concerns to be addressed:  Discharge Planning Concerns Readmission within the last 30 days:  No Current discharge risk:  None Barriers to Discharge:  No Barriers Identified   Tracy Feil, LCSW 06/05/2016, 3:20 PM

## 2016-06-05 NOTE — Progress Notes (Signed)
Patient ID: Tracy Barker, female   DOB: 24-Oct-1945, 71 y.o.   MRN: [id number] The patient remains with a persistent high white blood cell count and is anemic. The toes on her left foot remained stable. The small wounds on the dorsum of her second, third, and fifth toes are all dry and I can still not express any type of purulent material from any of these. Although the x-rays of her left foot are consistent with osteomyelitis of the second toe, this appears chronic. Based on the minimal foot swelling and with no evidence of gross purulence I will see how this could be the source of her bacteremia and her overwhelming sepsis. Certainly the second toe can be removed, but there is no rush to do so until she is medically optimized. Due to the recent snow and bad weather conditions, I was unable to post her for surgery. However I still do not feel any urgent surgery was needed on her foot. I will continue to follow her closely and will see about setting her up for a simple to removal as she stabilizes. Again, I will see the source of her bacteremia being from such a minimal toe infection. Especially giving the clinical exam currently on her left foot.

## 2016-06-05 NOTE — Progress Notes (Addendum)
CRITICAL VALUE ALERT  Critical value received:  hgb 6.9  Date of notification:  06/05/16  Time of notification:  07:43  Critical value read back: yes  Nurse who received alert:  Cassell Smiles [phone]  MD notified (1st page):  Dr. Wynelle Cleveland  Time of first page:  09:01  MD notified (2nd page):  Time of second page:  Responding MD:  Dr. Wynelle Cleveland  Time MD responded:  09:06

## 2016-06-06 LAB — CBC
HCT: 22.9 % — ABNORMAL LOW (ref 36.0–46.0)
Hemoglobin: 7.4 g/dL — ABNORMAL LOW (ref 12.0–15.0)
MCH: 27.6 pg (ref 26.0–34.0)
MCHC: 32.3 g/dL (ref 30.0–36.0)
MCV: 85.4 fL (ref 78.0–100.0)
PLATELETS: 69 10*3/uL — AB (ref 150–400)
RBC: 2.68 MIL/uL — AB (ref 3.87–5.11)
RDW: 15.3 % (ref 11.5–15.5)
WBC: 38.9 10*3/uL — ABNORMAL HIGH (ref 4.0–10.5)

## 2016-06-06 LAB — TYPE AND SCREEN
ABO/RH(D): O NEG
Antibody Screen: NEGATIVE
UNIT DIVISION: 0

## 2016-06-06 LAB — BASIC METABOLIC PANEL
Anion gap: 9 (ref 5–15)
BUN: 79 mg/dL — AB (ref 6–20)
CALCIUM: 8 mg/dL — AB (ref 8.9–10.3)
CHLORIDE: 103 mmol/L (ref 101–111)
CO2: 23 mmol/L (ref 22–32)
CREATININE: 2.09 mg/dL — AB (ref 0.44–1.00)
GFR calc non Af Amer: 23 mL/min — ABNORMAL LOW (ref >60)
GFR, EST AFRICAN AMERICAN: 26 mL/min — AB (ref >60)
Glucose, Bld: 366 mg/dL — ABNORMAL HIGH (ref 65–99)
Potassium: 3.8 mmol/L (ref 3.5–5.1)
SODIUM: 135 mmol/L (ref 135–145)

## 2016-06-06 LAB — GLUCOSE, CAPILLARY
GLUCOSE-CAPILLARY: 207 mg/dL — AB (ref 65–99)
GLUCOSE-CAPILLARY: 330 mg/dL — AB (ref 65–99)
GLUCOSE-CAPILLARY: 257 mg/dL — AB (ref 65–99)
Glucose-Capillary: 225 mg/dL — ABNORMAL HIGH (ref 65–99)
Glucose-Capillary: 189 mg/dL — ABNORMAL HIGH (ref 65–99)

## 2016-06-06 LAB — CULTURE, BLOOD (ROUTINE X 2)

## 2016-06-06 MED ORDER — INSULIN ASPART 100 UNIT/ML ~~LOC~~ SOLN: 0.0000 [IU] | Freq: Three times a day (TID) | SUBCUTANEOUS | Status: DC

## 2016-06-06 MED ORDER — INSULIN ASPART 100 UNIT/ML ~~LOC~~ SOLN
0.0000 [IU] | Freq: Three times a day (TID) | SUBCUTANEOUS | Status: DC
  Administered 2016-06-06: 11 [IU] via SUBCUTANEOUS
  Administered 2016-06-06: 15 [IU] via SUBCUTANEOUS
  Administered 2016-06-07: 4 [IU] via SUBCUTANEOUS
  Administered 2016-06-07: 11 [IU] via SUBCUTANEOUS
  Administered 2016-06-07: 7 [IU] via SUBCUTANEOUS
  Administered 2016-06-08: 11 [IU] via SUBCUTANEOUS
  Administered 2016-06-08: 15 [IU] via SUBCUTANEOUS
  Administered 2016-06-08 – 2016-06-09 (×2): 7 [IU] via SUBCUTANEOUS
  Administered 2016-06-09: 4 [IU] via SUBCUTANEOUS
  Administered 2016-06-09: 11 [IU] via SUBCUTANEOUS
  Administered 2016-06-10: 7 [IU] via SUBCUTANEOUS
  Administered 2016-06-10 – 2016-06-11 (×3): 4 [IU] via SUBCUTANEOUS

## 2016-06-06 MED ORDER — INSULIN GLARGINE 100 UNIT/ML ~~LOC~~ SOLN
22.0000 [IU] | SUBCUTANEOUS | Status: DC
  Administered 2016-06-06 – 2016-06-07 (×2): 22 [IU] via SUBCUTANEOUS
  Filled 2016-06-06 (×2): qty 0.22

## 2016-06-06 MED ORDER — INSULIN ASPART 100 UNIT/ML ~~LOC~~ SOLN
3.0000 [IU] | Freq: Three times a day (TID) | SUBCUTANEOUS | Status: DC
  Administered 2016-06-06 – 2016-06-11 (×13): 3 [IU] via SUBCUTANEOUS

## 2016-06-06 MED ORDER — INSULIN GLARGINE 100 UNIT/ML ~~LOC~~ SOLN: 30.0000 [IU] | SUBCUTANEOUS | Status: DC

## 2016-06-06 NOTE — Progress Notes (Addendum)
PROGRESS NOTE    Tracy Barker   [id number]  DOB: [birth year]  DOA: 05/27/2016 PCP: No primary care provider on file.   Brief Narrative:  71 year old female with PMH significant for DM, HTN, and CLL.   She was recently been seen there for cough x 1 month treated with Zpak, and fall with no apparent injury. 1/9 she presented to [facility] for weakness x about 24 hours and several episodes of vomiting.  EMS was called and upon their arrival she was alert and oriented. In ED she was noted to additionally have some L sided weakness. Laboratory evaluation significant for hyperkalemia, renal failure, and leukocytosis. She was given broad spectrum antibiotics (mero and linezolid) and temporizing therapies for hyperkalemia were used.  Despite these therapies she became progressively more lethargic and developed shock. She was started on levophed with escalating doses and eventually was started on epinephrine as well.  She was transferred on 1/10 to [facility] for ICU admission. She was intubated for acute hypoxemic respiratory failure on arrival to [facility] and received CVVHD for AKI.   Subjective: No complaints. Per RN, she had a mild nose bleed yesterday.   Assessment & Plan:  MSSA bacteremia / septic shock  - extubated, off pressors - Negative TEE -Repeat blood culture 1/13  remained positive, ? Source from foot? 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 and low Iron saturation- start oral Iron    AKI/ CKD III - ATN - She required CRRT while in ICU - Nephrology has signed off- if Cr  stable today, will d/c foley  Cavitary lung masses/ acute respiratory failure  -Likely septic emboli due to bacteremia vs aspiration pneumonia with cavitations  -no longer hypoxic   Thrombocytopenia - likely from infection  -  HIT panel negative -   h/o CLL, CT abd/pelvis on presentation did not reveal liver or spleen abnormalities - platelets dropped to 30 - checked for DIC with INR/ PT/ PTT, Fibronogen, d dimer- no evidence of DIC - cont to follow closely-  - will need to transfuse Platelets if she begins to bleed  Hypotension with h/o HTN - holding Catapres, Diltiazem, HCTZ, Ramipril -  stress dose steroids have been tapered off  CLL - chronically elevated WBC counts  Left sided toe wounds/ necrosis - ortho plans for "amputation of her 2nd toe with debridement of her other toe wounds vs amputation of the other toes " by the end of this week  PAF - she required amiodarone drip in ICU  - currently NSR - diltiazem on hold due to hypotension - ECHO mentioned below - check TSH - She has significant thrombocytopenia, not a candidate for anticoagulation.   Diabetes - poorly controlled, previously non-insulin dependent - A1c 10.3  - Will need to discharge on insulin- started insulin teaching - NOTE: she did not received her lantus last night - daughter declined it to be given as glucose was 172 and daughter felt is was "controlled"- I have educated her  today about never holding Lantus dose- explained that she can decrease to dose to 1/2 only if the patient is not eating anything or vomiting   Morbid obesity Body mass index is 33.27 kg/m.   DVT prophylaxis: SCDs Code Status: Full code Family Communication: daughter Disposition Plan: to be determined- patient and daughter hoping to get her to SNF in [place] which is where she lives Consultants:   Ortho  ID  PCCM  Nephrology  Procedures:  Intubation on 1/10>>1/12 R fem CVL 1/10 >>> Aline placement and  removal TEE on 1/12 CVVHD  STUDIES: CT head 1/9> non-acute CT abdomen pelvis 1/9 > non-acute, gallbladder distention, colonic wall thickening CT chest 1/9 >bilateral opacities and cavitary lung masses  US Renal 1/11 >no evidence of hydronephrosis; 3 cm left adrenal mass  DG Left Foot >>osteomyelitis and septic arthritis  2 D ECHO >> Left ventricle: The cavity size was normal. Systolic function was   vigorous. The estimated ejection fraction was in the range of 65%   to 70%. Wall motion was normal; there were no regional wall   motion abnormalities. Doppler parameters are consistent with   abnormal left ventricular relaxation (grade 1 diastolic   dysfunction). - Ventricular septum: The contour showed diastolic flattening. - Mitral valve: Calcified annulus. - Right ventricle: The cavity size was severely dilated. Wall   thickness was normal. Systolic function was mildly reduced. - Pulmonary arteries: Systolic pressure was severely increased. PA   peak pressure: 71 mm Hg (S).  Antimicrobials:  Anti-infectives    Start     Dose/Rate Route Frequency Ordered Stop   06/05/16 1100  ceFAZolin (ANCEF) IVPB 2g/100 mL premix     2 g 200 mL/hr over 30 Minutes Intravenous Every 12 hours 06/05/16 1017     06/04/16 1115  cefTRIAXone (ROCEPHIN) injection 1 g  Status:  Discontinued     1 g Intramuscular Every 24 hours 06/04/16 1106 06/05/16 1013   06/04/16 1115  cefTRIAXone (ROCEPHIN) injection 1 g  Status:  Discontinued     1 g Intramuscular Every 24 hours 06/04/16 1106 06/05/16 1013   06/04/16 1100  cefTRIAXone (ROCEPHIN) injection 1 g  Status:  Discontinued     1 g Intramuscular Every 24 hours 06/04/16 0959 06/04/16 1104   06/04/16 1100  cefTRIAXone (ROCEPHIN) injection 1 g  Status:  Discontinued     1 g Intramuscular Every 24 hours 06/04/16 0959 06/04/16 1104   06/03/16 1800  ceFAZolin (ANCEF) injection 1.5 g  Status:  Discontinued     1.5 g Intramuscular Every 24 hours 06/03/16 1507  06/04/16 0955   06/03/16 1100  ceFAZolin (ANCEF) IVPB 1 g/50 mL premix  Status:  Discontinued     1 g 100 mL/hr over 30 Minutes Intravenous Every 12 hours 06/03/16 1015 06/03/16 1506   05/31/16 1400  ceFAZolin (ANCEF) IVPB 1 g/50 mL premix  Status:  Discontinued     1 g 100 mL/hr over 30 Minutes Intravenous Every 24 hours 05/31/16 1306 06/03/16 1015   05/28/16 1830  linezolid (ZYVOX) IVPB 600 mg  Status:  Discontinued     600 mg 300 mL/hr over 60 Minutes Intravenous Every 12 hours 05/28/16 1130 05/31/16 1235   05/28/16 0800  meropenem (MERREM) 500 mg in sodium chloride 0.9 % 50 mL IVPB  Status:  Discontinued     500 mg 100 mL/hr over 30 Minutes Intravenous Every 24 hours 05/27/16 0743 05/27/16 1645   05/27/16 2000  meropenem (MERREM) 1 g in sodium  chloride 0.9 % 100 mL IVPB  Status:  Discontinued     1 g 200 mL/hr over 30 Minutes Intravenous Every 12 hours 05/27/16 1645 05/28/16 1130   05/27/16 0730  meropenem (MERREM) 1 g in sodium chloride 0.9 % 100 mL IVPB     1 g 200 mL/hr over 30 Minutes Intravenous  Once 05/27/16 0632 05/27/16 0808   05/27/16 0730  linezolid (ZYVOX) IVPB 600 mg  Status:  Discontinued     600 mg 300 mL/hr over 60 Minutes Intravenous Every 12 hours 05/27/16 0632 05/28/16 1056       Objective: Vitals:   06/05/16 2056 06/06/16 0412 06/06/16 0433 06/06/16 0930  BP: (!) 107/48  (!) 94/41 (!) 101/50  Pulse: 84  91 94  Resp: 18  20 18   Temp: 98.6 F (37 C)  99.5 F (37.5 C) 99 F (37.2 C)  TempSrc:    Oral  SpO2: 92%  92% 95%  Weight:  73.5 kg (162 lb 0.6 oz)    Height:        Intake/Output Summary (Last 24 hours) at 06/06/16 1241 Last data filed at 06/06/16 0900  Gross per 24 hour  Intake             1020 ml  Output             1700 ml  Net             -680 ml   Filed Weights   06/03/16 0437 06/03/16 2032 06/06/16 0412  Weight: 76.2 kg (168 lb) 73.5 kg (162 lb) 73.5 kg (162 lb 0.6 oz)    Examination: General exam: Appears comfortable  HEENT:  PERRLA, oral mucosa moist, no sclera icterus or thrush Respiratory system: Clear to auscultation. Respiratory effort normal. Cardiovascular system: S1 & S2 heard, RRR.  No murmurs  Gastrointestinal system: Abdomen soft, non-tender, nondistended. Normal bowel sound. No organomegaly Central nervous system: Alert and oriented. No focal neurological deficits. Extremities: No cyanosis, clubbing or edema Skin: necrosis and ulcers on multiple left toes with swelling of the foot Psychiatry:  Mood & affect appropriate.     Data Reviewed: I have personally reviewed following labs and imaging studies  CBC:  Recent Labs Lab 05/31/16 0315 06/01/16 0300 06/02/16 0410 06/03/16 0826 06/03/16 1036 06/04/16 0641 06/05/16 0741 06/05/16 1615  WBC 48.8* 28.6* 40.6* 52.1* 48.8* 42.1* 45.7*  --   NEUTROABS 19.5* 9.2*  --   --  12.2*  --   --   --   HGB 9.0* 7.6* 8.0* 8.7* 8.2* 7.1* 6.9* 7.5*  HCT 27.1* 23.0* 24.3* 26.9* 25.1* 22.3* 21.7* 23.5*  MCV 81.9 82.4 83.2 83.3 83.4 83.8 84.1  --   PLT 72* 40* 35* 29* 30* 30* 46*  --    Basic Metabolic Panel:  Recent Labs Lab 05/30/16 1741 05/31/16 0315  06/02/16 0410 06/03/16 0621 06/03/16 0826 06/04/16 0641 06/05/16 0741  NA 133* 134*  < > 134* 137 138 140 139  K 5.4* 4.9  < > 4.8 4.0 3.6 4.1 3.8  CL 100* 102  < > 103 105 104 107 105  CO2 22 22  < > 20* 20* 21* 22 24  GLUCOSE 458* 168*  < > 108* 124* 115* 99 178*  BUN 38* 47*  < > 86* 95* 91* 97* 91*  CREATININE 2.07* 2.44*  < > 3.57* 3.37* 3.26* 2.84* 2.50*  CALCIUM 7.3* 7.8*  < > 8.1* 7.9* 8.3* 8.3* 8.4*  MG  --  2.4  --   --   --   --   --   --   PHOS 5.4* 4.8*  --   --   --   --   --   --   < > = values in this interval not displayed. GFR: Estimated Creatinine Clearance: 20.6 mL/min (by C-G formula based on SCr of 2.5 mg/dL (H)). Liver Function Tests:  Recent Labs Lab 05/30/16 1741 05/31/16 0315 06/03/16 0621  AST  --   --  27  ALT  --   --  18  ALKPHOS  --   --  315*  BILITOT   --   --  0.9  PROT  --   --  3.6*  ALBUMIN 1.6* 1.8* 1.7*   No results for input(s): LIPASE, AMYLASE in the last 168 hours. No results for input(s): AMMONIA in the last 168 hours. Coagulation Profile:  Recent Labs Lab 06/03/16 1036  INR 1.07   Cardiac Enzymes: No results for input(s): CKTOTAL, CKMB, CKMBINDEX, TROPONINI in the last 168 hours. BNP (last 3 results) No results for input(s): PROBNP in the last 8760 hours. HbA1C: No results for input(s): HGBA1C in the last 72 hours. CBG:  Recent Labs Lab 06/05/16 2047 06/05/16 2327 06/06/16 0427 06/06/16 0750 06/06/16 1222  GLUCAP 236* 172* 207* 225* 330*   Lipid Profile: No results for input(s): CHOL, HDL, LDLCALC, TRIG, CHOLHDL, LDLDIRECT in the last 72 hours. Thyroid Function Tests: No results for input(s): TSH, T4TOTAL, FREET4, T3FREE, THYROIDAB in the last 72 hours. Anemia Panel:  Recent Labs  06/05/16 1041  VITAMINB12 2,583*  FOLATE 7.6  FERRITIN 318*  TIBC 273  IRON 17*  RETICCTPCT 1.0   Urine analysis:    Component Value Date/Time   COLORURINE AMBER (A) 05/27/2016 0804   APPEARANCEUR CLOUDY (A) 05/27/2016 0804   LABSPEC 1.023 05/27/2016 0804   PHURINE 5.0 05/27/2016 0804   GLUCOSEU 150 (A) 05/27/2016 0804   HGBUR SMALL (A) 05/27/2016 0804   BILIRUBINUR NEGATIVE 05/27/2016 0804   KETONESUR NEGATIVE 05/27/2016 0804   PROTEINUR 100 (A) 05/27/2016 0804   NITRITE NEGATIVE 05/27/2016 0804   LEUKOCYTESUR NEGATIVE 05/27/2016 0804   Sepsis Labs: @LABRCNTIP (procalcitonin:4,lacticidven:4) ) Recent Results (from the past 240 hour(s))  Culture, blood (routine x 2)     Status: Abnormal   Collection Time: 05/28/16 12:00 PM  Result Value Ref Range Status   Specimen Description BLOOD RIGHT HAND  Final   Special Requests IN PEDIATRIC BOTTLE  .[place]  Final   Culture  Setup Time   Final    GRAM POSITIVE COCCI IN CLUSTERS IN PEDIATRIC BOTTLE CRITICAL VALUE NOTED.  VALUE IS CONSISTENT WITH PREVIOUSLY REPORTED AND  CALLED VALUE. CRITICAL RESULT CALLED TO, READ BACK BY AND VERIFIED WITH: CARON AMEND,PHARMD @0710  05/29/16 MKELLY,MLT    Culture (A)  Final    STAPHYLOCOCCUS AUREUS SUSCEPTIBILITIES PERFORMED ON PREVIOUS CULTURE WITHIN THE LAST 5 DAYS.    Report Status 05/30/2016 FINAL  Final  Culture, respiratory (NON-Expectorated)     Status: None   Collection Time: 05/28/16 12:58 PM  Result Value Ref Range Status   Specimen Description TRACHEAL ASPIRATE  Final   Special Requests Normal  Final   Gram Stain   Final    ABUNDANT WBC PRESENT,BOTH PMN AND MONONUCLEAR FEW GRAM POSITIVE COCCI IN PAIRS FEW YEAST    Culture   Final    MODERATE STAPHYLOCOCCUS AUREUS MODERATE CANDIDA ALBICANS    Report Status 06/01/2016 FINAL  Final   Organism  ID, Bacteria STAPHYLOCOCCUS AUREUS  Final      Susceptibility   Staphylococcus aureus - MIC*    CIPROFLOXACIN <=0.5 SENSITIVE Sensitive     ERYTHROMYCIN >=8 RESISTANT Resistant     GENTAMICIN <=0.5 SENSITIVE Sensitive     OXACILLIN 0.5 SENSITIVE Sensitive     TETRACYCLINE <=1 SENSITIVE Sensitive     VANCOMYCIN <=0.5 SENSITIVE Sensitive     TRIMETH/SULFA <=10 SENSITIVE Sensitive     CLINDAMYCIN <=0.25 SENSITIVE Sensitive     RIFAMPIN <=0.5 SENSITIVE Sensitive     Inducible Clindamycin NEGATIVE Sensitive     * MODERATE STAPHYLOCOCCUS AUREUS  Culture, blood (Routine X 2) w Reflex to ID Panel     Status: Abnormal   Collection Time: 05/30/16  9:33 AM  Result Value Ref Range Status   Specimen Description BLOOD RIGHT ANTECUBITAL  Final   Special Requests BOTTLES DRAWN AEROBIC AND ANAEROBIC 5CC EA  Final   Culture  Setup Time   Final    IN BOTH AEROBIC AND ANAEROBIC BOTTLES GRAM POSITIVE COCCI IN CLUSTERS CRITICAL RESULT CALLED TO, READ BACK BY AND VERIFIED WITH: TO KHAMMONS(PHRMD)BY TCLEVELAND 06/02/2016 AT 5:09AM    Culture (A)  Final    STAPHYLOCOCCUS AUREUS SUSCEPTIBILITIES PERFORMED ON PREVIOUS CULTURE WITHIN THE LAST 5 DAYS.    Report Status 06/04/2016  FINAL  Final  Blood Culture ID Panel (Reflexed)     Status: Abnormal   Collection Time: 05/30/16  9:33 AM  Result Value Ref Range Status   Enterococcus species NOT DETECTED NOT DETECTED Final   Listeria monocytogenes NOT DETECTED NOT DETECTED Final   Staphylococcus species DETECTED (A) NOT DETECTED Final    Comment: CRITICAL RESULT CALLED TO, READ BACK BY AND VERIFIED WITH: TO KHAMMONS(PHARMD) BY TCLEVELAND 06/04/2016 AT 5:04AM CORRECT CALL DATE 06/02/2016    Staphylococcus aureus DETECTED (A) NOT DETECTED Final    Comment: CRITICAL RESULT CALLED TO, READ BACK BY AND VERIFIED WITH: TO KHAMMONS(PHARMD) BY TCLEVELAND 06/04/2016 AT 5:04AM CORRECT CALL DATE 06/02/2016    Methicillin resistance NOT DETECTED NOT DETECTED Final   Streptococcus species NOT DETECTED NOT DETECTED Final   Streptococcus agalactiae NOT DETECTED NOT DETECTED Final   Streptococcus pneumoniae NOT DETECTED NOT DETECTED Final   Streptococcus pyogenes NOT DETECTED NOT DETECTED Final   Acinetobacter baumannii NOT DETECTED NOT DETECTED Final   Enterobacteriaceae species NOT DETECTED NOT DETECTED Final   Enterobacter cloacae complex NOT DETECTED NOT DETECTED Final   Escherichia coli NOT DETECTED NOT DETECTED Final   Klebsiella oxytoca NOT DETECTED NOT DETECTED Final   Klebsiella pneumoniae NOT DETECTED NOT DETECTED Final   Proteus species NOT DETECTED NOT DETECTED Final   Serratia marcescens NOT DETECTED NOT DETECTED Final   Haemophilus influenzae NOT DETECTED NOT DETECTED Final   Neisseria meningitidis NOT DETECTED NOT DETECTED Final   Pseudomonas aeruginosa NOT DETECTED NOT DETECTED Final   Candida albicans NOT DETECTED NOT DETECTED Final   Candida glabrata NOT DETECTED NOT DETECTED Final   Candida krusei NOT DETECTED NOT DETECTED Final   Candida parapsilosis NOT DETECTED NOT DETECTED Final   Candida tropicalis NOT DETECTED NOT DETECTED Final  Culture, blood (Routine X 2) w Reflex to ID Panel     Status: None    Collection Time: 05/30/16  9:41 AM  Result Value Ref Range Status   Specimen Description BLOOD LEFT ANTECUBITAL  Final   Special Requests BOTTLES DRAWN AEROBIC AND ANAEROBIC 8CC EA  Final   Culture NO GROWTH 5 DAYS  Final   Report Status  06/04/2016 FINAL  Final  Culture, blood (Routine X 2) w Reflex to ID Panel     Status: None (Preliminary result)   Collection Time: 06/03/16  1:46 PM  Result Value Ref Range Status   Specimen Description BLOOD RIGHT ARM  Final   Special Requests BOTTLES DRAWN AEROBIC ONLY  5 CC  Final   Culture NO GROWTH 3 DAYS  Final   Report Status PENDING  Incomplete  Culture, blood (Routine X 2) w Reflex to ID Panel     Status: Abnormal   Collection Time: 06/03/16  1:59 PM  Result Value Ref Range Status   Specimen Description BLOOD LEFT HAND  Final   Special Requests IN PEDIATRIC BOTTLE  1.0 CC  Final   Culture  Setup Time   Final    GRAM POSITIVE COCCI IN CLUSTERS AEROBIC BOTTLE ONLY CRITICAL RESULT CALLED TO, READ BACK BY AND VERIFIED WITHJerel Shepherd PHARMD 1929 06/04/16 A BROWNING    Culture STAPHYLOCOCCUS AUREUS (A)  Final   Report Status 06/06/2016 FINAL  Final   Organism ID, Bacteria STAPHYLOCOCCUS AUREUS  Final      Susceptibility   Staphylococcus aureus - MIC*    CIPROFLOXACIN <=0.5 SENSITIVE Sensitive     ERYTHROMYCIN <=0.25 SENSITIVE Sensitive     GENTAMICIN <=0.5 SENSITIVE Sensitive     OXACILLIN 0.5 SENSITIVE Sensitive     TETRACYCLINE <=1 SENSITIVE Sensitive     VANCOMYCIN <=0.5 SENSITIVE Sensitive     TRIMETH/SULFA <=10 SENSITIVE Sensitive     CLINDAMYCIN <=0.25 SENSITIVE Sensitive     RIFAMPIN <=0.5 SENSITIVE Sensitive     Inducible Clindamycin NEGATIVE Sensitive     * STAPHYLOCOCCUS AUREUS  Radiology Studies: Dg Chest Port 1 View  Result Date: 06/04/2016 CLINICAL DATA:  New left IJ catheter.  History of renal failure. EXAM: PORTABLE CHEST 1 VIEW COMPARISON:  05/28/2016 CXR FINDINGS: Borderline cardiomegaly. Aortic atherosclerosis. Mild  vascular congestion with bibasilar atelectasis. No pneumothorax. Left IJ catheter tip projects over the expected location of the proximal SVC. No acute osseous abnormality. IMPRESSION: Borderline cardiomegaly with mild vascular congestion and bibasilar atelectasis. No pneumothorax. Left IJ catheter tip projects over the expected region of the proximal SVC. Electronically Signed   By: Ashley Royalty M.D.   On: 06/04/2016 16:37      Scheduled Meds: .  ceFAZolin (ANCEF) IV  2 g Intravenous Q12H  . feeding supplement (NEPRO CARB STEADY)  237 mL Oral BID BM  . insulin aspart  0-20 Units Subcutaneous Q4H  . insulin glargine  22 Units Subcutaneous Q24H  . mouth rinse  15 mL Mouth Rinse BID   Continuous Infusions:   LOS: 10 days    Time spent in minutes: 22    [place], MD [facility] Pager: [email or web address] Password [email or web address] 06/06/2016, 12:41 PM

## 2016-06-06 NOTE — Progress Notes (Signed)
Foley catheter removed per MD order.  Patient tolerated well.  

## 2016-06-06 NOTE — Progress Notes (Signed)
[facility]   Reason for visit: Follow up on MSSA bacteremia with osteomyelitis  Interval History: no surgery yet; no fever, feels well, no new complaints; multiple family at bedside.   Physical Exam: Constitutional:  Vitals:   06/06/16 0433 06/06/16 0930  BP: (!) 94/41 (!) 101/50  Pulse: 91 94  Resp: 20 18  Temp: 99.5 F (37.5 C) 99 F (37.2 C)   patient appears in NAD Respiratory: Normal respiratory effort; CTA B Cardiovascular: RRR GI: soft, nt  Review of Systems: Constitutional: negative for fevers and chills Gastrointestinal: negative for diarrhea Integument/breast: negative for rash  Lab Results  Component Value Date   WBC 38.9 (H) 06/06/2016   HGB 7.4 (L) 06/06/2016   HCT 22.9 (L) 06/06/2016   MCV 85.4 06/06/2016   PLT 69 (L) 06/06/2016    Lab Results  Component Value Date   CREATININE 2.09 (H) 06/06/2016   BUN 79 (H) 06/06/2016   NA 135 06/06/2016   K 3.8 06/06/2016   CL 103 06/06/2016   CO2 23 06/06/2016    Lab Results  Component Value Date   ALT 18 06/03/2016   AST 27 06/03/2016   ALKPHOS 315 (H) 06/03/2016     Microbiology: Recent Results (from the past 240 hour(s))  Culture, blood (routine x 2)     Status: Abnormal   Collection Time: 05/28/16 12:00 PM  Result Value Ref Range Status   Specimen Description BLOOD RIGHT HAND  Final   Special Requests IN PEDIATRIC BOTTLE  .[place]  Final   Culture  Setup Time   Final    GRAM POSITIVE COCCI IN CLUSTERS IN PEDIATRIC BOTTLE CRITICAL VALUE NOTED.  VALUE IS CONSISTENT WITH PREVIOUSLY REPORTED AND CALLED VALUE. CRITICAL RESULT CALLED TO, READ BACK BY AND VERIFIED WITH: CARON AMEND,PHARMD @0710  05/29/16 MKELLY,MLT    Culture (A)  Final    STAPHYLOCOCCUS AUREUS SUSCEPTIBILITIES PERFORMED ON PREVIOUS CULTURE WITHIN THE LAST 5 DAYS.    Report Status 05/30/2016 FINAL  Final  Culture, respiratory (NON-Expectorated)     Status: None   Collection Time: 05/28/16 12:58 PM    Result Value Ref Range Status   Specimen Description TRACHEAL ASPIRATE  Final   Special Requests Normal  Final   Gram Stain   Final    ABUNDANT WBC PRESENT,BOTH PMN AND MONONUCLEAR FEW GRAM POSITIVE COCCI IN PAIRS FEW YEAST    Culture   Final    MODERATE STAPHYLOCOCCUS AUREUS MODERATE CANDIDA ALBICANS    Report Status 06/01/2016 FINAL  Final   Organism ID, Bacteria STAPHYLOCOCCUS AUREUS  Final      Susceptibility   Staphylococcus aureus - MIC*    CIPROFLOXACIN <=0.5 SENSITIVE Sensitive     ERYTHROMYCIN >=8 RESISTANT Resistant     GENTAMICIN <=0.5 SENSITIVE Sensitive     OXACILLIN 0.5 SENSITIVE Sensitive     TETRACYCLINE <=1 SENSITIVE Sensitive     VANCOMYCIN <=0.5 SENSITIVE Sensitive     TRIMETH/SULFA <=10 SENSITIVE Sensitive     CLINDAMYCIN <=0.25 SENSITIVE Sensitive     RIFAMPIN <=0.5 SENSITIVE Sensitive     Inducible Clindamycin NEGATIVE Sensitive     * MODERATE STAPHYLOCOCCUS AUREUS  Culture, blood (Routine X 2) w Reflex to ID Panel     Status: Abnormal   Collection Time: 05/30/16  9:33 AM  Result Value Ref Range Status   Specimen Description BLOOD RIGHT ANTECUBITAL  Final   Special Requests BOTTLES DRAWN AEROBIC AND ANAEROBIC 5CC EA  Final   Culture  Setup Time   Final    IN BOTH AEROBIC AND ANAEROBIC BOTTLES GRAM POSITIVE COCCI IN CLUSTERS CRITICAL RESULT CALLED TO, READ BACK BY AND VERIFIED WITH: TO KHAMMONS(PHRMD)BY TCLEVELAND 06/02/2016 AT 5:09AM    Culture (A)  Final    STAPHYLOCOCCUS AUREUS SUSCEPTIBILITIES PERFORMED ON PREVIOUS CULTURE WITHIN THE LAST 5 DAYS.    Report Status 06/04/2016 FINAL  Final  Blood Culture ID Panel (Reflexed)     Status: Abnormal   Collection Time: 05/30/16  9:33 AM  Result Value Ref Range Status   Enterococcus species NOT DETECTED NOT DETECTED Final   Listeria monocytogenes NOT DETECTED NOT DETECTED Final   Staphylococcus species DETECTED (A) NOT DETECTED Final    Comment: CRITICAL RESULT CALLED TO, READ BACK BY AND VERIFIED  WITH: TO KHAMMONS(PHARMD) BY TCLEVELAND 06/04/2016 AT 5:04AM CORRECT CALL DATE 06/02/2016    Staphylococcus aureus DETECTED (A) NOT DETECTED Final    Comment: CRITICAL RESULT CALLED TO, READ BACK BY AND VERIFIED WITH: TO KHAMMONS(PHARMD) BY TCLEVELAND 06/04/2016 AT 5:04AM CORRECT CALL DATE 06/02/2016    Methicillin resistance NOT DETECTED NOT DETECTED Final   Streptococcus species NOT DETECTED NOT DETECTED Final   Streptococcus agalactiae NOT DETECTED NOT DETECTED Final   Streptococcus pneumoniae NOT DETECTED NOT DETECTED Final   Streptococcus pyogenes NOT DETECTED NOT DETECTED Final   Acinetobacter baumannii NOT DETECTED NOT DETECTED Final   Enterobacteriaceae species NOT DETECTED NOT DETECTED Final   Enterobacter cloacae complex NOT DETECTED NOT DETECTED Final   Escherichia coli NOT DETECTED NOT DETECTED Final   Klebsiella oxytoca NOT DETECTED NOT DETECTED Final   Klebsiella pneumoniae NOT DETECTED NOT DETECTED Final   Proteus species NOT DETECTED NOT DETECTED Final   Serratia marcescens NOT DETECTED NOT DETECTED Final   Haemophilus influenzae NOT DETECTED NOT DETECTED Final   Neisseria meningitidis NOT DETECTED NOT DETECTED Final   Pseudomonas aeruginosa NOT DETECTED NOT DETECTED Final   Candida albicans NOT DETECTED NOT DETECTED Final   Candida glabrata NOT DETECTED NOT DETECTED Final   Candida krusei NOT DETECTED NOT DETECTED Final   Candida parapsilosis NOT DETECTED NOT DETECTED Final   Candida tropicalis NOT DETECTED NOT DETECTED Final  Culture, blood (Routine X 2) w Reflex to ID Panel     Status: None   Collection Time: 05/30/16  9:41 AM  Result Value Ref Range Status   Specimen Description BLOOD LEFT ANTECUBITAL  Final   Special Requests BOTTLES DRAWN AEROBIC AND ANAEROBIC 8CC EA  Final   Culture NO GROWTH 5 DAYS  Final   Report Status 06/04/2016 FINAL  Final  Culture, blood (Routine X 2) w Reflex to ID Panel     Status: None (Preliminary result)   Collection Time: 06/03/16   1:46 PM  Result Value Ref Range Status   Specimen Description BLOOD RIGHT ARM  Final   Special Requests BOTTLES DRAWN AEROBIC ONLY  5 CC  Final   Culture NO GROWTH 3 DAYS  Final   Report Status PENDING  Incomplete  Culture, blood (Routine X 2) w Reflex to ID Panel     Status: Abnormal   Collection Time: 06/03/16  1:59 PM  Result Value Ref Range Status   Specimen Description BLOOD LEFT HAND  Final   Special Requests IN PEDIATRIC BOTTLE  1.0 CC  Final   Culture  Setup Time   Final    GRAM POSITIVE COCCI IN CLUSTERS AEROBIC BOTTLE ONLY CRITICAL RESULT CALLED TO, READ BACK BY AND VERIFIED WITH: [place] 06/04/16  A BROWNING    Culture STAPHYLOCOCCUS AUREUS (A)  Final   Report Status 06/06/2016 FINAL  Final   Organism ID, Bacteria STAPHYLOCOCCUS AUREUS  Final      Susceptibility   Staphylococcus aureus - MIC*    CIPROFLOXACIN <=0.5 SENSITIVE Sensitive     ERYTHROMYCIN <=0.25 SENSITIVE Sensitive     GENTAMICIN <=0.5 SENSITIVE Sensitive     OXACILLIN 0.5 SENSITIVE Sensitive     TETRACYCLINE <=1 SENSITIVE Sensitive     VANCOMYCIN <=0.5 SENSITIVE Sensitive     TRIMETH/SULFA <=10 SENSITIVE Sensitive     CLINDAMYCIN <=0.25 SENSITIVE Sensitive     RIFAMPIN <=0.5 SENSITIVE Sensitive     Inducible Clindamycin NEGATIVE Sensitive     * STAPHYLOCOCCUS AUREUS    Impression/Plan:  1. MSSA bacteremia - on cefazolin.  Repeat blood cultures positive, will repeat again tomorrow.   2.  Osteomyelitis - will continue on treatment for #1.  3.  Bilateral pulmonary opacities - will need follow up scan after antibiotic treatment

## 2016-06-07 LAB — BASIC METABOLIC PANEL
Anion gap: 8 (ref 5–15)
BUN: 67 mg/dL — ABNORMAL HIGH (ref 6–20)
CALCIUM: 8 mg/dL — AB (ref 8.9–10.3)
CHLORIDE: 104 mmol/L (ref 101–111)
CO2: 25 mmol/L (ref 22–32)
CREATININE: 1.89 mg/dL — AB (ref 0.44–1.00)
GFR calc Af Amer: 30 mL/min — ABNORMAL LOW (ref >60)
GFR calc non Af Amer: 26 mL/min — ABNORMAL LOW (ref >60)
Glucose, Bld: 173 mg/dL — ABNORMAL HIGH (ref 65–99)
Potassium: 4 mmol/L (ref 3.5–5.1)
Sodium: 137 mmol/L (ref 135–145)

## 2016-06-07 LAB — CBC
HCT: 22.6 % — ABNORMAL LOW (ref 36.0–46.0)
Hemoglobin: 7.2 g/dL — ABNORMAL LOW (ref 12.0–15.0)
MCH: 27.3 pg (ref 26.0–34.0)
MCHC: 31.9 g/dL (ref 30.0–36.0)
MCV: 85.6 fL (ref 78.0–100.0)
Platelets: 100 10*3/uL — ABNORMAL LOW (ref 150–400)
RBC: 2.64 MIL/uL — ABNORMAL LOW (ref 3.87–5.11)
RDW: 15.6 % — ABNORMAL HIGH (ref 11.5–15.5)
WBC: 35.6 10*3/uL — ABNORMAL HIGH (ref 4.0–10.5)

## 2016-06-07 LAB — GLUCOSE, CAPILLARY
GLUCOSE-CAPILLARY: 163 mg/dL — AB (ref 65–99)
GLUCOSE-CAPILLARY: 192 mg/dL — AB (ref 65–99)
GLUCOSE-CAPILLARY: 215 mg/dL — AB (ref 65–99)
Glucose-Capillary: 182 mg/dL — ABNORMAL HIGH (ref 65–99)
Glucose-Capillary: 256 mg/dL — ABNORMAL HIGH (ref 65–99)
Glucose-Capillary: 119 mg/dL — ABNORMAL HIGH (ref 65–99)

## 2016-06-07 NOTE — Progress Notes (Signed)
[facility] PROGRESS NOTE  Tracy Barker [id number] DOB: 1945-07-06 DOA: 05/27/2016 PCP: No primary care provider on file.  Assessment/Plan: 71 year old female with PMH of DM, HTN, and CLL.   She was recently been seen there for cough x 1 month treated with Zpak, and fall with no apparent injury. 1/9 she presented to [facility] for weakness x about 24 hours and several episodes of vomiting. -EMS was called and upon their arrival she was alert and oriented. In ED she was noted to additionally have some L sided weakness. Laboratory evaluation significant for hyperkalemia, renal failure, and leukocytosis. She was given broad spectrum antibiotics (mero and linezolid) and temporizing therapies for hyperkalemia were used. -Despite these therapies she became progressively more lethargic and developed shock. She was started on levophed with escalating doses and eventually was started on epinephrine as well.  She was transferred on 1/10 to [facility] for ICU admission. She was intubated for acute hypoxemic respiratory failure on arrival to [facility] ICU and received CVVHD for AKI.   MSSA bacteremia/septic shock. Requited mechanical ventilation, pressor support in ICU. extubated, off pressors -Negative TEE. Repeat blood culture 1/13 and 1/17 remained positive  + staph ? Source from foot-> Left foot x ray with osteomyelitis. Pend repeat blood cultures (1/21) -continue abx per ID, likely needs amputation of the left toe   Anemia. steady drop in Hb frm 10 to 6.9, received 1 unit PRBC. no blood loss noted therefore likely related to underlying infection. 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 ATN. She required CRRT while in ICU. Nephrology has signed off- if Cr stable today, d/c foley  Cavitary lung masses/ acute respiratory failure. Likely septic emboli due to bacteremia vs aspiration pneumonia with cavitations.  Received antibiotic treatment. no longer hypoxic. Recommended to repeat imaging to document resolution in few weeks   Thrombocytopenia. likely from infection. HIT panel negative. h/o CLL, CT abd/pelvis on presentation did not reveal liver or spleen abnormalities. platelets dropped to 30 - checked for DIC with INR/ PT/ PTT, Fibronogen, d dimer- no evidence of DIC. Cont monitor. Will TF as needed  Hypotension with h/o HTN. holding Catapres, Diltiazem, HCTZ, Ramipril -stress dose steroids have been tapered off CLL. chronically elevated WBC counts  Left sided toe wounds/ necrosis. ortho plans for "amputation of her 2nd toe with debridement of her other toe wounds vs amputation of the other toes " by the end of this week  PAF. she required amiodarone drip in ICU. currently NSR. diltiazem on hold due to hypotension.  ECHO mentioned below. check TSH -She has significant thrombocytopenia, not a candidate for anticoagulation.   Diabetes. poorly controlled, previously non-insulin dependent. A1c 10.3. Pt refusing insulin intermittently -Will need to discharge on insulin- started insulin teaching. On lantus 22U_ insulin novolog 3U TID+ ISS.   Morbid obesity. Body mass index is 33.27 kg/m.   Code Status: full Family Communication: d/w patient, rn (indicate person spoken with, relationship, and if by phone, the number) Disposition Plan: pend surgery    CConsultants:   Ortho  ID  PCCM  Nephrology  Procedures:  Intubation on 1/10>>1/12 R fem CVL 1/10 >>> Aline placement and removal TEE on 1/12 CVVHD  STUDIES: CT head 1/9> non-acute CT abdomen pelvis 1/9 > non-acute, gallbladder distention, colonic wall thickening CT chest 1/9 >bilateral opacities and cavitary lung masses  US Renal 1/11 >no evidence of hydronephrosis; 3 cm left adrenal mass  DG Left Foot >>osteomyelitis and septic arthritis  2 D ECHO >> Left ventricle: The cavity size was normal. Systolic function  was vigorous. The estimated ejection fraction was in the range of 65% to 70%. Wall motion was normal; there were no regional wall motion abnormalities. Doppler parameters are consistent with abnormal left ventricular relaxation (grade 1 diastolic dysfunction). - Ventricular septum: The contour showed diastolic flattening. - Mitral valve: Calcified annulus. - Right ventricle: The cavity size was severely dilated. Wall thickness was normal. Systolic function was mildly reduced. - Pulmonary arteries: Systolic pressure was severely increased. PA peak pressure: 71 mm Hg (S).  Antimicrobials:            Anti-infectives    Start     Dose/Rate Route Frequency Ordered Stop   06/05/16 1100  ceFAZolin (ANCEF) IVPB 2g/100 mL premix     2 g 200 mL/hr over 30 Minutes Intravenous Every 12 hours 06/05/16 1017     06/04/16 1115  cefTRIAXone (ROCEPHIN) injection 1 g  Status:  Discontinued     1 g Intramuscular Every 24 hours 06/04/16 1106 06/05/16 1013   06/04/16 1115  cefTRIAXone (ROCEPHIN) injection 1 g  Status:  Discontinued     1 g Intramuscular Every 24 hours 06/04/16 1106 06/05/16 1013   06/04/16 1100  cefTRIAXone (ROCEPHIN) injection 1 g  Status:  Discontinued     1 g Intramuscular Every 24 hours 06/04/16 0959 06/04/16 1104   06/04/16 1100  cefTRIAXone (ROCEPHIN) injection 1 g  Status:  Discontinued     1 g Intramuscular Every 24 hours 06/04/16 0959 06/04/16 1104   06/03/16 1800  ceFAZolin (ANCEF) injection 1.5 g  Status:  Discontinued     1.5 g Intramuscular Every 24 hours 06/03/16 1507 06/04/16 0955   06/03/16 1100  ceFAZolin (ANCEF) IVPB 1 g/50 mL premix  Status:  Discontinued     1 g 100 mL/hr over 30 Minutes Intravenous Every 12 hours 06/03/16 1015 06/03/16 1506   05/31/16 1400  ceFAZolin (ANCEF) IVPB 1 g/50 mL premix  Status:  Discontinued     1 g 100 mL/hr over 30 Minutes Intravenous Every 24 hours 05/31/16 1306 06/03/16 1015   05/28/16 1830   linezolid (ZYVOX) IVPB 600 mg  Status:  Discontinued     600 mg 300 mL/hr over 60 Minutes Intravenous Every 12 hours 05/28/16 1130 05/31/16 1235   05/28/16 0800  meropenem (MERREM) 500 mg in sodium chloride 0.9 % 50 mL IVPB  Status:  Discontinued     500 mg 100 mL/hr over 30 Minutes Intravenous Every 24 hours 05/27/16 0743 05/27/16 1645   05/27/16 2000  meropenem (MERREM) 1 g in sodium chloride 0.9 % 100 mL IVPB  Status:  Discontinued     1 g 200 mL/hr over 30 Minutes Intravenous Every 12 hours 05/27/16 1645 05/28/16 1130   05/27/16 0730  meropenem (MERREM) 1 g in sodium chloride 0.9 % 100 mL IVPB     1 g 200 mL/hr over 30 Minutes Intravenous  Once 05/27/16 0632 05/27/16 0808   05/27/16 0730  linezolid (ZYVOX) IVPB 600 mg  Status:  Discontinued     600 mg 300 mL/hr over 60 Minutes Intravenous Every 12 hours 05/27/16 0632 05/28/16 1056     HPI/Subjective: Alert, reports feeling better. Afebrile   Objective: Vitals:   06/06/16 2026 06/07/16 0445  BP: (!) 117/54 (!) 108/52  Pulse: 92 85  Resp: 18 17  Temp: 99.1 F (37.3 C) 98.4 F (36.9 C)  Intake/Output Summary (Last 24 hours) at 06/07/16 0903 Last data filed at 06/07/16 0600  Gross per 24 hour  Intake              840 ml  Output             1025 ml  Net             -185 ml   Filed Weights   06/06/16 0412 06/06/16 2026 06/07/16 0255  Weight: 73.5 kg (162 lb 0.6 oz) 73.5 kg (162 lb 0.6 oz) 73.5 kg (162 lb 0.6 oz)    Exam:   General:  No distress   Cardiovascular: s1,s2 rrr  Respiratory: few crackle RLL  Abdomen: soft, nt, nd   Musculoskeletal: pedal edema    Data Reviewed: Basic Metabolic Panel:  Recent Labs Lab 06/03/16 0826 06/04/16 0641 06/05/16 0741 06/06/16 1252 06/07/16 0349  NA 138 140 139 135 137  K 3.6 4.1 3.8 3.8 4.0  CL 104 107 105 103 104  CO2 21* 22 24 23 25   GLUCOSE 115* 99 178* 366* 173*  BUN 91* 97* 91* 79* 67*  CREATININE 3.26* 2.84* 2.50* 2.09* 1.89*  CALCIUM 8.3* 8.3*  8.4* 8.0* 8.0*   Liver Function Tests:  Recent Labs Lab 06/03/16 0621  AST 27  ALT 18  ALKPHOS 315*  BILITOT 0.9  PROT 3.6*  ALBUMIN 1.7*   No results for input(s): LIPASE, AMYLASE in the last 168 hours. No results for input(s): AMMONIA in the last 168 hours. CBC:  Recent Labs Lab 06/01/16 0300  06/03/16 1036 06/04/16 0641 06/05/16 0741 06/05/16 1615 06/06/16 1252 06/07/16 0349  WBC 28.6*  < > 48.8* 42.1* 45.7*  --  38.9* 35.6*  NEUTROABS 9.2*  --  12.2*  --   --   --   --   --   HGB 7.6*  < > 8.2* 7.1* 6.9* 7.5* 7.4* 7.2*  HCT 23.0*  < > 25.1* 22.3* 21.7* 23.5* 22.9* 22.6*  MCV 82.4  < > 83.4 83.8 84.1  --  85.4 85.6  PLT 40*  < > 30* 30* 46*  --  69* 100*  < > = values in this interval not displayed. Cardiac Enzymes: No results for input(s): CKTOTAL, CKMB, CKMBINDEX, TROPONINI in the last 168 hours. BNP (last 3 results) No results for input(s): BNP in the last 8760 hours.  ProBNP (last 3 results) No results for input(s): PROBNP in the last 8760 hours.  CBG:  Recent Labs Lab 06/06/16 1714 06/06/16 2015 06/07/16 0016 06/07/16 0406 06/07/16 0810  GLUCAP 257* 189* 192* 182* 163*    Recent Results (from the past 240 hour(s))  Culture, blood (routine x 2)     Status: Abnormal   Collection Time: 05/28/16 12:00 PM  Result Value Ref Range Status   Specimen Description BLOOD RIGHT HAND  Final   Special Requests IN PEDIATRIC BOTTLE  .[place]  Final   Culture  Setup Time   Final    GRAM POSITIVE COCCI IN CLUSTERS IN PEDIATRIC BOTTLE CRITICAL VALUE NOTED.  VALUE IS CONSISTENT WITH PREVIOUSLY REPORTED AND CALLED VALUE. CRITICAL RESULT CALLED TO, READ BACK BY AND VERIFIED WITH: CARON AMEND,PHARMD @0710  05/29/16 MKELLY,MLT    Culture (A)  Final    STAPHYLOCOCCUS AUREUS SUSCEPTIBILITIES PERFORMED ON PREVIOUS CULTURE WITHIN THE LAST 5 DAYS.    Report Status 05/30/2016 FINAL  Final  Culture, respiratory (NON-Expectorated)     Status: None   Collection Time:  05/28/16 12:58 PM  Result  Value Ref Range Status   Specimen Description TRACHEAL ASPIRATE  Final   Special Requests Normal  Final   Gram Stain   Final    ABUNDANT WBC PRESENT,BOTH PMN AND MONONUCLEAR FEW GRAM POSITIVE COCCI IN PAIRS FEW YEAST    Culture   Final    MODERATE STAPHYLOCOCCUS AUREUS MODERATE CANDIDA ALBICANS    Report Status 06/01/2016 FINAL  Final   Organism ID, Bacteria STAPHYLOCOCCUS AUREUS  Final      Susceptibility   Staphylococcus aureus - MIC*    CIPROFLOXACIN <=0.5 SENSITIVE Sensitive     ERYTHROMYCIN >=8 RESISTANT Resistant     GENTAMICIN <=0.5 SENSITIVE Sensitive     OXACILLIN 0.5 SENSITIVE Sensitive     TETRACYCLINE <=1 SENSITIVE Sensitive     VANCOMYCIN <=0.5 SENSITIVE Sensitive     TRIMETH/SULFA <=10 SENSITIVE Sensitive     CLINDAMYCIN <=0.25 SENSITIVE Sensitive     RIFAMPIN <=0.5 SENSITIVE Sensitive     Inducible Clindamycin NEGATIVE Sensitive     * MODERATE STAPHYLOCOCCUS AUREUS  Culture, blood (Routine X 2) w Reflex to ID Panel     Status: Abnormal   Collection Time: 05/30/16  9:33 AM  Result Value Ref Range Status   Specimen Description BLOOD RIGHT ANTECUBITAL  Final   Special Requests BOTTLES DRAWN AEROBIC AND ANAEROBIC 5CC EA  Final   Culture  Setup Time   Final    IN BOTH AEROBIC AND ANAEROBIC BOTTLES GRAM POSITIVE COCCI IN CLUSTERS CRITICAL RESULT CALLED TO, READ BACK BY AND VERIFIED WITH: TO KHAMMONS(PHRMD)BY TCLEVELAND 06/02/2016 AT 5:09AM    Culture (A)  Final    STAPHYLOCOCCUS AUREUS SUSCEPTIBILITIES PERFORMED ON PREVIOUS CULTURE WITHIN THE LAST 5 DAYS.    Report Status 06/04/2016 FINAL  Final  Blood Culture ID Panel (Reflexed)     Status: Abnormal   Collection Time: 05/30/16  9:33 AM  Result Value Ref Range Status   Enterococcus species NOT DETECTED NOT DETECTED Final   Listeria monocytogenes NOT DETECTED NOT DETECTED Final   Staphylococcus species DETECTED (A) NOT DETECTED Final    Comment: CRITICAL RESULT CALLED TO, READ BACK BY  AND VERIFIED WITH: TO KHAMMONS(PHARMD) BY TCLEVELAND 06/04/2016 AT 5:04AM CORRECT CALL DATE 06/02/2016    Staphylococcus aureus DETECTED (A) NOT DETECTED Final    Comment: CRITICAL RESULT CALLED TO, READ BACK BY AND VERIFIED WITH: TO KHAMMONS(PHARMD) BY TCLEVELAND 06/04/2016 AT 5:04AM CORRECT CALL DATE 06/02/2016    Methicillin resistance NOT DETECTED NOT DETECTED Final   Streptococcus species NOT DETECTED NOT DETECTED Final   Streptococcus agalactiae NOT DETECTED NOT DETECTED Final   Streptococcus pneumoniae NOT DETECTED NOT DETECTED Final   Streptococcus pyogenes NOT DETECTED NOT DETECTED Final   Acinetobacter baumannii NOT DETECTED NOT DETECTED Final   Enterobacteriaceae species NOT DETECTED NOT DETECTED Final   Enterobacter cloacae complex NOT DETECTED NOT DETECTED Final   Escherichia coli NOT DETECTED NOT DETECTED Final   Klebsiella oxytoca NOT DETECTED NOT DETECTED Final   Klebsiella pneumoniae NOT DETECTED NOT DETECTED Final   Proteus species NOT DETECTED NOT DETECTED Final   Serratia marcescens NOT DETECTED NOT DETECTED Final   Haemophilus influenzae NOT DETECTED NOT DETECTED Final   Neisseria meningitidis NOT DETECTED NOT DETECTED Final   Pseudomonas aeruginosa NOT DETECTED NOT DETECTED Final   Candida albicans NOT DETECTED NOT DETECTED Final   Candida glabrata NOT DETECTED NOT DETECTED Final   Candida krusei NOT DETECTED NOT DETECTED Final   Candida parapsilosis NOT DETECTED NOT DETECTED Final   Candida tropicalis NOT  DETECTED NOT DETECTED Final  Culture, blood (Routine X 2) w Reflex to ID Panel     Status: None   Collection Time: 05/30/16  9:41 AM  Result Value Ref Range Status   Specimen Description BLOOD LEFT ANTECUBITAL  Final   Special Requests BOTTLES DRAWN AEROBIC AND ANAEROBIC 8CC EA  Final   Culture NO GROWTH 5 DAYS  Final   Report Status 06/04/2016 FINAL  Final  Culture, blood (Routine X 2) w Reflex to ID Panel     Status: None (Preliminary result)   Collection  Time: 06/03/16  1:46 PM  Result Value Ref Range Status   Specimen Description BLOOD RIGHT ARM  Final   Special Requests BOTTLES DRAWN AEROBIC ONLY  5 CC  Final   Culture NO GROWTH 3 DAYS  Final   Report Status PENDING  Incomplete  Culture, blood (Routine X 2) w Reflex to ID Panel     Status: Abnormal   Collection Time: 06/03/16  1:59 PM  Result Value Ref Range Status   Specimen Description BLOOD LEFT HAND  Final   Special Requests IN PEDIATRIC BOTTLE  1.0 CC  Final   Culture  Setup Time   Final    GRAM POSITIVE COCCI IN CLUSTERS AEROBIC BOTTLE ONLY CRITICAL RESULT CALLED TO, READ BACK BY AND VERIFIED WITH: Jerel Shepherd PHARMD 1929 06/04/16 A BROWNING    Culture STAPHYLOCOCCUS AUREUS (A)  Final   Report Status 06/06/2016 FINAL  Final   Organism ID, Bacteria STAPHYLOCOCCUS AUREUS  Final      Susceptibility   Staphylococcus aureus - MIC*    CIPROFLOXACIN <=0.5 SENSITIVE Sensitive     ERYTHROMYCIN <=0.25 SENSITIVE Sensitive     GENTAMICIN <=0.5 SENSITIVE Sensitive     OXACILLIN 0.5 SENSITIVE Sensitive     TETRACYCLINE <=1 SENSITIVE Sensitive     VANCOMYCIN <=0.5 SENSITIVE Sensitive     TRIMETH/SULFA <=10 SENSITIVE Sensitive     CLINDAMYCIN <=0.25 SENSITIVE Sensitive     RIFAMPIN <=0.5 SENSITIVE Sensitive     Inducible Clindamycin NEGATIVE Sensitive     * STAPHYLOCOCCUS AUREUS  Culture, blood (routine x 2)     Status: None (Preliminary result)   Collection Time: 06/07/16  4:50 AM  Result Value Ref Range Status   Specimen Description BLOOD LEFT ARM  Final   Special Requests BOTTLES DRAWN AEROBIC ONLY 7ML  Final   Culture PENDING  Incomplete   Report Status PENDING  Incomplete     Studies: No results found.  Scheduled Meds: .  ceFAZolin (ANCEF) IV  2 g Intravenous Q12H  . feeding supplement (NEPRO CARB STEADY)  237 mL Oral BID BM  . insulin aspart  0-20 Units Subcutaneous TID WC  . insulin aspart  3 Units Subcutaneous TID WC  . insulin glargine  22 Units Subcutaneous Q24H  .  mouth rinse  15 mL Mouth Rinse BID   Continuous Infusions:  Principal Problem:   Staphylococcus aureus bacteremia with sepsis (HCC) Active Problems:   Shock ([place])   Abdominal pain   Acute renal failure (ARF) (HCC)   Increased anion gap metabolic acidosis   Cavitary pneumonia   Skin ulcer of toe of left foot, limited to breakdown of skin ([place])   Acute respiratory failure with hypoxia (HCC)   Acute osteomyelitis of left ankle or foot ([place])    Time spent: >35 minutes     Kinnie Feil  [facility] Pager [phone]. If 7PM-7AM, please contact night-coverage at [email or web address], password [facility] 06/07/2016, 9:03  AM  LOS: 11 days

## 2016-06-08 LAB — BASIC METABOLIC PANEL
Anion gap: 8 (ref 5–15)
BUN: 60 mg/dL — AB (ref 6–20)
CHLORIDE: 104 mmol/L (ref 101–111)
CO2: 25 mmol/L (ref 22–32)
CREATININE: 1.63 mg/dL — AB (ref 0.44–1.00)
Calcium: 7.8 mg/dL — ABNORMAL LOW (ref 8.9–10.3)
GFR calc Af Amer: 36 mL/min — ABNORMAL LOW (ref >60)
GFR calc non Af Amer: 31 mL/min — ABNORMAL LOW (ref >60)
Glucose, Bld: 241 mg/dL — ABNORMAL HIGH (ref 65–99)
Potassium: 3.8 mmol/L (ref 3.5–5.1)
SODIUM: 137 mmol/L (ref 135–145)

## 2016-06-08 LAB — GLUCOSE, CAPILLARY
Glucose-Capillary: 189 mg/dL — ABNORMAL HIGH (ref 65–99)
Glucose-Capillary: 208 mg/dL — ABNORMAL HIGH (ref 65–99)
Glucose-Capillary: 234 mg/dL — ABNORMAL HIGH (ref 65–99)
Glucose-Capillary: 245 mg/dL — ABNORMAL HIGH (ref 65–99)
Glucose-Capillary: 274 mg/dL — ABNORMAL HIGH (ref 65–99)
Glucose-Capillary: 307 mg/dL — ABNORMAL HIGH (ref 65–99)

## 2016-06-08 LAB — CULTURE, BLOOD (ROUTINE X 2): Culture: NO GROWTH

## 2016-06-08 LAB — CBC
HEMATOCRIT: 21.1 % — AB (ref 36.0–46.0)
Hemoglobin: 6.6 g/dL — CL (ref 12.0–15.0)
MCH: 27 pg (ref 26.0–34.0)
MCHC: 31.3 g/dL (ref 30.0–36.0)
MCV: 86.5 fL (ref 78.0–100.0)
Platelets: 144 10*3/uL — ABNORMAL LOW (ref 150–400)
RBC: 2.44 MIL/uL — ABNORMAL LOW (ref 3.87–5.11)
RDW: 15.5 % (ref 11.5–15.5)
WBC: 26.3 10*3/uL — ABNORMAL HIGH (ref 4.0–10.5)

## 2016-06-08 LAB — TSH: TSH: 4.554 u[IU]/mL — AB (ref 0.350–4.500)

## 2016-06-08 LAB — HEMOGLOBIN AND HEMATOCRIT, BLOOD
HEMATOCRIT: 26.2 % — AB (ref 36.0–46.0)
Hemoglobin: 8.5 g/dL — ABNORMAL LOW (ref 12.0–15.0)

## 2016-06-08 LAB — PREPARE RBC (CROSSMATCH)

## 2016-06-08 MED ORDER — CEFAZOLIN SODIUM-DEXTROSE 2-4 GM/100ML-% IV SOLN
2.0000 g | Freq: Three times a day (TID) | INTRAVENOUS | Status: DC
  Administered 2016-06-08 – 2016-06-11 (×10): 2 g via INTRAVENOUS
  Filled 2016-06-08 (×12): qty 100

## 2016-06-08 MED ORDER — PRO-STAT SUGAR FREE PO LIQD
30.0000 mL | Freq: Every day | ORAL | Status: DC
  Administered 2016-06-08 – 2016-06-10 (×3): 30 mL via ORAL
  Filled 2016-06-08 (×3): qty 30

## 2016-06-08 MED ORDER — SODIUM CHLORIDE 0.9 % IV SOLN: Freq: Once | INTRAVENOUS | Status: DC

## 2016-06-08 MED ORDER — INSULIN GLARGINE 100 UNIT/ML ~~LOC~~ SOLN
25.0000 [IU] | SUBCUTANEOUS | Status: DC
  Administered 2016-06-08: 25 [IU] via SUBCUTANEOUS
  Filled 2016-06-08: qty 0.25

## 2016-06-08 NOTE — Progress Notes (Signed)
Critical called to [place]. MD informed. Orders received. Spoke to lab personnel regarding the need for type and screen order. Sent a message to Dr. Hilbert Bible. Awaiting order.

## 2016-06-08 NOTE — Progress Notes (Signed)
Paged MD about HGB being 6.6. Awaiting response.   Eleanora Neighbor, RN

## 2016-06-08 NOTE — Progress Notes (Signed)
Nutrition Follow-up  DOCUMENTATION CODES:   Obesity unspecified  INTERVENTION:  Continue Nepro Shake po BID, each supplement provides 425 kcal and 19 grams protein.  Provide 30 ml Prostat po once daily, each supplement provides 100 kcal and 15 grams of protein.   Encourage adequate PO intake.   NUTRITION DIAGNOSIS:   Inadequate oral intake related to inability to eat as evidenced by NPO status; diet advanced; poor po; ongoing  GOAL:   Patient will meet greater than or equal to 90% of their needs; progressing  MONITOR:   PO intake, Supplement acceptance, Labs, Weight trends, Skin, I & O's  REASON FOR ASSESSMENT:   Rounds, Consult Enteral/tube feeding initiation and management  ASSESSMENT:   71 yo female with hx of CLL, poorly controlled DM, admitted w/ refractory shock/MODS as well as left sided weakness. Working dx is sepsis +/- new stroke. Pt off HD. TDC has been removed. Repeat blood culture 1/13 and 1/17 remained positive  + staph ? Source from foot-> Left foot x ray with osteomyelitis. Per MD, likely needs amputation of the left toe.  Meal completion has been varied from 10-100% however mostly intake has been poor. Pt reports appetite has been decreased. Pt currently has Nepro Shake ordered and has been consuming them. RD to continue with current orders. RD to additionally order Prostat to aid in protein needs. Pt educated on the importance of adequate caloric and protein intake. Pt expressed understanding. RD to continue to monitor. Labs and medications reviewed.    Diet Order:  Diet Carb Modified Fluid consistency: Thin; Room service appropriate? Yes  Skin:  Wound (see comment) (Stg1 bilat heel,stg2 L hip,unstg L toe ulcer, wound coccyx)  Last BM:  1/20  Height:   Ht Readings from Last 1 Encounters:  06/03/16 5\' 4"  (1.626 m)    Weight:   Wt Readings from Last 1 Encounters:  06/07/16 161 lb 9.6 oz (73.3 kg)    Ideal Body Weight:  50 kg  BMI:  Body mass  index is 27.74 kg/m.  Estimated Nutritional Needs:   Kcal:  1800-2000  Protein:  100-110 grams  Fluid:  Per MD  EDUCATION NEEDS:   No education needs identified at this time  Corrin Parker, MS, RD, LDN Pager # [phone] After hours/ weekend pager # [phone]

## 2016-06-08 NOTE — Progress Notes (Signed)
Pharmacy Antibiotic Note  Tracy Barker is a 71 y.o. female admitted on 05/27/2016 with MSSA bacteremia and cavitary PNA.  Pharmacy has been consulted for cefazolin dosing. Patient has Zosyn allergy listed and after further discussion with the patient she reports that she got a rash with Zosyn. She does state that she has tolerated cephalexin, a first-generation cephalosporin in the past.   SCr continues to trend down- this morning 1.63 with CrCl ~30-40mL/min. Repeat blood cultures drawn yesterday are still pending  Plan: - Cefazolin 2g IV q8h with improvement in renal function - Will renal function, new culture results, ortho and ID plans  Height: 5\' 4"  (162.6 cm) Weight: 161 lb 9.6 oz (73.3 kg) IBW/kg (Calculated) : 54.7  Temp (24hrs), Avg:98.6 F (37 C), Min:98.3 F (36.8 C), Max:98.8 F (37.1 C)   Recent Labs Lab 06/04/16 0641 06/05/16 0741 06/06/16 1252 06/07/16 0349 06/08/16 0415  WBC 42.1* 45.7* 38.9* 35.6* 26.3*  CREATININE 2.84* 2.50* 2.09* 1.89* 1.63*    Estimated Creatinine Clearance: 31.5 mL/min (by C-G formula based on SCr of 1.63 mg/dL (H)).    Allergies  Allergen Reactions  . Zosyn [Piperacillin Sod-Tazobactam So] Rash    Patient reports she has tolerated cephalexin in the past.  . Amlodipine   . Clindamycin/Lincomycin     1/10 Zyvox>> 1/14 1/10 Meropenem>>1/11 1/14 Ancef >> 1/17; 1/19 >>  1/18 Rocephin (short term until IV access res-established) >> 1/19   1/10 Resp panel: Rhino/Enterovirus 1/10 BCx: MSSA 1/10 urine: 40K yeast 1/10 resp: Few GPC in pairs, Few yeast (reincubated) 1/10 MRSA PCR neg 1/11 Trach asp: Mod staph aureus, mod C.albicans 1/11 BCx: 2/2 MSSA 1/13 BCx: 1/2 MSSA 1/17 BCx: 1/2 GPC clusters  Thank you for allowing pharmacy to be a part of this patient's care.  Isabel Freese D. [place], PharmD, BCPS Clinical Pharmacist Pager: [phone] 06/08/2016 8:45 AM

## 2016-06-08 NOTE — Progress Notes (Signed)
[facility] PROGRESS NOTE  Randilyn Krigbaum [id number] DOB: Oct 07, 1945 DOA: 05/27/2016 PCP: No primary care provider on file.  Assessment/Plan: 71 year old female with PMH of DM, HTN, and CLL.   She was recently been seen there for cough x 1 month treated with Zpak, and fall with no apparent injury. 1/9 she presented to [facility] for weakness x about 24 hours and several episodes of vomiting. -EMS was called and upon their arrival she was alert and oriented. In ED she was noted to additionally have some L sided weakness. Laboratory evaluation significant for hyperkalemia, renal failure, and leukocytosis. She was given broad spectrum antibiotics (mero and linezolid) and temporizing therapies for hyperkalemia were used. -Despite these therapies she became progressively more lethargic and developed shock with mssa bacteremia. She was started on levophed with escalating doses and eventually was started on epinephrine as well.  She was transferred on 1/10 to [facility] for ICU admission. She was intubated for acute hypoxemic respiratory failure on arrival to [place] ICU and received CVVHD for AKI.   MSSA bacteremia/septic shock. Requited mechanical ventilation, pressor support in ICU. extubated, off pressors -Negative TEE. Repeat blood culture 1/13 and 1/17 remained positive  + staph ? Source from foot-> Left foot x ray with osteomyelitis. Pend repeat blood cultures (1/21) -continue abx per ID, likely needs amputation of the left toe. Ortho is following    Anemia. On admission steady drop in Hb frm 10 to 6.9, received 1 unit PRBC. no blood loss noted therefore likely related to underlying infection. 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 No acute bleeding. Will TF 1 unit. Monitor   AKI/ CKD III. ATN. She required CRRT while in ICU. Nephrology has signed off- if Cr stable today, d/c foley  Cavitary lung  masses/ acute respiratory failure. Likely septic emboli due to bacteremia vs aspiration pneumonia with cavitations. Received antibiotic treatment. no longer hypoxic. Recommended to repeat imaging to document resolution in few weeks   Thrombocytopenia. likely from infection. HIT panel negative. h/o CLL, CT abd/pelvis on presentation did not reveal liver or spleen abnormalities. platelets dropped to 30 - checked for DIC with INR/ PT/ PTT, Fibronogen, d dimer- no evidence of DIC. Cont monitor. Will TF as needed  Hypotension with h/o HTN. holding Catapres, Diltiazem, HCTZ, Ramipril -stress dose steroids have been tapered off CLL. chronically elevated WBC counts  Left sided toe wounds/ necrosis. ortho plans for "amputation of her 2nd toe with debridement of her other toe wounds vs amputation of the other toes " by the end of this week  PAF. she required amiodarone drip in ICU. currently NSR. diltiazem on hold due to hypotension.  ECHO mentioned below. check TSH -She has significant thrombocytopenia, not a candidate for anticoagulation.   Diabetes. poorly controlled, previously non-insulin dependent. A1c 10.3. Pt refusing insulin intermittently -Will need to discharge on insulin- started insulin teaching. On lantus 22U_ insulin novolog 3U TID+ ISS.   Morbid obesity. Body mass index is 33.27 kg/m.   Code Status: full Family Communication: d/w patient, rn (indicate person spoken with, relationship, and if by phone, the number) Disposition Plan: pend surgery    CConsultants:   Ortho  ID  PCCM  Nephrology  Procedures:  Intubation on 1/10>>1/12 R fem CVL 1/10 >>> Aline placement and removal TEE on 1/12 CVVHD  STUDIES: CT head 1/9> non-acute CT abdomen pelvis 1/9 > non-acute, gallbladder distention, colonic wall thickening CT chest  1/9 >bilateral opacities and cavitary lung masses  US Renal 1/11 >no evidence of hydronephrosis; 3 cm left adrenal mass  DG Left Foot  >>osteomyelitis and septic arthritis  2 D ECHO >> Left ventricle: The cavity size was normal. Systolic function was vigorous. The estimated ejection fraction was in the range of 65% to 70%. Wall motion was normal; there were no regional wall motion abnormalities. Doppler parameters are consistent with abnormal left ventricular relaxation (grade 1 diastolic dysfunction). - Ventricular septum: The contour showed diastolic flattening. - Mitral valve: Calcified annulus. - Right ventricle: The cavity size was severely dilated. Wall thickness was normal. Systolic function was mildly reduced. - Pulmonary arteries: Systolic pressure was severely increased. PA peak pressure: 71 mm Hg (S).  Antimicrobials:            Anti-infectives    Start     Dose/Rate Route Frequency Ordered Stop   06/05/16 1100  ceFAZolin (ANCEF) IVPB 2g/100 mL premix     2 g 200 mL/hr over 30 Minutes Intravenous Every 12 hours 06/05/16 1017     06/04/16 1115  cefTRIAXone (ROCEPHIN) injection 1 g  Status:  Discontinued     1 g Intramuscular Every 24 hours 06/04/16 1106 06/05/16 1013   06/04/16 1115  cefTRIAXone (ROCEPHIN) injection 1 g  Status:  Discontinued     1 g Intramuscular Every 24 hours 06/04/16 1106 06/05/16 1013   06/04/16 1100  cefTRIAXone (ROCEPHIN) injection 1 g  Status:  Discontinued     1 g Intramuscular Every 24 hours 06/04/16 0959 06/04/16 1104   06/04/16 1100  cefTRIAXone (ROCEPHIN) injection 1 g  Status:  Discontinued     1 g Intramuscular Every 24 hours 06/04/16 0959 06/04/16 1104   06/03/16 1800  ceFAZolin (ANCEF) injection 1.5 g  Status:  Discontinued     1.5 g Intramuscular Every 24 hours 06/03/16 1507 06/04/16 0955   06/03/16 1100  ceFAZolin (ANCEF) IVPB 1 g/50 mL premix  Status:  Discontinued     1 g 100 mL/hr over 30 Minutes Intravenous Every 12 hours 06/03/16 1015 06/03/16 1506   05/31/16 1400  ceFAZolin (ANCEF) IVPB 1 g/50 mL premix  Status:  Discontinued      1 g 100 mL/hr over 30 Minutes Intravenous Every 24 hours 05/31/16 1306 06/03/16 1015   05/28/16 1830  linezolid (ZYVOX) IVPB 600 mg  Status:  Discontinued     600 mg 300 mL/hr over 60 Minutes Intravenous Every 12 hours 05/28/16 1130 05/31/16 1235   05/28/16 0800  meropenem (MERREM) 500 mg in sodium chloride 0.9 % 50 mL IVPB  Status:  Discontinued     500 mg 100 mL/hr over 30 Minutes Intravenous Every 24 hours 05/27/16 0743 05/27/16 1645   05/27/16 2000  meropenem (MERREM) 1 g in sodium chloride 0.9 % 100 mL IVPB  Status:  Discontinued     1 g 200 mL/hr over 30 Minutes Intravenous Every 12 hours 05/27/16 1645 05/28/16 1130   05/27/16 0730  meropenem (MERREM) 1 g in sodium chloride 0.9 % 100 mL IVPB     1 g 200 mL/hr over 30 Minutes Intravenous  Once 05/27/16 0632 05/27/16 0808   05/27/16 0730  linezolid (ZYVOX) IVPB 600 mg  Status:  Discontinued     600 mg 300 mL/hr over 60 Minutes Intravenous Every 12 hours 05/27/16 0632 05/28/16 1056     HPI/Subjective: Alert, reports feeling better. Afebrile   Objective: Vitals:   06/07/16 2109 06/08/16 0449  BP: (!) 96/44 Marland Kitchen)  103/42  Pulse: 94 89  Resp: 17 18  Temp: 98.8 F (37.1 C) 98.3 F (36.8 C)    Intake/Output Summary (Last 24 hours) at 06/08/16 0744 Last data filed at 06/08/16 0600  Gross per 24 hour  Intake             1110 ml  Output              325 ml  Net              785 ml   Filed Weights   06/06/16 2026 06/07/16 0255 06/07/16 2109  Weight: 73.5 kg (162 lb 0.6 oz) 73.5 kg (162 lb 0.6 oz) 73.3 kg (161 lb 9.6 oz)    Exam:   General:  No distress   Cardiovascular: s1,s2 rrr  Respiratory: few crackle RLL  Abdomen: soft, nt, nd   Musculoskeletal: pedal edema    Data Reviewed: Basic Metabolic Panel:  Recent Labs Lab 06/04/16 0641 06/05/16 0741 06/06/16 1252 06/07/16 0349 06/08/16 0415  NA 140 139 135 137 137  K 4.1 3.8 3.8 4.0 3.8  CL 107 105 103 104 104  CO2 22 24 23 25 25   GLUCOSE 99 178*  366* 173* 241*  BUN 97* 91* 79* 67* 60*  CREATININE 2.84* 2.50* 2.09* 1.89* 1.63*  CALCIUM 8.3* 8.4* 8.0* 8.0* 7.8*   Liver Function Tests:  Recent Labs Lab 06/03/16 0621  AST 27  ALT 18  ALKPHOS 315*  BILITOT 0.9  PROT 3.6*  ALBUMIN 1.7*   No results for input(s): LIPASE, AMYLASE in the last 168 hours. No results for input(s): AMMONIA in the last 168 hours. CBC:  Recent Labs Lab 06/03/16 1036 06/04/16 0641 06/05/16 0741 06/05/16 1615 06/06/16 1252 06/07/16 0349 06/08/16 0415  WBC 48.8* 42.1* 45.7*  --  38.9* 35.6* 26.3*  NEUTROABS 12.2*  --   --   --   --   --   --   HGB 8.2* 7.1* 6.9* 7.5* 7.4* 7.2* 6.6*  HCT 25.1* 22.3* 21.7* 23.5* 22.9* 22.6* 21.1*  MCV 83.4 83.8 84.1  --  85.4 85.6 86.5  PLT 30* 30* 46*  --  69* 100* 144*   Cardiac Enzymes: No results for input(s): CKTOTAL, CKMB, CKMBINDEX, TROPONINI in the last 168 hours. BNP (last 3 results) No results for input(s): BNP in the last 8760 hours.  ProBNP (last 3 results) No results for input(s): PROBNP in the last 8760 hours.  CBG:  Recent Labs Lab 06/07/16 1656 06/07/16 2032 06/08/16 0030 06/08/16 0418 06/08/16 0742  GLUCAP 256* 119* 189* 234* 208*    Recent Results (from the past 240 hour(s))  Culture, blood (Routine X 2) w Reflex to ID Panel     Status: Abnormal   Collection Time: 05/30/16  9:33 AM  Result Value Ref Range Status   Specimen Description BLOOD RIGHT ANTECUBITAL  Final   Special Requests BOTTLES DRAWN AEROBIC AND ANAEROBIC 5CC EA  Final   Culture  Setup Time   Final    IN BOTH AEROBIC AND ANAEROBIC BOTTLES GRAM POSITIVE COCCI IN CLUSTERS CRITICAL RESULT CALLED TO, READ BACK BY AND VERIFIED WITH: TO KHAMMONS(PHRMD)BY TCLEVELAND 06/02/2016 AT 5:09AM    Culture (A)  Final    STAPHYLOCOCCUS AUREUS SUSCEPTIBILITIES PERFORMED ON PREVIOUS CULTURE WITHIN THE LAST 5 DAYS.    Report Status 06/04/2016 FINAL  Final  Blood Culture ID Panel (Reflexed)     Status: Abnormal   Collection  Time: 05/30/16  9:33 AM  Result  Value Ref Range Status   Enterococcus species NOT DETECTED NOT DETECTED Final   Listeria monocytogenes NOT DETECTED NOT DETECTED Final   Staphylococcus species DETECTED (A) NOT DETECTED Final    Comment: CRITICAL RESULT CALLED TO, READ BACK BY AND VERIFIED WITH: TO KHAMMONS(PHARMD) BY TCLEVELAND 06/04/2016 AT 5:04AM CORRECT CALL DATE 06/02/2016    Staphylococcus aureus DETECTED (A) NOT DETECTED Final    Comment: CRITICAL RESULT CALLED TO, READ BACK BY AND VERIFIED WITH: TO KHAMMONS(PHARMD) BY TCLEVELAND 06/04/2016 AT 5:04AM CORRECT CALL DATE 06/02/2016    Methicillin resistance NOT DETECTED NOT DETECTED Final   Streptococcus species NOT DETECTED NOT DETECTED Final   Streptococcus agalactiae NOT DETECTED NOT DETECTED Final   Streptococcus pneumoniae NOT DETECTED NOT DETECTED Final   Streptococcus pyogenes NOT DETECTED NOT DETECTED Final   Acinetobacter baumannii NOT DETECTED NOT DETECTED Final   Enterobacteriaceae species NOT DETECTED NOT DETECTED Final   Enterobacter cloacae complex NOT DETECTED NOT DETECTED Final   Escherichia coli NOT DETECTED NOT DETECTED Final   Klebsiella oxytoca NOT DETECTED NOT DETECTED Final   Klebsiella pneumoniae NOT DETECTED NOT DETECTED Final   Proteus species NOT DETECTED NOT DETECTED Final   Serratia marcescens NOT DETECTED NOT DETECTED Final   Haemophilus influenzae NOT DETECTED NOT DETECTED Final   Neisseria meningitidis NOT DETECTED NOT DETECTED Final   Pseudomonas aeruginosa NOT DETECTED NOT DETECTED Final   Candida albicans NOT DETECTED NOT DETECTED Final   Candida glabrata NOT DETECTED NOT DETECTED Final   Candida krusei NOT DETECTED NOT DETECTED Final   Candida parapsilosis NOT DETECTED NOT DETECTED Final   Candida tropicalis NOT DETECTED NOT DETECTED Final  Culture, blood (Routine X 2) w Reflex to ID Panel     Status: None   Collection Time: 05/30/16  9:41 AM  Result Value Ref Range Status   Specimen Description  BLOOD LEFT ANTECUBITAL  Final   Special Requests BOTTLES DRAWN AEROBIC AND ANAEROBIC 8CC EA  Final   Culture NO GROWTH 5 DAYS  Final   Report Status 06/04/2016 FINAL  Final  Culture, blood (Routine X 2) w Reflex to ID Panel     Status: None (Preliminary result)   Collection Time: 06/03/16  1:46 PM  Result Value Ref Range Status   Specimen Description BLOOD RIGHT ARM  Final   Special Requests BOTTLES DRAWN AEROBIC ONLY  5 CC  Final   Culture NO GROWTH 4 DAYS  Final   Report Status PENDING  Incomplete  Culture, blood (Routine X 2) w Reflex to ID Panel     Status: Abnormal   Collection Time: 06/03/16  1:59 PM  Result Value Ref Range Status   Specimen Description BLOOD LEFT HAND  Final   Special Requests IN PEDIATRIC BOTTLE  1.0 CC  Final   Culture  Setup Time   Final    GRAM POSITIVE COCCI IN CLUSTERS AEROBIC BOTTLE ONLY CRITICAL RESULT CALLED TO, READ BACK BY AND VERIFIED WITHJerel Shepherd PHARMD 1929 06/04/16 A BROWNING    Culture STAPHYLOCOCCUS AUREUS (A)  Final   Report Status 06/06/2016 FINAL  Final   Organism ID, Bacteria STAPHYLOCOCCUS AUREUS  Final      Susceptibility   Staphylococcus aureus - MIC*    CIPROFLOXACIN <=0.5 SENSITIVE Sensitive     ERYTHROMYCIN <=0.25 SENSITIVE Sensitive     GENTAMICIN <=0.5 SENSITIVE Sensitive     OXACILLIN 0.5 SENSITIVE Sensitive     TETRACYCLINE <=1 SENSITIVE Sensitive     VANCOMYCIN <=0.5 SENSITIVE Sensitive  TRIMETH/SULFA <=10 SENSITIVE Sensitive     CLINDAMYCIN <=0.25 SENSITIVE Sensitive     RIFAMPIN <=0.5 SENSITIVE Sensitive     Inducible Clindamycin NEGATIVE Sensitive     * STAPHYLOCOCCUS AUREUS  Culture, blood (routine x 2)     Status: None (Preliminary result)   Collection Time: 06/07/16  4:50 AM  Result Value Ref Range Status   Specimen Description BLOOD LEFT ARM  Final   Special Requests BOTTLES DRAWN AEROBIC ONLY 7ML  Final   Culture PENDING  Incomplete   Report Status PENDING  Incomplete     Studies: No results  found.  Scheduled Meds: . sodium chloride   Intravenous Once  .  ceFAZolin (ANCEF) IV  2 g Intravenous Q12H  . feeding supplement (NEPRO CARB STEADY)  237 mL Oral BID BM  . insulin aspart  0-20 Units Subcutaneous TID WC  . insulin aspart  3 Units Subcutaneous TID WC  . insulin glargine  22 Units Subcutaneous Q24H  . mouth rinse  15 mL Mouth Rinse BID   Continuous Infusions:  Principal Problem:   Staphylococcus aureus bacteremia with sepsis (HCC) Active Problems:   Shock ([place])   Abdominal pain   Acute renal failure (ARF) (HCC)   Increased anion gap metabolic acidosis   Cavitary pneumonia   Skin ulcer of toe of left foot, limited to breakdown of skin ([place])   Acute respiratory failure with hypoxia (HCC)   Acute osteomyelitis of left ankle or foot ([place])    Time spent: >35 minutes     Kinnie Feil  [facility] Pager [phone]. If 7PM-7AM, please contact night-coverage at [email or web address], password [facility] 06/08/2016, 7:44 AM  LOS: 12 days

## 2016-06-09 DIAGNOSIS — I2690 Septic pulmonary embolism without acute cor pulmonale: Secondary | ICD-10-CM

## 2016-06-09 LAB — TYPE AND SCREEN
Blood Product Expiration Date: 201801262359
ISSUE DATE / TIME: 201801220856
Unit Type and Rh: 9500

## 2016-06-09 LAB — GLUCOSE, CAPILLARY
GLUCOSE-CAPILLARY: 215 mg/dL — AB (ref 65–99)
GLUCOSE-CAPILLARY: 192 mg/dL — AB (ref 65–99)
GLUCOSE-CAPILLARY: 242 mg/dL — AB (ref 65–99)
GLUCOSE-CAPILLARY: 235 mg/dL — AB (ref 65–99)
Glucose-Capillary: 195 mg/dL — ABNORMAL HIGH (ref 65–99)
Glucose-Capillary: 255 mg/dL — ABNORMAL HIGH (ref 65–99)

## 2016-06-09 LAB — CBC
HEMATOCRIT: 24.6 % — AB (ref 36.0–46.0)
HEMOGLOBIN: 7.9 g/dL — AB (ref 12.0–15.0)
MCH: 27.5 pg (ref 26.0–34.0)
MCHC: 32.1 g/dL (ref 30.0–36.0)
MCV: 85.7 fL (ref 78.0–100.0)
Platelets: 151 10*3/uL (ref 150–400)
RBC: 2.87 MIL/uL — ABNORMAL LOW (ref 3.87–5.11)
RDW: 16.4 % — ABNORMAL HIGH (ref 11.5–15.5)
WBC: 27.9 10*3/uL — AB (ref 4.0–10.5)

## 2016-06-09 MED ORDER — INSULIN GLARGINE 100 UNIT/ML ~~LOC~~ SOLN
28.0000 [IU] | SUBCUTANEOUS | Status: DC
  Administered 2016-06-09 – 2016-06-10 (×2): 28 [IU] via SUBCUTANEOUS
  Filled 2016-06-09 (×2): qty 0.28

## 2016-06-09 NOTE — Progress Notes (Signed)
Inpatient Diabetes Program Recommendations  AACE/ADA: New Consensus Statement on Inpatient Glycemic Control (2015)  Target Ranges:  Prepandial:   less than 140 mg/dL      Peak postprandial:   less than 180 mg/dL (1-2 hours)      Critically ill patients:  140 - 180 mg/dL   Review of Glycemic Control  Diabetes history: DM 2 Outpatient Diabetes medications: Januvia 100 mg Daily, Metformin 1000 mg BID Current orders for Inpatient glycemic control: Lantus 28 units (increased thi am), Novolog  Resistant TID + Novolog 3 units TID meal coverage  Inpatient Diabetes Program Recommendations:  Glucose increased from 192 to 255 mg/dl after Novolog correction and meal coverage this am. Please consider increasing meal coverage to Novolog 6 units.  Thanks,  Tama Headings RN, MSN, [facility] Inpatient Diabetes Coordinator Team Pager [phone] (8a-5p)

## 2016-06-09 NOTE — Progress Notes (Signed)
[facility] for Infectious Disease   Reason for visit: Follow up on MSSA bacteremia, osteomyelitis  Interval History: no changes, afebrile, WBC still up to 27 but better from previous; patient complains of not being comfortable  Physical Exam: Constitutional:  Vitals:   06/08/16 2239 06/09/16 0440  BP: (!) 97/56 (!) 91/42  Pulse: 84 87  Resp: 16 18  Temp: 99.5 F (37.5 C) 98.8 F (37.1 C)   patient appears in NAD Respiratory: Normal respiratory effort; CTA B Cardiovascular: RRR GI: soft, nt MS: left toe with stable ulcer, no drainage  Review of Systems: Constitutional: negative for fevers and chills Gastrointestinal: negative for diarrhea Integument/breast: negative for rash  Lab Results  Component Value Date   WBC 27.9 (H) 06/09/2016   HGB 7.9 (L) 06/09/2016   HCT 24.6 (L) 06/09/2016   MCV 85.7 06/09/2016   PLT 151 06/09/2016    Lab Results  Component Value Date   CREATININE 1.63 (H) 06/08/2016   BUN 60 (H) 06/08/2016   NA 137 06/08/2016   K 3.8 06/08/2016   CL 104 06/08/2016   CO2 25 06/08/2016    Lab Results  Component Value Date   ALT 18 06/03/2016   AST 27 06/03/2016   ALKPHOS 315 (H) 06/03/2016     Microbiology: Recent Results (from the past 240 hour(s))  Culture, blood (Routine X 2) w Reflex to ID Panel     Status: None   Collection Time: 06/03/16  1:46 PM  Result Value Ref Range Status   Specimen Description BLOOD RIGHT ARM  Final   Special Requests BOTTLES DRAWN AEROBIC ONLY  5 CC  Final   Culture NO GROWTH 5 DAYS  Final   Report Status 06/08/2016 FINAL  Final  Culture, blood (Routine X 2) w Reflex to ID Panel     Status: Abnormal   Collection Time: 06/03/16  1:59 PM  Result Value Ref Range Status   Specimen Description BLOOD LEFT HAND  Final   Special Requests IN PEDIATRIC BOTTLE  1.0 CC  Final   Culture  Setup Time   Final    GRAM POSITIVE COCCI IN CLUSTERS AEROBIC BOTTLE ONLY CRITICAL RESULT CALLED TO, READ BACK BY AND VERIFIED  WITH: Jerel Shepherd PHARMD 1929 06/04/16 A BROWNING    Culture STAPHYLOCOCCUS AUREUS (A)  Final   Report Status 06/06/2016 FINAL  Final   Organism ID, Bacteria STAPHYLOCOCCUS AUREUS  Final      Susceptibility   Staphylococcus aureus - MIC*    CIPROFLOXACIN <=0.5 SENSITIVE Sensitive     ERYTHROMYCIN <=0.25 SENSITIVE Sensitive     GENTAMICIN <=0.5 SENSITIVE Sensitive     OXACILLIN 0.5 SENSITIVE Sensitive     TETRACYCLINE <=1 SENSITIVE Sensitive     VANCOMYCIN <=0.5 SENSITIVE Sensitive     TRIMETH/SULFA <=10 SENSITIVE Sensitive     CLINDAMYCIN <=0.25 SENSITIVE Sensitive     RIFAMPIN <=0.5 SENSITIVE Sensitive     Inducible Clindamycin NEGATIVE Sensitive     * STAPHYLOCOCCUS AUREUS  Culture, blood (routine x 2)     Status: None (Preliminary result)   Collection Time: 06/07/16  4:50 AM  Result Value Ref Range Status   Specimen Description BLOOD LEFT ARM  Final   Special Requests BOTTLES DRAWN AEROBIC ONLY 7ML  Final   Culture NO GROWTH 1 DAY  Final   Report Status PENDING  Incomplete  Culture, blood (routine x 2)     Status: None (Preliminary result)   Collection Time: 06/07/16  5:05  AM  Result Value Ref Range Status   Specimen Description BLOOD LEFT HAND  Final   Special Requests IN PEDIATRIC BOTTLE 3ML  Final   Culture NO GROWTH 1 DAY  Final   Report Status PENDING  Incomplete    Impression/Plan:  1. MSSA bacteremia - has IJ line.  Repeat blood cultures from 1/21 remain ngtd.  With persistently positive blood cultures, septic emboli, will need 6 weeks of IV cefazolin from 1/21 culture through March 3 Antibiotics per home health protocol Ok to pull picc line at the end of treatment. Will initiate opat order set   2. Chronic Osteomyelitis - ? Amputation.  Per Dr. Ninfa Linden.  Otherwise antibiotics as above.    3. Bilateral pulmonary opacities - TEE negative.  ? If IE or not that dislodged.  Treatment as per #1.    I will follow up intermittently

## 2016-06-09 NOTE — Clinical Social Work Note (Addendum)
On 1/22, CSW received call from [facility] with [facility] at [facility] in [place] requesting updated clinicals on patient. Clinicals transmitted to facility on 1/23. Call made to [facility] [phone]) and message left for admissions director Adonis Brook to call CSW. Patient will need ST rehab at discharge and lives in [place], [place].  Laportia Carley Givens, MSW, LCSW Licensed Clinical Social Worker [place] [phone]

## 2016-06-09 NOTE — Progress Notes (Signed)
Occupational Therapy Treatment Patient Details Name: Tracy Barker MRN: [id number] DOB: May 04, 1946 Today's Date: 06/09/2016    History of present illness This 71 y.o. female transferred from [facility] ED with septic shock  due to staph areus thought to be due to osteomyelitis of Lt second toe .  Pt was intubated 05/27/16 >>05/29/16.  PMH includes:  CLL, DM, HTN, and h/o falls per H&P she was a "frequent flyer" in ED typically for falls     OT comments  Pt making slow, but steady progress towards OT goals - continue plan of care for now. Pt mainly limited by generalized weakness and fatigue. According to orthopedic Dr notes, surgeon will follow closely and possible surgery in the future.    Follow Up Recommendations  SNF;Supervision/Assistance - 24 hour    Equipment Recommendations   (defer to next venue)    Recommendations for Other Services  None at this time  Precautions / Restrictions Precautions Precautions: Fall Precaution Comments: IV Required Braces or Orthoses: Other Brace/Splint Other Brace/Splint: LLE post op boot Restrictions Weight Bearing Restrictions: No    Mobility Bed Mobility Overal bed mobility: Needs Assistance Bed Mobility: Rolling;Supine to Sit Rolling: Mod assist   Supine to sit: Mod assist     General bed mobility comments: Pt required assistance and management of BLEs. Pt also required assistance with ascend of trunk. Pt took increased time and required cueing for sequencing and technique.   Transfers Overall transfer level: Needs assistance Equipment used: None;2 person hand held assist Transfers: Sit to/from [facility] Sit to Stand: Mod assist;+2 safety/equipment Stand pivot transfers: Mod assist;+2 safety/equipment       General transfer comment: Cues for sequencing and technique. Pt fatigued quickly.     Balance Overall balance assessment: Needs assistance Sitting-balance support: Bilateral upper extremity supported;Feet  supported Sitting balance-Leahy Scale: Fair     Standing balance support: No upper extremity supported;During functional activity Standing balance-Leahy Scale: Poor   ADL Overall ADL's : Needs assistance/impaired Eating/Feeding: Set up;Bed level   Grooming: Set up;Sitting   Upper Body Bathing: Moderate assistance;Sitting   Lower Body Bathing: Total assistance;Sit to/from stand   Upper Body Dressing : Sitting;Maximal assistance Upper Body Dressing Details (indicate cue type and reason): gown Lower Body Dressing: Total assistance;Sit to/from stand   Toilet Transfer: Minimal assistance;+2 for safety/equipment Toilet Transfer Details (indicate cue type and reason): simulated sit<>stand and stand pivot EOB to chair Toileting- Clothing Manipulation and Hygiene: Total assistance;Bed level         General ADL Comments: Pt with urine incontinence. Therapist had pt work on peri cleansing, but she required total assist for this. CNA present to assist with +2 for overall safety during transfers. Post op shoe donned for sit to/from stands from bed.            Cognition   Behavior During Therapy: WFL for tasks assessed/performed Overall Cognitive Status: Within Functional Limits for tasks assessed   General Comments: pt with hx of falls      Exercises General Exercises - Upper Extremity Elbow Flexion: AAROM;Right;10 reps;Seated Elbow Extension: AAROM;Both;10 reps;Seated           Pertinent Vitals/ Pain       Pain Assessment: Faces Faces Pain Scale: Hurts little more Pain Location: "all over" Pain Descriptors / Indicators: Aching;Sore Pain Intervention(s): Monitored during session;Repositioned  Frequency  Min 2X/week     Progress Toward Goals  OT Goals(current goals can now be found in the care plan section)  Progress  towards OT goals: Progressing toward goals  Acute Rehab OT Goals Patient Stated Goal: none stated OT Goal Formulation: With patient Time For Goal  Achievement: 06/13/16 Potential to Achieve Goals: Good  Plan Discharge plan remains appropriate    End of Session Equipment Utilized During Treatment: Other (comment) (post op boot to LLE)   Activity Tolerance Patient tolerated treatment well;Patient limited by fatigue   Patient Left in chair;with call bell/phone within reach;with chair alarm set;with nursing/sitter in room   Nurse Communication Mobility status     Time: [id number] OT Time Calculation (min): 23 min  Charges: OT General Charges $OT Visit: 1 Procedure OT Treatments $Self Care/Home Management : 8-22 mins $Therapeutic Activity: 8-22 mins  Chrys Racer , MS, OTR/L, CLT Pager: [phone] 06/09/2016, 11:01 AM

## 2016-06-09 NOTE — Progress Notes (Signed)
[facility] PROGRESS NOTE  Tracy Barker [id number] DOB: [birth year] DOA: 05/27/2016 PCP: No primary care provider on file.  Assessment/Plan: 71 year old female with PMH of DM, HTN, and CLL.   She was recently been seen there for cough x 1 month treated with Zpak, and fall with no apparent injury. 1/9 she presented to [facility] for weakness x about 24 hours and several episodes of vomiting. -EMS was called and upon their arrival she was alert and oriented. In ED she was noted to additionally have some L sided weakness. Laboratory evaluation significant for hyperkalemia, renal failure, and leukocytosis. She was given broad spectrum antibiotics (mero and linezolid) and temporizing therapies for hyperkalemia were used. -Despite these therapies she became progressively more lethargic and developed shock with mssa bacteremia. She was started on levophed with escalating doses and eventually was started on epinephrine as well.  She was transferred on 1/10 to [facility] for ICU admission. She was intubated for acute hypoxemic respiratory failure on arrival to  ICU and received CVVHD for AKI. She was extubated and transferred to the floor with slow improvement.    MSSA bacteremia/septic shock. Requited mechanical ventilation, pressor support in ICU. extubated, off pressors -Negative TEE. Repeat blood culture 1/13 and 1/17 remained positive  + staph ? Source from foot-> Left foot x ray with osteomyelitis vs pnuemonia.  -Repeat blood cultures (1/21): neg day 1. continue abx per ID, likely needs amputation of the left toe. Ortho is following    Anemia. On admission steady drop in Hb frm 10 to 6.9, received 1 unit PRBC. no blood loss noted therefore likely related to underlying infection. 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 No acute bleeding. Received 1 unit of PRBC. Monitor   AKI/ CKD III. ATN. She  required CRRT while in ICU. Nephrology has signed off- if Cr stable today, d/c foley  Cavitary lung masses/ acute respiratory failure. Likely septic emboli due to bacteremia vs aspiration pneumonia with cavitations. Received antibiotic treatment. no longer hypoxic. Recommended to repeat imaging to document resolution in few weeks   Thrombocytopenia. likely from infection. HIT panel negative. h/o CLL, CT abd/pelvis on presentation did not reveal liver or spleen abnormalities. platelets dropped to 30 - checked for DIC with INR/ PT/ PTT, Fibronogen, d dimer- no evidence of DIC. -platelet count is improving, cont monitor. Will TF as needed  Hypotension with h/o HTN. holding Catapres, Diltiazem, HCTZ, Ramipril -stress dose steroids have been tapered off CLL. chronically elevated WBC counts  Left sided toe wounds/ necrosis. ortho plans for "amputation of her 2nd toe with debridement of her other toe wounds vs amputation of the other toes " by the end of this week  PAF. she required amiodarone drip in ICU. currently NSR. diltiazem on hold due to hypotension.  ECHO mentioned below.  TSH-4.5 -She has significant thrombocytopenia, not a candidate for anticoagulation.   Diabetes. poorly controlled, previously non-insulin dependent. A1c 10.3. Pt refusing insulin intermittently -Will need to discharge on insulin- started insulin teaching. On lantus 28U (increased on 1/23)+ insulin novolog 3U TID+ ISS.   Morbid obesity. Body mass index is 33.27 kg/m.   Code Status: full Family Communication: d/w patient, rn (indicate person spoken with, relationship, and if by phone, the number) Disposition Plan: pend surgery, blood cultures, needs PT eval     CConsultants:   Ortho  ID  PCCM  Nephrology  Procedures:  Intubation on 1/10>>1/12 R  fem CVL 1/10 >>> Aline placement and removal TEE on 1/12 CVVHD  STUDIES: CT head 1/9> non-acute CT abdomen pelvis 1/9 > non-acute, gallbladder  distention, colonic wall thickening CT chest 1/9 >bilateral opacities and cavitary lung masses  US Renal 1/11 >no evidence of hydronephrosis; 3 cm left adrenal mass  DG Left Foot >>osteomyelitis and septic arthritis  2 D ECHO >> Left ventricle: The cavity size was normal. Systolic function was vigorous. The estimated ejection fraction was in the range of 65% to 70%. Wall motion was normal; there were no regional wall motion abnormalities. Doppler parameters are consistent with abnormal left ventricular relaxation (grade 1 diastolic dysfunction). - Ventricular septum: The contour showed diastolic flattening. - Mitral valve: Calcified annulus. - Right ventricle: The cavity size was severely dilated. Wall thickness was normal. Systolic function was mildly reduced. - Pulmonary arteries: Systolic pressure was severely increased. PA peak pressure: 71 mm Hg (S).  Antimicrobials:            Anti-infectives    Start     Dose/Rate Route Frequency Ordered Stop   06/05/16 1100  ceFAZolin (ANCEF) IVPB 2g/100 mL premix     2 g 200 mL/hr over 30 Minutes Intravenous Every 12 hours 06/05/16 1017     06/04/16 1115  cefTRIAXone (ROCEPHIN) injection 1 g  Status:  Discontinued     1 g Intramuscular Every 24 hours 06/04/16 1106 06/05/16 1013   06/04/16 1115  cefTRIAXone (ROCEPHIN) injection 1 g  Status:  Discontinued     1 g Intramuscular Every 24 hours 06/04/16 1106 06/05/16 1013   06/04/16 1100  cefTRIAXone (ROCEPHIN) injection 1 g  Status:  Discontinued     1 g Intramuscular Every 24 hours 06/04/16 0959 06/04/16 1104   06/04/16 1100  cefTRIAXone (ROCEPHIN) injection 1 g  Status:  Discontinued     1 g Intramuscular Every 24 hours 06/04/16 0959 06/04/16 1104   06/03/16 1800  ceFAZolin (ANCEF) injection 1.5 g  Status:  Discontinued     1.5 g Intramuscular Every 24 hours 06/03/16 1507 06/04/16 0955   06/03/16 1100  ceFAZolin (ANCEF) IVPB 1 g/50 mL premix  Status:   Discontinued     1 g 100 mL/hr over 30 Minutes Intravenous Every 12 hours 06/03/16 1015 06/03/16 1506   05/31/16 1400  ceFAZolin (ANCEF) IVPB 1 g/50 mL premix  Status:  Discontinued     1 g 100 mL/hr over 30 Minutes Intravenous Every 24 hours 05/31/16 1306 06/03/16 1015   05/28/16 1830  linezolid (ZYVOX) IVPB 600 mg  Status:  Discontinued     600 mg 300 mL/hr over 60 Minutes Intravenous Every 12 hours 05/28/16 1130 05/31/16 1235   05/28/16 0800  meropenem (MERREM) 500 mg in sodium chloride 0.9 % 50 mL IVPB  Status:  Discontinued     500 mg 100 mL/hr over 30 Minutes Intravenous Every 24 hours 05/27/16 0743 05/27/16 1645   05/27/16 2000  meropenem (MERREM) 1 g in sodium chloride 0.9 % 100 mL IVPB  Status:  Discontinued     1 g 200 mL/hr over 30 Minutes Intravenous Every 12 hours 05/27/16 1645 05/28/16 1130   05/27/16 0730  meropenem (MERREM) 1 g in sodium chloride 0.9 % 100 mL IVPB     1 g 200 mL/hr over 30 Minutes Intravenous  Once 05/27/16 0632 05/27/16 0808   05/27/16 0730  linezolid (ZYVOX) IVPB 600 mg  Status:  Discontinued     600 mg 300 mL/hr over 60 Minutes Intravenous  Every 12 hours 05/27/16 0632 05/28/16 1056     HPI/Subjective: Alert, reports feeling better. Afebrile. Asking for water. Denies acute pains, no shortness of breath, no diarrhea.     Objective: Vitals:   06/08/16 2239 06/09/16 0440  BP: (!) 97/56 (!) 91/42  Pulse: 84 87  Resp: 16 18  Temp: 99.5 F (37.5 C) 98.8 F (37.1 C)    Intake/Output Summary (Last 24 hours) at 06/09/16 0752 Last data filed at 06/09/16 [id number]  Gross per 24 hour  Intake             1220 ml  Output              200 ml  Net             1020 ml   Filed Weights   06/06/16 2026 06/07/16 0255 06/07/16 2109  Weight: 73.5 kg (162 lb 0.6 oz) 73.5 kg (162 lb 0.6 oz) 73.3 kg (161 lb 9.6 oz)    Exam:   General:  No distress   Cardiovascular: s1,s2 rrr  Respiratory: few crackle RLL  Abdomen: soft, nt, nd    Musculoskeletal: pedal edema    Data Reviewed: Basic Metabolic Panel:  Recent Labs Lab 06/04/16 0641 06/05/16 0741 06/06/16 1252 06/07/16 0349 06/08/16 0415  NA 140 139 135 137 137  K 4.1 3.8 3.8 4.0 3.8  CL 107 105 103 104 104  CO2 22 24 23 25 25   GLUCOSE 99 178* 366* 173* 241*  BUN 97* 91* 79* 67* 60*  CREATININE 2.84* 2.50* 2.09* 1.89* 1.63*  CALCIUM 8.3* 8.4* 8.0* 8.0* 7.8*   Liver Function Tests:  Recent Labs Lab 06/03/16 0621  AST 27  ALT 18  ALKPHOS 315*  BILITOT 0.9  PROT 3.6*  ALBUMIN 1.7*   No results for input(s): LIPASE, AMYLASE in the last 168 hours. No results for input(s): AMMONIA in the last 168 hours. CBC:  Recent Labs Lab 06/03/16 1036  06/05/16 0741  06/06/16 1252 06/07/16 0349 06/08/16 0415 06/08/16 1500 06/09/16 0409  WBC 48.8*  < > 45.7*  --  38.9* 35.6* 26.3*  --  27.9*  NEUTROABS 12.2*  --   --   --   --   --   --   --   --   HGB 8.2*  < > 6.9*  < > 7.4* 7.2* 6.6* 8.5* 7.9*  HCT 25.1*  < > 21.7*  < > 22.9* 22.6* 21.1* 26.2* 24.6*  MCV 83.4  < > 84.1  --  85.4 85.6 86.5  --  85.7  PLT 30*  < > 46*  --  69* 100* 144*  --  151  < > = values in this interval not displayed. Cardiac Enzymes: No results for input(s): CKTOTAL, CKMB, CKMBINDEX, TROPONINI in the last 168 hours. BNP (last 3 results) No results for input(s): BNP in the last 8760 hours.  ProBNP (last 3 results) No results for input(s): PROBNP in the last 8760 hours.  CBG:  Recent Labs Lab 06/08/16 1225 06/08/16 1648 06/08/16 2008 06/09/16 0153 06/09/16 0439  GLUCAP 307* 274* 245* 195* 215*    Recent Results (from the past 240 hour(s))  Culture, blood (Routine X 2) w Reflex to ID Panel     Status: Abnormal   Collection Time: 05/30/16  9:33 AM  Result Value Ref Range Status   Specimen Description BLOOD RIGHT ANTECUBITAL  Final   Special Requests BOTTLES DRAWN AEROBIC AND ANAEROBIC 5CC EA  Final  Culture  Setup Time   Final    IN BOTH AEROBIC AND ANAEROBIC  BOTTLES GRAM POSITIVE COCCI IN CLUSTERS CRITICAL RESULT CALLED TO, READ BACK BY AND VERIFIED WITH: TO KHAMMONS(PHRMD)BY TCLEVELAND 06/02/2016 AT 5:09AM    Culture (A)  Final    STAPHYLOCOCCUS AUREUS SUSCEPTIBILITIES PERFORMED ON PREVIOUS CULTURE WITHIN THE LAST 5 DAYS.    Report Status 06/04/2016 FINAL  Final  Blood Culture ID Panel (Reflexed)     Status: Abnormal   Collection Time: 05/30/16  9:33 AM  Result Value Ref Range Status   Enterococcus species NOT DETECTED NOT DETECTED Final   Listeria monocytogenes NOT DETECTED NOT DETECTED Final   Staphylococcus species DETECTED (A) NOT DETECTED Final    Comment: CRITICAL RESULT CALLED TO, READ BACK BY AND VERIFIED WITH: TO KHAMMONS(PHARMD) BY TCLEVELAND 06/04/2016 AT 5:04AM CORRECT CALL DATE 06/02/2016    Staphylococcus aureus DETECTED (A) NOT DETECTED Final    Comment: CRITICAL RESULT CALLED TO, READ BACK BY AND VERIFIED WITH: TO KHAMMONS(PHARMD) BY TCLEVELAND 06/04/2016 AT 5:04AM CORRECT CALL DATE 06/02/2016    Methicillin resistance NOT DETECTED NOT DETECTED Final   Streptococcus species NOT DETECTED NOT DETECTED Final   Streptococcus agalactiae NOT DETECTED NOT DETECTED Final   Streptococcus pneumoniae NOT DETECTED NOT DETECTED Final   Streptococcus pyogenes NOT DETECTED NOT DETECTED Final   Acinetobacter baumannii NOT DETECTED NOT DETECTED Final   Enterobacteriaceae species NOT DETECTED NOT DETECTED Final   Enterobacter cloacae complex NOT DETECTED NOT DETECTED Final   Escherichia coli NOT DETECTED NOT DETECTED Final   Klebsiella oxytoca NOT DETECTED NOT DETECTED Final   Klebsiella pneumoniae NOT DETECTED NOT DETECTED Final   Proteus species NOT DETECTED NOT DETECTED Final   Serratia marcescens NOT DETECTED NOT DETECTED Final   Haemophilus influenzae NOT DETECTED NOT DETECTED Final   Neisseria meningitidis NOT DETECTED NOT DETECTED Final   Pseudomonas aeruginosa NOT DETECTED NOT DETECTED Final   Candida albicans NOT DETECTED NOT  DETECTED Final   Candida glabrata NOT DETECTED NOT DETECTED Final   Candida krusei NOT DETECTED NOT DETECTED Final   Candida parapsilosis NOT DETECTED NOT DETECTED Final   Candida tropicalis NOT DETECTED NOT DETECTED Final  Culture, blood (Routine X 2) w Reflex to ID Panel     Status: None   Collection Time: 05/30/16  9:41 AM  Result Value Ref Range Status   Specimen Description BLOOD LEFT ANTECUBITAL  Final   Special Requests BOTTLES DRAWN AEROBIC AND ANAEROBIC 8CC EA  Final   Culture NO GROWTH 5 DAYS  Final   Report Status 06/04/2016 FINAL  Final  Culture, blood (Routine X 2) w Reflex to ID Panel     Status: None   Collection Time: 06/03/16  1:46 PM  Result Value Ref Range Status   Specimen Description BLOOD RIGHT ARM  Final   Special Requests BOTTLES DRAWN AEROBIC ONLY  5 CC  Final   Culture NO GROWTH 5 DAYS  Final   Report Status 06/08/2016 FINAL  Final  Culture, blood (Routine X 2) w Reflex to ID Panel     Status: Abnormal   Collection Time: 06/03/16  1:59 PM  Result Value Ref Range Status   Specimen Description BLOOD LEFT HAND  Final   Special Requests IN PEDIATRIC BOTTLE  1.0 CC  Final   Culture  Setup Time   Final    GRAM POSITIVE COCCI IN CLUSTERS AEROBIC BOTTLE ONLY CRITICAL RESULT CALLED TO, READ BACK BY AND VERIFIED WITH: Coleta  06/04/16 A BROWNING    Culture STAPHYLOCOCCUS AUREUS (A)  Final   Report Status 06/06/2016 FINAL  Final   Organism ID, Bacteria STAPHYLOCOCCUS AUREUS  Final      Susceptibility   Staphylococcus aureus - MIC*    CIPROFLOXACIN <=0.5 SENSITIVE Sensitive     ERYTHROMYCIN <=0.25 SENSITIVE Sensitive     GENTAMICIN <=0.5 SENSITIVE Sensitive     OXACILLIN 0.5 SENSITIVE Sensitive     TETRACYCLINE <=1 SENSITIVE Sensitive     VANCOMYCIN <=0.5 SENSITIVE Sensitive     TRIMETH/SULFA <=10 SENSITIVE Sensitive     CLINDAMYCIN <=0.25 SENSITIVE Sensitive     RIFAMPIN <=0.5 SENSITIVE Sensitive     Inducible Clindamycin NEGATIVE Sensitive     *  STAPHYLOCOCCUS AUREUS  Culture, blood (routine x 2)     Status: None (Preliminary result)   Collection Time: 06/07/16  4:50 AM  Result Value Ref Range Status   Specimen Description BLOOD LEFT ARM  Final   Special Requests BOTTLES DRAWN AEROBIC ONLY 7ML  Final   Culture NO GROWTH 1 DAY  Final   Report Status PENDING  Incomplete  Culture, blood (routine x 2)     Status: None (Preliminary result)   Collection Time: 06/07/16  5:05 AM  Result Value Ref Range Status   Specimen Description BLOOD LEFT HAND  Final   Special Requests IN PEDIATRIC BOTTLE 3ML  Final   Culture NO GROWTH 1 DAY  Final   Report Status PENDING  Incomplete     Studies: No results found.  Scheduled Meds: . sodium chloride   Intravenous Once  .  ceFAZolin (ANCEF) IV  2 g Intravenous Q8H  . feeding supplement (NEPRO CARB STEADY)  237 mL Oral BID BM  . feeding supplement (PRO-STAT SUGAR FREE 64)  30 mL Oral Daily  . insulin aspart  0-20 Units Subcutaneous TID WC  . insulin aspart  3 Units Subcutaneous TID WC  . insulin glargine  25 Units Subcutaneous Q24H  . mouth rinse  15 mL Mouth Rinse BID   Continuous Infusions:  Principal Problem:   Staphylococcus aureus bacteremia with sepsis (HCC) Active Problems:   Shock ([place])   Abdominal pain   Acute renal failure (ARF) (HCC)   Increased anion gap metabolic acidosis   Cavitary pneumonia   Skin ulcer of toe of left foot, limited to breakdown of skin ([place])   Acute respiratory failure with hypoxia (HCC)   Acute osteomyelitis of left ankle or foot ([place])    Time spent: >35 minutes     Kinnie Feil  [facility] Pager [phone]. If 7PM-7AM, please contact night-coverage at [email or web address], password [facility] 06/09/2016, 7:52 AM  LOS: 13 days

## 2016-06-09 NOTE — Progress Notes (Signed)
Physical Therapy Treatment Patient Details Name: Tracy Barker MRN: [id number] DOB: 08-22-1945 Today's Date: 06/09/2016    History of Present Illness This 71 y.o. female transferred from [facility] ED with septic shock  due to staph areus thought to be due to osteomyelitis of Lt second toe .  Pt was intubated 05/27/16 >>05/29/16.  PMH includes:  CLL, DM, HTN, and h/o falls per H&P she was a "frequent flyer" in ED typically for falls      PT Comments    Pt progressing slowly with general mobility and gait stability.  Talked with pt about taking some ownership for getting up with staff and rolling around to change positions in the bed without staff asking her to reposition.  Follow Up Recommendations  SNF;Supervision/Assistance - 24 hour     Equipment Recommendations  None recommended by PT    Recommendations for Other Services       Precautions / Restrictions Precautions Precautions: Fall Precaution Comments: IV Required Braces or Orthoses: Other Brace/Splint Other Brace/Splint: LLE post op boot    Mobility  Bed Mobility Overal bed mobility: Needs Assistance Bed Mobility: Supine to Sit Rolling: Mod assist   Supine to sit: Mod assist Sit to supine: Mod assist   General bed mobility comments: help to get up onto R elbow and stability assist to get up to full sit.  Transfers Overall transfer level: Needs assistance Equipment used: Rolling walker (2 wheeled) Transfers: Sit to/from [facility] Sit to Stand: Mod assist;+2 safety/equipment Stand pivot transfers: Mod assist;+2 safety/equipment       General transfer comment: cues for hand placemen/sequencing  Ambulation/Gait Ambulation/Gait assistance: Min assist;+2 safety/equipment Ambulation Distance (Feet): 70 Feet (then 30 and finally 45 back to BR) Assistive device: Rolling walker (2 wheeled) Gait Pattern/deviations: Step-through pattern Gait velocity: slower   General Gait Details: wide BOS, variable  distance from RW.  Visible fatigue and degrading of gait.   Stairs            Wheelchair Mobility    Modified Rankin (Stroke Patients Only)       Balance Overall balance assessment: Needs assistance   Sitting balance-Leahy Scale: Fair       Standing balance-Leahy Scale: Poor Standing balance comment: still reliant on the RW                    Cognition Arousal/Alertness: Awake/alert Behavior During Therapy: WFL for tasks assessed/performed Overall Cognitive Status: Within Functional Limits for tasks assessed                 General Comments: pt with hx of falls    Exercises Other Exercises Other Exercises: hip/knee flex/ext with graded resistance bil x10 Other Exercises: bicep/tricep presses bil x10 reps    General Comments General comments (skin integrity, edema, etc.): Sats in the mid 90's on RA with activity      Pertinent Vitals/Pain Pain Assessment: Faces Faces Pain Scale: Hurts little more Pain Location: all over, bottom Pain Descriptors / Indicators: Aching;Sore Pain Intervention(s): Monitored during session;Repositioned    Home Living                      Prior Function            PT Goals (current goals can now be found in the care plan section) Acute Rehab PT Goals Patient Stated Goal: none stated PT Goal Formulation: With patient Time For Goal Achievement: 06/15/16 Potential to Achieve Goals: Good Progress towards  PT goals: Progressing toward goals    Frequency    Min 3X/week      PT Plan Current plan remains appropriate    Co-evaluation             End of Session Equipment Utilized During Treatment: Gait belt Activity Tolerance: Patient tolerated treatment well Patient left: in bed;with call bell/phone within reach;with bed alarm set     Time: [id number] PT Time Calculation (min) (ACUTE ONLY): 49 min  Charges:  $Gait Training: 8-22 mins $Therapeutic Exercise: 8-22 mins $Therapeutic  Activity: 8-22 mins                    G CodesTessie Fass Kala Ambriz 06/09/2016, 5:02 PM 06/09/2016  Donnella Sham, PT [phone]  (pager)

## 2016-06-09 NOTE — Care Management Important Message (Signed)
Important Message  Patient Details  Name: Tracy Barker MRN: [id number] Date of Birth: 07-Jul-1945   Medicare Important Message Given:  Yes    [name] 06/09/2016, 12:00 PM

## 2016-06-10 ENCOUNTER — Inpatient Hospital Stay (HOSPITAL_COMMUNITY): Payer: Medicare Other

## 2016-06-10 ENCOUNTER — Encounter (HOSPITAL_COMMUNITY): Payer: Self-pay | Admitting: Interventional Radiology

## 2016-06-10 DIAGNOSIS — E1142 Type 2 diabetes mellitus with diabetic polyneuropathy: Secondary | ICD-10-CM

## 2016-06-10 DIAGNOSIS — E43 Unspecified severe protein-calorie malnutrition: Secondary | ICD-10-CM

## 2016-06-10 DIAGNOSIS — M86672 Other chronic osteomyelitis, left ankle and foot: Secondary | ICD-10-CM

## 2016-06-10 HISTORY — PX: IR GENERIC HISTORICAL: IMG1180011

## 2016-06-10 LAB — GLUCOSE, CAPILLARY
GLUCOSE-CAPILLARY: 170 mg/dL — AB (ref 65–99)
GLUCOSE-CAPILLARY: 174 mg/dL — AB (ref 65–99)
Glucose-Capillary: 179 mg/dL — ABNORMAL HIGH (ref 65–99)
Glucose-Capillary: 184 mg/dL — ABNORMAL HIGH (ref 65–99)
Glucose-Capillary: 195 mg/dL — ABNORMAL HIGH (ref 65–99)
Glucose-Capillary: 202 mg/dL — ABNORMAL HIGH (ref 65–99)
Glucose-Capillary: 245 mg/dL — ABNORMAL HIGH (ref 65–99)

## 2016-06-10 LAB — URIC ACID: URIC ACID, SERUM: 6.3 mg/dL (ref 2.3–6.6)

## 2016-06-10 LAB — CBC
HCT: 26 % — ABNORMAL LOW (ref 36.0–46.0)
Hemoglobin: 8.1 g/dL — ABNORMAL LOW (ref 12.0–15.0)
MCH: 26.9 pg (ref 26.0–34.0)
MCHC: 31.2 g/dL (ref 30.0–36.0)
MCV: 86.4 fL (ref 78.0–100.0)
PLATELETS: 199 10*3/uL (ref 150–400)
RBC: 3.01 MIL/uL — ABNORMAL LOW (ref 3.87–5.11)
RDW: 16.1 % — AB (ref 11.5–15.5)
WBC: 24.8 10*3/uL — AB (ref 4.0–10.5)

## 2016-06-10 LAB — BASIC METABOLIC PANEL
ANION GAP: 9 (ref 5–15)
BUN: 55 mg/dL — ABNORMAL HIGH (ref 6–20)
CALCIUM: 8.1 mg/dL — AB (ref 8.9–10.3)
CO2: 26 mmol/L (ref 22–32)
CREATININE: 1.56 mg/dL — AB (ref 0.44–1.00)
Chloride: 104 mmol/L (ref 101–111)
GFR, EST AFRICAN AMERICAN: 38 mL/min — AB (ref >60)
GFR, EST NON AFRICAN AMERICAN: 33 mL/min — AB (ref >60)
Glucose, Bld: 191 mg/dL — ABNORMAL HIGH (ref 65–99)
Potassium: 3.6 mmol/L (ref 3.5–5.1)
Sodium: 139 mmol/L (ref 135–145)

## 2016-06-10 MED ORDER — PRO-STAT SUGAR FREE PO LIQD: 30.0000 mL | Freq: Every day | ORAL | 0 refills | Status: DC

## 2016-06-10 MED ORDER — INSULIN ASPART 100 UNIT/ML ~~LOC~~ SOLN: 3.0000 [IU] | Freq: Three times a day (TID) | SUBCUTANEOUS | 11 refills | Status: DC

## 2016-06-10 MED ORDER — LIDOCAINE HCL 1 % IJ SOLN
INTRAMUSCULAR | Status: DC | PRN
Start: 2016-06-10 — End: 2016-06-11
  Administered 2016-06-10: 10 mL

## 2016-06-10 MED ORDER — INSULIN GLARGINE 100 UNIT/ML ~~LOC~~ SOLN: 28.0000 [IU] | SUBCUTANEOUS | 11 refills | Status: DC

## 2016-06-10 MED ORDER — INSULIN ASPART 100 UNIT/ML ~~LOC~~ SOLN: SUBCUTANEOUS | 11 refills | Status: DC

## 2016-06-10 MED ORDER — CEFAZOLIN IV (FOR PTA / DISCHARGE USE ONLY): 2.0000 g | Freq: Three times a day (TID) | INTRAVENOUS | 0 refills | Status: AC

## 2016-06-10 MED ORDER — HEPARIN SOD (PORK) LOCK FLUSH 100 UNIT/ML IV SOLN
INTRAVENOUS | Status: AC
  Administered 2016-06-10: 500 [IU]
  Filled 2016-06-10: qty 5

## 2016-06-10 MED ORDER — LIDOCAINE HCL (PF) 1 % IJ SOLN
INTRAMUSCULAR | Status: AC
  Filled 2016-06-10: qty 10

## 2016-06-10 MED ORDER — NEPRO/CARBSTEADY PO LIQD: 237.0000 mL | Freq: Two times a day (BID) | ORAL | 0 refills | Status: DC

## 2016-06-10 NOTE — Discharge Instructions (Signed)
Bone and Joint Infections, Adult Introduction Bone infections (osteomyelitis) and joint infections (septic arthritis) occur when bacteria or other germs get inside a bone or a joint. This can happen if you have an infection in another part of your body that spreads through your blood. Germs from your skin or from outside of your body can also cause this type of infection if you have a wound or a broken bone (fracture) that breaks the skin. Anyone can get a bone infection or joint infection. You may be more likely to get this type of infection if you have a condition, such as diabetes, that lowers your ability to fight infection or increases your chances of getting an infection. Bone and joint infections can cause damage, and they can spread to other areas of your body. They need to be treated quickly. What are the causes? Most bone and joint infections are caused by bacteria. They can also be caused by other germs, such as viruses and funguses. What increases the risk? This condition is more likely to develop in:  People who recently had surgery, especially bone or joint surgery.  People who have a long-term (chronic) disease, such as:  HIV (human immunodeficiency virus).  Diabetes.  Rheumatoid arthritis.  Sickle cell anemia.  Elderly people.  People who take medicines that block or weaken the bodys defense system (immune system).  People who have a condition that reduces their blood flow.  People who are on kidney dialysis.  People who have an artificial joint.  People who have had a joint or bone repaired with plates or screws (surgical hardware).  People who use or abuse IV drugs.  People who have had trauma, such as stepping on a nail. What are the signs or symptoms? Symptoms vary depending on the type and location of your infection. Common symptoms of bone and joint infections include:  Fever and chills.  Redness and warmth.  Swelling.  Pain and  stiffness.  Drainage of fluid or pus near the infection.  Weight loss and fatigue.  Decreased ability to use a hand or foot. How is this diagnosed? This condition may be diagnosed based on symptoms, medical history, a physical exam, and diagnostic tests. Tests can help to identify the cause of the infection. You may have various tests, such as:  A sample of tissue, fluid, or blood taken to be examined under a microscope.  A procedure to remove fluid from the infected joint with a needle (joint aspiration) for testing in a lab.  Pus or discharge swabbed from a wound for testing to identify germs and to determine what type of medicine will kill them (culture and sensitivity).  Blood tests to look for evidence of infection and inflammation (biomarkers).  Imaging studies to determine how severe the bone or joint infection is. These may include:  X-rays.  CT scan.  MRI.  Bone scan. How is this treated? Treatment depends on the cause and type of infection. Antibiotic medicines are usually the first treatment for a bone or joint infection. Treatment with antibiotics may include:  Getting IV antibiotics. This may be done in a hospital at first. You may have to continue IV antibiotics at home for several weeks. You may also have to take antibiotics by mouth for several weeks after that.  Taking more than one kind of antibiotic. Treatment may start with a type of antibiotic that works against many different bacteria (broad spectrumantibiotics). IV antibiotics may be changed if tests show that another type may  work better. Other treatments may include:  Draining fluid from the joint by placing a needle into it (aspiration).  Surgery to remove:  Dead or dying tissue from a bone or joint.  An infected artificial joint.  Infected plates or screws that were used to repair a broken bone. Follow these instructions at home:  Take medicines only as directed by your health care  provider.  Take your antibiotic medicine as directed by your health care provider. Finish the antibiotic even if you start to feel better.  Follow instructions from your health care provider about how to take IV antibiotics at home.  Ask your health care provider if you have any restrictions on your activities.  Keep all follow-up visits as directed by your health care provider. This is important. Contact a health care provider if:  You have a fever or chills.  You have redness, warmth, pain, or swelling that returns after treatment. Get help right away if:  You have rapid breathing or you have trouble breathing.  You have chest pain.  You cannot drink fluids or make urine.  The affected arm or leg swells, changes color, or turns blue. This information is not intended to replace advice given to you by your health care provider. Make sure you discuss any questions you have with your health care provider. Document Released: 05/04/2005 Document Revised: 10/10/2015 Document Reviewed: 05/02/2014  2017 Elsevier   Bacteremia Introduction Bacteremia is the presence of bacteria in the blood. A small amount of bacteria may not cause any symptoms. Sometimes, the bacteria spread and cause infection in other parts of the body, such as the heart, joints, bones, or brain. Having a great amount of bacteria can cause a serious, sometimes life-threatening infection called sepsis. What are the causes? This condition is caused by bacteria that get into the blood. Bacteria can enter the blood:  During a dental or medical procedure.  After you brush your teeth so hard that the gums bleed.  Through a scrape or cut on your skin. More severe types of bacteremia can be caused by:  A bacterial infection, such as pneumonia, that spreads to the blood.  Using a dirty needle. What increases the risk? This condition is more likely to develop in:  Children and elderly adults.  People who have a  long-lasting (chronic) disease or medical condition.  People who have an artificial joint or heart valve.  People who have heart valve disease.  People who have a tube, such as a catheter or IV tube, that has been inserted for a medical treatment.  People who have a weak body defense system (immune system).  People who use IV drugs. What are the signs or symptoms? Usually, this condition does not cause symptoms when it is mild. When it is more serious, it may cause:  Fever.  Chills.  Racing heart.  Shortness of breath.  Dizziness.  Weakness.  Confusion.  Nausea or vomiting.  Diarrhea. Bacteremia that has spread to other parts of the body may cause symptoms in those areas. How is this diagnosed? This condition may be diagnosed with a physical exam and tests, such as:  A complete blood count (CBC). This test looks for signs of infection.  Blood cultures. These look for bacteria in your blood.  Tests of any IV tubes. These look for a source of infection.  Urine tests.  Imaging tests, such as an X-ray, CT scan, MRI, or heart ultrasound. How is this treated? If the condition is mild,  treatment is usually not needed. Usually, the bodys immune system will remove the bacteria. If the condition is more serious, it may be treated with:  Antibiotic medicines through an IV tube. These may be given for about 2 weeks. At first, the antibiotic that is given may kill most types of blood bacteria. If your test results show that a certain kind of bacteria is causing problems, the antibiotic may be changed to kill only the bacteria that are causing problems.  Antibiotics taken by mouth.  Removing any catheter or IV tube that is a source of infection.  Blood pressure and breathing support, if needed.  Surgery to control the source or spread of infection, if needed. Follow these instructions at home:  Take over-the-counter and prescription medicines only as told by your health  care provider.  If you were prescribed an antibiotic, take it as told by your health care provider. Do not stop taking the antibiotic even if you start to feel better.  Rest at home until your condition is under control.  Drink enough fluid to keep your urine clear or pale yellow.  Keep all follow-up visits as told by your health care provider. This is important. How is this prevented? Take these actions to help prevent future episodes of bacteremia:  Get all vaccinations as recommended by your health care provider.  Clean and cover scrapes or cuts.  Bathe regularly.  Wash your hands often.  Before any dental or surgical procedure, ask your health care provider if you should take an antibiotic. Contact a health care provider if:  Your symptoms get worse.  You continue to have symptoms after treatment.  You develop new symptoms after treatment. Get help right away if:  You have chest pain or trouble breathing.  You develop confusion, dizziness, or weakness.  You develop pale skin. This information is not intended to replace advice given to you by your health care provider. Make sure you discuss any questions you have with your health care provider. Document Released: 02/15/2006 Document Revised: 11/22/2015 Document Reviewed: 07/07/2014  2017 [employer]vier

## 2016-06-10 NOTE — Clinical Social Work Note (Addendum)
CSW advised during morning progression meeting that patient medically stable for discharge, however learned later that patient will need PICC line or other line placement for IV antibiotics. Patient will discharge to [facility] in [place], [place]. Phone number - [phone] for admissions. Main number [phone]. **CSW advised that patient must be at facility by 3 pm.**  Talked with [name], dispatcher with [facility] regarding cost for transport and the cost is $1,647.41 (121 miles and a minimum of 2 hour 20 minute commute). CSW talked with patient regarding having to pay for the transport and she is willing as does not want to remain in [place] for rehab. However, she does not have her checkbook with her. Patient provided CSW with her daughter's name Tracy Barker: [phone]) and phone number to contact her regarding payment for transport. Call made to daughter and message left. Received call from Ms. Barker (1:02 pm) and updated her on probable discharge Thursday and ambulance transport cost. She was given phone number for [facility] and dispatcher's name so that she can asked questions regarding paying for the transport.  Call made to Tammy with [facility] and informed her that patient not discharging today. CSW will continue to follow and facilitate discharge to skilled facility when medically stable.  Tracy Barker, MSW, LCSW Licensed Clinical Social Worker [place] [phone]

## 2016-06-10 NOTE — Discharge Summary (Addendum)
[facility]  Physician Discharge Summary   Patient ID: Tracy Barker MRN: [id number] DOB/AGE: 71-12-1945 71 y.o.  Admit date: 05/27/2016 Discharge date: 06/10/2016  PCP: No primary care provider on file.  DISCHARGE DIAGNOSES:  Principal Problem:   Staphylococcus aureus bacteremia with sepsis ([name]) Active Problems:   Shock ([name])   Abdominal pain   Acute renal failure (ARF) (HCC)   Increased anion gap metabolic acidosis   Cavitary pneumonia   Skin ulcer of toe of left foot, limited to breakdown of skin (HCC)   Acute respiratory failure with hypoxia (HCC)   Acute osteomyelitis of left ankle or foot (HCC)   Chronic osteomyelitis of toe of left foot (HCC)   Severe protein-calorie malnutrition ([name])   Diabetic polyneuropathy associated with type 2 diabetes mellitus ([name])   RECOMMENDATIONS FOR OUTPATIENT FOLLOW UP: 1. IV antibiotics till March 3. Please see below under medication list for instruction regarding labs 2. Patient needs to follow-up with her primary care provider and other specialists within the next 2-3 weeks. 3. Patient's chlorambucil has been held for now as she did have significant thrombocytopenia at admission. This will need to be resumed in discussions with her PCP or specialist. 4. PCP to also determine the role for anticoagulation as she did have atrial fibrillation during this hospitalization. 5. Monitor CBGs  Wound Care: Paint toe ulcers with betadine daily, allow to air dry Silicone foam dressing to the gluteal fold wound, assess at least Q shift for changes.  DISCHARGE CONDITION: fair  Diet recommendation: Modified carbohydrate  Filed Weights   06/06/16 2026 06/07/16 0255 06/07/16 2109  Weight: 73.5 kg (162 lb 0.6 oz) 73.5 kg (162 lb 0.6 oz) 73.3 kg (161 lb 9.6 oz)    INITIAL HISTORY: 71 year old female with PMH of DM, HTN, and CLL. Patient was transferred here from [name]. She was recently been seen there for cough x 1 month  treated with Zpak, and fall with no apparent injury. On 1/9 she presented to [name] for weakness x about 24 hours and several episodes of vomiting. EMS was called and upon their arrival she was alert and oriented. In ED she was noted to additionally have some L sided weakness. Laboratory evaluation significant for hyperkalemia, renal failure, and leukocytosis. She was given broad spectrum antibiotics (mero and linezolid) and temporizing therapies for hyperkalemia were used. Despite these therapies she became progressively more lethargic and developed shock with mssa bacteremia. She was started on levophed with escalating doses and eventually was started on epinephrine as well. She was transferred on 1/10 to [name] for ICU admission. She was intubated for acute hypoxemic respiratory failure on arrival to [name] and received CVVHD for AKI. She was extubated and transferred to the floor with slow improvement.   Consultants:  Ortho  ID  PCCM  Nephrology  Procedures: Intubation on 1/10>>1/12 R fem CVL 1/10 >>> Aline placement and removal TEE on 1/12 CVVHD  STUDIES: CT head 1/9> non-acute CT abdomen pelvis 1/9 > non-acute, gallbladder distention, colonic wall thickening CT chest 1/9 >bilateral opacities and cavitary lung masses  US Renal 1/11 >no evidence of hydronephrosis; 3 cm left adrenal mass  DG Left Foot >>osteomyelitis and septic arthritis  2 D ECHO >> Left ventricle: The cavity size was normal. Systolic function was vigorous. The estimated ejection fraction was in the range of 65% to 70%. Wall motion was normal; there were no regional wall motion abnormalities. Doppler parameters are consistent with abnormal left ventricular  relaxation (grade 1 diastolic dysfunction). - Ventricular septum: The contour showed diastolic flattening. - Mitral valve: Calcified annulus. - Right ventricle: The cavity size was severely dilated.  Wall thickness was normal. Systolic function was mildly reduced. - Pulmonary arteries: Systolic pressure was severely increased. PA peak pressure: 71 mm Hg (S).   HOSPITAL COURSE:   MSSA bacteremia/septic shock The patient required mechanical ventilation, pressor support in ICU. She was subsequently extubated and taken off of pressors. She was seen by infectious disease. Source of the bacteremia was thought to be phones on her left foot. She underwent a TEE which was negative for endocarditis. Repeat blood culture 1/13 and 1/17 remained positive  Repeat blood cultures (1/21) negative so far. IV antibiotics to be given through the central line that was placed in the ICU. Due to chronic kidney disease Patient cannot have a PICC line. Patient currently on Ancef. It should be continued till March 3.  Left sided toe wounds/ necrosis Patient seen by orthopedics. It is felt that patient has chronic osteomyelitis. Amputation was offered to the patient, however, patient would like to discuss with her providers at her home facility. No urgent indication for amputation. Hence, it is reasonable to defer this to her usual providers. Orthopedics ordered uric acid level as well as some concern for gout. Uric acid level was 6.3. Due to all of the blood dyscrasias and metabolic arrangements we will not initiate any definitive treatment for now. This will need to be considered in the next few weeks depending on patient's clinical status.  Normocytic Anemia Patient did have a steady drop in her hemoglobin from 10 to 6.9. She was transfused PRBC. Anemia panel suggested anemia of chronic disease. No overt bleeding was noted. Hemoglobin has responded. Will need to be monitored at the skilled nursing facility.   Acute on chronic kidney disease stage III  Thought to be due to ATN. She required CRRT while in ICU. Renal function has stabilized. Creatinine is stable. Foley catheter was discontinued. Monitor renal  function at the skilled nursing facility.   Cavitary lung masses/ acute respiratory failure Likely septic emboli due to bacteremia vs aspiration pneumonia with cavitations. Received antibiotic treatment. No longer hypoxic. Recommend to repeat imaging to document resolution in few weeks   Thrombocytopenia Patient was found to be significantly thrombocytopenic and the time of admission. This was thought to be secondary to acute infection. HIT panel negative. Patient does have a history of CLL and was noted to be on chlorambucil which can cause blood dyscrasias. CT abd/pelvis on presentation did not reveal liver or spleen abnormalities. DIC panel was negative. Platelet counts subsequently started improving. She did not have any bleeding. Monitor counts in the outpatient setting.   Hypotension with h/o HTN Blood pressure remains borderline low. She is, however, asymptomatic. Holding all of her antihypertensive agents for now. She was on stress dose steroids, which has been tapered off. Continue to hold blood pressure lowering agents. Monitor BP closely.   History of CLL Patient does have chronically elevated WBC counts. Noted to be on chlorambucil at home, which is presumably been prescribed for this reason. Due to thrombocytopenia this was held. We'll continue to hold it. We'll defer reinitiation to her providers at her home town.  Paroxysmal atrial fibrillation  Patient was noted to have A. fib with RVR. She required amiodarone drip in ICU. Currently NSR. TSH 4.5. Echocardiogram as above. Not thought to be a candidate for anticoagulation due to thrombocytopenia. However, her platelet counts have  improved. Will however, defer this to her outpatient providers at [facility].   Diabetes mellitus type II Noted to be poorly controlled. Was on multiple oral agents. Has been on Lantus here. HbA1c 10.3. Also on sliding scale coverage. Monitor CBGs at the nursing facility.  Morbid obesity.  Body mass index  is 33.27 kg/m.  Overall, patient has been stable. Seen by physical therapy. Will require short-term rehabilitation at skilled nursing facility. She has been accepted to one of the facilities at her home town. Okay for discharge today.   PERTINENT LABS:  The results of significant diagnostics from this hospitalization (including imaging, microbiology, ancillary and laboratory) are listed below for reference.    Microbiology: Recent Results (from the past 240 hour(s))  Culture, blood (Routine X 2) w Reflex to ID Panel     Status: None   Collection Time: 06/03/16  1:46 PM  Result Value Ref Range Status   Specimen Description BLOOD RIGHT ARM  Final   Special Requests BOTTLES DRAWN AEROBIC ONLY  5 CC  Final   Culture NO GROWTH 5 DAYS  Final   Report Status 06/08/2016 FINAL  Final  Culture, blood (Routine X 2) w Reflex to ID Panel     Status: Abnormal   Collection Time: 06/03/16  1:59 PM  Result Value Ref Range Status   Specimen Description BLOOD LEFT HAND  Final   Special Requests IN PEDIATRIC BOTTLE  1.0 CC  Final   Culture  Setup Time   Final    GRAM POSITIVE COCCI IN CLUSTERS AEROBIC BOTTLE ONLY CRITICAL RESULT CALLED TO, READ BACK BY AND VERIFIED WITH: Jerel Shepherd PHARMD 1929 06/04/16 A BROWNING    Culture STAPHYLOCOCCUS AUREUS (A)  Final   Report Status 06/06/2016 FINAL  Final   Organism ID, Bacteria STAPHYLOCOCCUS AUREUS  Final      Susceptibility   Staphylococcus aureus - MIC*    CIPROFLOXACIN <=0.5 SENSITIVE Sensitive     ERYTHROMYCIN <=0.25 SENSITIVE Sensitive     GENTAMICIN <=0.5 SENSITIVE Sensitive     OXACILLIN 0.5 SENSITIVE Sensitive     TETRACYCLINE <=1 SENSITIVE Sensitive     VANCOMYCIN <=0.5 SENSITIVE Sensitive     TRIMETH/SULFA <=10 SENSITIVE Sensitive     CLINDAMYCIN <=0.25 SENSITIVE Sensitive     RIFAMPIN <=0.5 SENSITIVE Sensitive     Inducible Clindamycin NEGATIVE Sensitive     * STAPHYLOCOCCUS AUREUS  Culture, blood (routine x 2)     Status: None (Preliminary  result)   Collection Time: 06/07/16  4:50 AM  Result Value Ref Range Status   Specimen Description BLOOD LEFT ARM  Final   Special Requests BOTTLES DRAWN AEROBIC ONLY 7ML  Final   Culture NO GROWTH 2 DAYS  Final   Report Status PENDING  Incomplete  Culture, blood (routine x 2)     Status: None (Preliminary result)   Collection Time: 06/07/16  5:05 AM  Result Value Ref Range Status   Specimen Description BLOOD LEFT HAND  Final   Special Requests IN PEDIATRIC BOTTLE 3ML  Final   Culture NO GROWTH 2 DAYS  Final   Report Status PENDING  Incomplete     Labs: Basic Metabolic Panel:  Recent Labs Lab 06/05/16 0741 06/06/16 1252 06/07/16 0349 06/08/16 0415 06/10/16 0354  NA 139 135 137 137 139  K 3.8 3.8 4.0 3.8 3.6  CL 105 103 104 104 104  CO2 _0 GLUCOSE 178* 366* 173* 241* 191*  BUN 91* 79* 67* 60*  55*  CREATININE 2.50* 2.09* 1.89* 1.63* 1.56*  CALCIUM 8.4* 8.0* 8.0* 7.8* 8.1*   CBC:  Recent Labs Lab 06/06/16 1252 06/07/16 0349 06/08/16 0415 06/08/16 1500 06/09/16 0409 06/10/16 0354  WBC 38.9* 35.6* 26.3*  --  27.9* 24.8*  HGB 7.4* 7.2* 6.6* 8.5* 7.9* 8.1*  HCT 22.9* 22.6* 21.1* 26.2* 24.6* 26.0*  MCV 85.4 85.6 86.5  --  85.7 86.4  PLT 69* 100* 144*  --  151 199   CBG:  Recent Labs Lab 06/09/16 1622 06/09/16 2010 06/10/16 0018 06/10/16 0412 06/10/16 0754  GLUCAP 242* 235* 202* 170* 179*     IMAGING STUDIES Ct Abdomen Pelvis Wo Contrast  Result Date: 05/27/2016 CLINICAL DATA:  Cough and weakness EXAM: CT CHEST, ABDOMEN AND PELVIS WITHOUT CONTRAST TECHNIQUE: Multidetector CT imaging of the chest, abdomen and pelvis was performed following the standard protocol without IV contrast. COMPARISON:  None. FINDINGS: CT CHEST FINDINGS Cardiovascular: No evidence of aortic aneurysm. Little if any atherosclerotic calcifications. Mediastinum/Nodes: No abnormal mediastinal adenopathy. Small scattered mediastinal nodes are noted. Bilateral abnormal axillary  adenopathy is present. 10 mm right axillary node on image 17. 11 mm left axillary node on image 14. 10 mm right level for neck lymph node on image 3. Similar left-sided node measures 10 mm on image 1. Endotracheal and NG tubes are in place. Lungs/Pleura: Small bilateral pleural effusions. Bibasilar dependent consolidation. Multiple bilateral ill-defined pulmonary nodules with cavitation are noted. The largest ill-defined mass is present in the left upper lobe measuring 1.8 cm on image 36. This particular lesion is indeterminate and somewhat patchy. There is a cavitary lesion in the lingula on image 51 measuring 1.6 cm. The largest lesion on the right is at the right apex and is cavitary measuring 1.4 cm on image 18. No pneumothorax. Musculoskeletal: No acute vertebral compression deformity. CT ABDOMEN PELVIS FINDINGS Hepatobiliary: No obvious focal liver mass. The left lobe is prominent. There is no evidence of nodularity. The gallbladder is markedly distended. It measures up to 6.4 cm in diameter. Pancreas: Atrophic. Spleen: Within normal limits. Adrenals/Urinary Tract: The kidneys are unremarkable bilaterally. The right adrenal gland is within normal limits. There is a nonspecific 3.0 cm mass in the region of the left adrenal gland on image 63. This is nonspecific and may represent an adrenal mass or possibly a vascular abnormality such as varix or aneurysm. Stomach/Bowel: NG tube tip is in the body of the stomach. There is no evidence of small-bowel obstruction. There is mild wall thickening of the ascending and descending colon. There is also apparent wall thickening of the sigmoid colon and rectum. Is difficult to determine if this is due to decompression or possibly volume overload. Diverticulosis of the descending colon is present. Vascular/Lymphatic: Innumerable relatively small lymph nodes are present in the gastrohepatic ligament. Several abnormal soft tissue densities are seen in the left periaortic  region worrisome for abnormal adenopathy. Without contrast, it is difficult to determine if this represents venous structures or lymph nodes. If it is a lymph node, the largest conglomeration on image 76 measures 1.7 cm in short axis diameter. There is also a suspected left common iliac node measuring 1.4 cm on image 92. Smaller right iliac nodes are noted. Right femoral venous catheter is in place. Left common femoral arterial catheter is in place. No evidence of aortic aneurysm. Minimal atherosclerotic calcifications of the aorta. Reproductive: Uterus and adnexa are within normal limits. Other: No free-fluid. Musculoskeletal: No vertebral compression deformity. Multilevel degenerative disc disease is  suspected. IMPRESSION: Bilateral indeterminate pulmonary opacities and cavitary lung masses as described. Differential diagnosis includes malignancy such as metastatic disease from squamous cell carcinoma. Also consider septic emboli and Wegner's granulomatosis. Small pleural effusions. Dependent bilateral lung consolidation in an aspiration pattern Bilateral axillary and neck adenopathy. There is also adenopathy in the abdomen and pelvis. Malignancy such as lymphoproliferative disorders cannot be excluded. Also consider opportunistic infection such as fungal or mycobacterium species. Dilated gallbladder.  Gallbladder hydrops is not excluded. Wall thickening of the colon. It is diffuse suggesting volume overload. This is a nonspecific finding. 3.0 cm soft tissue mass in the region of the left adrenal gland. Differential diagnosis includes metastatic disease, varix, or aneurysm. Contrast-enhanced study is recommended. Electronically Signed   By: Marybelle Killings M.D.   On: 05/27/2016 15:10   Ct Head Wo Contrast  Result Date: 05/27/2016 CLINICAL DATA:  CLL (unclear treatment status), hypertension and diabetes was transferred to this hospital from the emergency room at [facility] for the management of severe sepsis  after she presented there with cough and weakness. Reports noted of recent vomiting. Upon transfer here, was having increasing work of breathing/changes in mentation with inability to protect airway for which she was intubated. She was also started on pressors for shock and workup is underway for etiology of sepsis. There is concern of mesenteric/gut ischemia given resident dictation findings and elevated lactic acid level (of note, she was taking metformin). A review of her previous medical records shows that she has chronic kidney disease stage III and her baseline creatinine is about 1.2-1.3 EXAM: CT HEAD WITHOUT CONTRAST TECHNIQUE: Contiguous axial images were obtained from the base of the skull through the vertex without intravenous contrast. COMPARISON:  None. FINDINGS: Brain: No evidence of acute infarction, hemorrhage, hydrocephalus, extra-axial collection or mass lesion/mass effect. Mild atrophy. Vascular: No hyperdense vessel or unexpected calcification. Skull: Normal. Negative for fracture or focal lesion. Sinuses/Orbits: No acute finding. Other: None. IMPRESSION: 1. Negative for bleed or other acute intracranial process. Electronically Signed   By: Lucrezia Europe M.D.   On: 05/27/2016 14:57   Ct Chest Wo Contrast  Result Date: 05/27/2016 CLINICAL DATA:  Cough and weakness EXAM: CT CHEST, ABDOMEN AND PELVIS WITHOUT CONTRAST TECHNIQUE: Multidetector CT imaging of the chest, abdomen and pelvis was performed following the standard protocol without IV contrast. COMPARISON:  None. FINDINGS: CT CHEST FINDINGS Cardiovascular: No evidence of aortic aneurysm. Little if any atherosclerotic calcifications. Mediastinum/Nodes: No abnormal mediastinal adenopathy. Small scattered mediastinal nodes are noted. Bilateral abnormal axillary adenopathy is present. 10 mm right axillary node on image 17. 11 mm left axillary node on image 14. 10 mm right level for neck lymph node on image 3. Similar left-sided node measures 10  mm on image 1. Endotracheal and NG tubes are in place. Lungs/Pleura: Small bilateral pleural effusions. Bibasilar dependent consolidation. Multiple bilateral ill-defined pulmonary nodules with cavitation are noted. The largest ill-defined mass is present in the left upper lobe measuring 1.8 cm on image 36. This particular lesion is indeterminate and somewhat patchy. There is a cavitary lesion in the lingula on image 51 measuring 1.6 cm. The largest lesion on the right is at the right apex and is cavitary measuring 1.4 cm on image 18. No pneumothorax. Musculoskeletal: No acute vertebral compression deformity. CT ABDOMEN PELVIS FINDINGS Hepatobiliary: No obvious focal liver mass. The left lobe is prominent. There is no evidence of nodularity. The gallbladder is markedly distended. It measures up to 6.4 cm in diameter. Pancreas: Atrophic.  Spleen: Within normal limits. Adrenals/Urinary Tract: The kidneys are unremarkable bilaterally. The right adrenal gland is within normal limits. There is a nonspecific 3.0 cm mass in the region of the left adrenal gland on image 63. This is nonspecific and may represent an adrenal mass or possibly a vascular abnormality such as varix or aneurysm. Stomach/Bowel: NG tube tip is in the body of the stomach. There is no evidence of small-bowel obstruction. There is mild wall thickening of the ascending and descending colon. There is also apparent wall thickening of the sigmoid colon and rectum. Is difficult to determine if this is due to decompression or possibly volume overload. Diverticulosis of the descending colon is present. Vascular/Lymphatic: Innumerable relatively small lymph nodes are present in the gastrohepatic ligament. Several abnormal soft tissue densities are seen in the left periaortic region worrisome for abnormal adenopathy. Without contrast, it is difficult to determine if this represents venous structures or lymph nodes. If it is a lymph node, the largest conglomeration  on image 76 measures 1.7 cm in short axis diameter. There is also a suspected left common iliac node measuring 1.4 cm on image 92. Smaller right iliac nodes are noted. Right femoral venous catheter is in place. Left common femoral arterial catheter is in place. No evidence of aortic aneurysm. Minimal atherosclerotic calcifications of the aorta. Reproductive: Uterus and adnexa are within normal limits. Other: No free-fluid. Musculoskeletal: No vertebral compression deformity. Multilevel degenerative disc disease is suspected. IMPRESSION: Bilateral indeterminate pulmonary opacities and cavitary lung masses as described. Differential diagnosis includes malignancy such as metastatic disease from squamous cell carcinoma. Also consider septic emboli and Wegner's granulomatosis. Small pleural effusions. Dependent bilateral lung consolidation in an aspiration pattern Bilateral axillary and neck adenopathy. There is also adenopathy in the abdomen and pelvis. Malignancy such as lymphoproliferative disorders cannot be excluded. Also consider opportunistic infection such as fungal or mycobacterium species. Dilated gallbladder.  Gallbladder hydrops is not excluded. Wall thickening of the colon. It is diffuse suggesting volume overload. This is a nonspecific finding. 3.0 cm soft tissue mass in the region of the left adrenal gland. Differential diagnosis includes metastatic disease, varix, or aneurysm. Contrast-enhanced study is recommended. Electronically Signed   By: Marybelle Killings M.D.   On: 05/27/2016 15:10   US Renal  Result Date: 05/28/2016 CLINICAL DATA:  Acute onset of renal insufficiency. Initial encounter. EXAM: RENAL / URINARY TRACT ULTRASOUND COMPLETE COMPARISON:  CT of the chest, abdomen and pelvis performed 05/27/2016 FINDINGS: Right Kidney: Length: 9.1 cm. Echogenicity within normal limits. Trace right-sided perinephric fluid is noted. No mass or hydronephrosis visualized. Left Kidney: Length: 10.7 cm.  Echogenicity within normal limits. No mass or hydronephrosis visualized. Bladder: Decompressed, with a Foley catheter in place. Trace bilateral pleural effusions are noted. The gallbladder is significantly distended, similar in appearance to the recent CT. 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 IMPRESSION: 1. No evidence of hydronephrosis. 2. Trace bilateral pleural effusions. 3. Significant gallbladder distention again noted, similar in appearance to the recent CT. 4. 3 cm mass again noted at the region of the left adrenal gland. Adrenal protocol MRI or CT would be helpful for further evaluation, when and as deemed clinically appropriate. Electronically Signed   By: Garald Balding M.D.   On: 05/28/2016 02:41   Dg Chest Port 1 View  Result Date: 06/04/2016 CLINICAL DATA:  New left IJ catheter.  History of renal failure. EXAM: PORTABLE CHEST 1 VIEW COMPARISON:  05/28/2016 CXR  FINDINGS: Borderline cardiomegaly. Aortic atherosclerosis. Mild vascular congestion with bibasilar atelectasis. No pneumothorax. Left IJ catheter tip projects over the expected location of the proximal SVC. No acute osseous abnormality. IMPRESSION: Borderline cardiomegaly with mild vascular congestion and bibasilar atelectasis. No pneumothorax. Left IJ catheter tip projects over the expected region of the proximal SVC. Electronically Signed   By: Ashley Royalty M.D.   On: 06/04/2016 16:37   Dg Chest Port 1 View  Result Date: 05/28/2016 CLINICAL DATA:  71 year old female with shock, renal failure. Intubated. Initial encounter. EXAM: PORTABLE CHEST 1 VIEW COMPARISON:  05/27/2016. FINDINGS: Portable AP semi upright view at 0444 hours. Stable endotracheal tube tip situated between the clavicles and carina. Enteric tube courses to the left abdomen, tip not included. Stable left IJ approach dual lumen dialysis type catheter. Stable lung volumes. Stable cardiac size and mediastinal contours. Streaky in confluent left  lung base opacity is stable. No pneumothorax or pulmonary edema. No definite pleural effusion. No areas of worsening ventilation. IMPRESSION: 1.  Stable lines and tubes. 2. Stable ventilation with left lower lobe atelectasis versus consolidation. Electronically Signed   By: Genevie Ann M.D.   On: 05/28/2016 06:50   Dg Chest Portable 1 View  Result Date: 05/27/2016 CLINICAL DATA:  Central line placement EXAM: PORTABLE CHEST 1 VIEW COMPARISON:  CT chest 05/27/2016, radiograph 05/27/2016 FINDINGS: Endotracheal tube tip is approximately 2.3 cm superior to the carina. Left-sided central venous catheter tip overlies the distal SVC. No left pneumothorax is visualized. Cardiomegaly and central vascular congestion persists. Bilateral nodular lung opacities as before. Slight increased atelectasis or infiltrate at the lung bases. Possible tiny left effusion. IMPRESSION: 1. Left-sided central venous catheter tip overlies the distal SVC. No pneumothorax 2. Cardiomegaly with central vascular congestion 3. Stable bilateral pulmonary nodular opacities. Slight increase in bibasilar atelectasis or infiltrate. Possible tiny left effusion. Electronically Signed   By: Donavan Foil M.D.   On: 05/27/2016 17:12   Dg Chest Port 1 View  Result Date: 05/27/2016 CLINICAL DATA:  Difficulty intubation.  Central line placement. EXAM: PORTABLE CHEST 1 VIEW COMPARISON:  None. FINDINGS: Cardiac enlargement without vascular congestion. Right hilum is prominent. This could be due to central pulmonary artery dilatation nor 2 lymphadenopathy. Consider CT for further evaluation. Vague nodular opacities in the mid lungs measuring less than 1 cm. These could also be evaluated at CT. No focal consolidation or airspace disease. No blunting of costophrenic angles. No pneumothorax. Tortuous aorta. Endotracheal tube tip measures 2.2 cm above the carina. IMPRESSION: Endotracheal tube tip measures 2.2 cm above the carina. Cardiac enlargement. Nonspecific  prominence of the right hilum with small nodular opacities in both lungs are indeterminate. CT recommended for further evaluation. Electronically Signed   By: Lucienne Capers M.D.   On: 05/27/2016 05:41   Dg Abd Portable 1v  Result Date: 05/27/2016 CLINICAL DATA:  Orogastric tube placement EXAM: PORTABLE ABDOMEN - 1 VIEW COMPARISON:  None. FINDINGS: Orogastric tube tip and side port are in the stomach. There is a catheter on the right placed in the right femoral region with tip at the expected level of the right common iliac vein. There is no bowel dilatation or air-fluid levels suggesting bowel obstruction. No free air. 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 IMPRESSION: Orogastric tube tip and side port in stomach. Femoral catheter on the right with tip in the expected location of the right common iliac vein. Bowel gas pattern unremarkable. No bowel obstruction or free air evident. Electronically Signed   By:  Lowella Grip III M.D.   On: 05/27/2016 08:39   Dg Foot 2 Views Left  Result Date: 05/28/2016 CLINICAL DATA:  Swelling.  Ulcerations. EXAM: LEFT FOOT - 2 VIEW COMPARISON:  No recent prior . FINDINGS: Diffuse soft tissue swelling, particular prominent the left second and third digits. Left fourth digit amputation. Erosive changes noted of the distal aspect of the proximal phalanx of the left second digit and proximal aspect of the middle phalanx of the left second digit. Osteomyelitis and septic arthritis could present in this fashion. Subluxation of the metacarpal phalangeal joints of the second and third digit noted. Degenerative changes noted about the foot. IMPRESSION: 1. Diffuse soft tissue swelling with particularly prominent soft tissue swelling about the left second and third digits. Bony erosive changes noted about the distal aspect of the proximal phalanx and proximal aspect of the middle phalanx of the left second digit. Findings consist with osteomyelitis and septic arthritis. 2.  Subluxation of the second and third metacarpal phalangeal joints. 3.  Prior left fourth digit amputation. Electronically Signed   By: Marcello Moores  Register   On: 05/28/2016 12:05    DISCHARGE EXAMINATION: Vitals:   06/09/16 1841 06/09/16 2015 06/10/16 0414 06/10/16 0932  BP: 112/60 (!) 103/38 (!) 106/47 (!) 93/40  Pulse: 85 95 89 80  Resp: _0 Temp: 98 F (36.7 C) 99.8 F (37.7 C) 98.7 F (37.1 C) 98.5 F (36.9 C)  TempSrc: Oral Oral Oral Oral  SpO2: 97% 95% 95% 100%  Weight:      Height:       General appearance: alert, cooperative, appears stated age and no distress Resp: clear to auscultation bilaterally Cardio: regular rate and rhythm, S1, S2 normal, no murmur, click, rub or gallop GI: soft, non-tender; bowel sounds normal; no masses,  no organomegaly Extremities: Minimal edema noted bilateral lower extremities. Wounds noted on the toes of the left foot  DISPOSITION: SNF  Discharge Instructions    Call MD for:  extreme fatigue    Complete by:  As directed    Call MD for:  persistant dizziness or light-headedness    Complete by:  As directed    Call MD for:  persistant nausea and vomiting    Complete by:  As directed    Call MD for:  severe uncontrolled pain    Complete by:  As directed    Call MD for:  temperature >100.4    Complete by:  As directed    Discharge instructions    Complete by:  As directed    See instructions on the discharge summary.  You were cared for by a hospitalist during your hospital stay. If you have any questions about your discharge medications or the care you received while you were in the hospital after you are discharged, you can call the unit and asked to speak with the hospitalist on call if the hospitalist that took care of you is not available. Once you are discharged, your primary care physician will handle any further medical issues. Please note that NO REFILLS for any discharge medications will be authorized once you are discharged,  as it is imperative that you return to your primary care physician (or establish a relationship with a primary care physician if you do not have one) for your aftercare needs so that they can reassess your need for medications and monitor your lab values. If you do not have a primary care physician, you can call [phone] for a  physician referral.   Home infusion instructions [facility] May follow [place] Dosing Protocol; May administer Cathflo as needed to maintain patency of vascular access device.; Flushing of vascular access device: per [facility] Protocol: 0.9% NaCl pre/post medica...    Complete by:  As directed    Instructions:  May follow [place] Dosing Protocol   Instructions:  May administer Cathflo as needed to maintain patency of vascular access device.   Instructions:  Flushing of vascular access device: per [facility] Protocol: 0.9% NaCl pre/post medication administration and prn patency; Heparin 100 u/ml, 34m for implanted ports and Heparin 10u/ml, 574mfor all other central venous catheters.   Instructions:  May follow [facility] Anaphylaxis Protocol for First Dose Administration in the home: 0.9% NaCl at 25-50 ml/hr to maintain IV access for protocol meds. Epinephrine 0.3 ml IV/IM PRN and Benadryl 25-50 IV/IM PRN s/s of anaphylaxis.   Instructions:  [facility] Coordinator (RN) to assist per patient IV care needs in the home PRN.   Increase activity slowly    Complete by:  As directed       ALLERGIES:  Allergies  Allergen Reactions  . Zosyn [Piperacillin Sod-Tazobactam So] Rash    Patient reports she has tolerated cephalexin in the past.  . Amlodipine   . Clindamycin/Lincomycin     Allergies as of 06/10/2016      Reactions   Zosyn [piperacillin Sod-tazobactam So] Rash   Patient reports she has tolerated cephalexin in the past.   Amlodipine    Clindamycin/lincomycin       Medication List    STOP taking these medications   chlorambucil 2 MG tablet Commonly  known as:  LEUKERAN   cloNIDine 0.3 MG tablet Commonly known as:  CATAPRES   DILTIAZEM CD 120 MG 24 hr capsule Generic drug:  diltiazem   hydrochlorothiazide 25 MG tablet Commonly known as:  HYDRODIURIL   JANUVIA 100 MG tablet Generic drug:  sitaGLIPtin   metFORMIN 1000 MG tablet Commonly known as:  GLUCOPHAGE   ramipril 10 MG capsule Commonly known as:  ALTACE     TAKE these medications   aspirin EC 81 MG tablet Take 81 mg by mouth daily.   ceFAZolin IVPB Commonly known as:  ANCEF Inject 2 g into the vein every 8 (eight) hours. Indication:  MSSA bacteremia Last Day of Therapy:  07/18/16 Labs - Once weekly:  CBC/D and BMP, Labs - Every other week:  ESR and CRP   cetirizine 5 MG tablet Commonly known as:  ZYRTEC Take 5 mg by mouth daily.   feeding supplement (NEPRO CARB STEADY) Liqd Take 237 mLs by mouth 2 (two) times daily between meals.   feeding supplement (PRO-STAT SUGAR FREE 64) Liqd Take 30 mLs by mouth daily.   insulin aspart 100 UNIT/ML injection Commonly known as:  novoLOG CBG 70 - 120: 0 units CBG 121 - 150: 3 units CBG 151 - 200: 4 units CBG 201 - 250: 7 units CBG 251 - 300: 11 units   insulin aspart 100 UNIT/ML injection Commonly known as:  novoLOG Inject 3 Units into the skin 3 (three) times daily with meals.   insulin glargine 100 UNIT/ML injection Commonly known as:  LANTUS Inject 0.28 mLs (28 Units total) into the skin daily.   latanoprost 0.005 % ophthalmic solution Commonly known as:  XALATAN Place 1 drop into the right eye at bedtime.   mupirocin ointment 2 % Commonly known as:  BACTROBAN Apply 1 application topically daily.  prednisoLONE acetate 1 % ophthalmic suspension Commonly known as:  PRED FORTE Place 1 drop into the left eye 6 (six) times daily.   SSD 1 % cream Generic drug:  silver sulfADIAZINE Apply 1 application topically daily.   timolol 0.25 % ophthalmic solution Commonly known as:  TIMOPTIC Place 1 drop into the right  eye daily.            Home Infusion Instuctions        Start     Ordered   06/10/16 0000  Home infusion instructions [facility] May follow [place] Dosing Protocol; May administer Cathflo as needed to maintain patency of vascular access device.; Flushing of vascular access device: per [facility] Protocol: 0.9% NaCl pre/post medica...    Question Answer Comment  Instructions May follow [place] Dosing Protocol   Instructions May administer Cathflo as needed to maintain patency of vascular access device.   Instructions Flushing of vascular access device: per [facility] Protocol: 0.9% NaCl pre/post medication administration and prn patency; Heparin 100 u/ml, 89m for implanted ports and Heparin 10u/ml, 559mfor all other central venous catheters.   Instructions May follow [facility] Anaphylaxis Protocol for First Dose Administration in the home: 0.9% NaCl at 25-50 ml/hr to maintain IV access for protocol meds. Epinephrine 0.3 ml IV/IM PRN and Benadryl 25-50 IV/IM PRN s/s of anaphylaxis.   Instructions [facility] Infusion Coordinator (RN) to assist per patient IV care needs in the home PRN.      06/10/16 1029       Follow-up Information    MaNewt MinionMD Follow up today.   Specialty:  Orthopedic Surgery Why:  Follow-up as needed. Contact information: [phone]           TOTAL DISCHARGE TIME: 35 minutes  [facility] Pager [phone]/24/2018, 11:26 AM

## 2016-06-10 NOTE — Consult Note (Signed)
ORTHOPAEDIC CONSULTATION  REQUESTING PHYSICIAN: Bonnielee Haff, MD  Chief Complaint: Chronic ulceration osteomyelitis left foot toes to 3 and 5  HPI: Tracy Barker is a 71 y.o. 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 Patient has had the fourth toe amputated in [facility]. Patient also has had ulcerations over the dorsal lateral aspect of her left foot.  Past Medical History:  Diagnosis Date  . Diabetes mellitus ([place])    Type 2  . 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 No pertinent surgical history. Social History   Social History  . Marital status: Unknown    Spouse name: N/A  . Number of children: N/A  . Years of education: N/A   Social History Main Topics  . Smoking status: Never Smoker  . Smokeless tobacco: Never Used  . Alcohol use No  . Drug use: No  . Sexual activity: No   Other Topics Concern  . None   Social History Narrative  . None   History reviewed. No pertinent family history. - negative except otherwise stated in the family history section Allergies  Allergen Reactions  . Zosyn [Piperacillin Sod-Tazobactam So] Rash    Patient reports she has tolerated cephalexin in the past.  . Amlodipine   . Clindamycin/Lincomycin    Prior to Admission medications   Medication Sig Start Date End Date Taking? Authorizing Provider  aspirin EC 81 MG tablet Take 81 mg by mouth daily.   Yes Historical Provider, MD  cetirizine (ZYRTEC) 5 MG tablet Take 5 mg by mouth daily.   Yes Historical Provider, MD  chlorambucil (LEUKERAN) 2 MG tablet Take 2 mg by mouth daily. 07/26/12  Yes Historical Provider, MD  cloNIDine (CATAPRES) 0.3 MG tablet Take 0.3 mg by mouth 2 (two) times daily. 07/07/10  Yes Historical Provider, MD  diltiazem (DILTIAZEM CD) 120 MG 24 hr capsule Take 120 mg by mouth daily. 09/13/09  Yes  Historical Provider, MD  hydrochlorothiazide (HYDRODIURIL) 25 MG tablet Take 25 mg by mouth daily. 08/30/09  Yes Historical Provider, MD  latanoprost (XALATAN) 0.005 % ophthalmic solution Place 1 drop into the right eye at bedtime. 04/02/14  Yes Historical Provider, MD  metFORMIN (GLUCOPHAGE) 1000 MG tablet Take 1,000 mg by mouth 2 (two) times daily with a meal. 01/07/16  Yes Historical Provider, MD  mupirocin ointment (BACTROBAN) 2 % Apply 1 application topically daily. 04/01/16  Yes Historical Provider, MD  prednisoLONE acetate (PRED FORTE) 1 % ophthalmic suspension Place 1 drop into the left eye 6 (six) times daily. 07/18/15  Yes Historical Provider, MD  ramipril (ALTACE) 10 MG capsule Take 10 mg by mouth 2 (two) times daily. 04/01/16  Yes Historical Provider, MD  silver sulfADIAZINE (SSD) 1 % cream Apply 1 application topically daily. 06/20/15  Yes Historical Provider, MD  sitaGLIPtin (JANUVIA) 100 MG tablet Take 100 mg by mouth daily. 04/06/16  Yes Historical Provider, MD  timolol (TIMOPTIC) 0.25 % ophthalmic solution Place 1 drop into the right eye daily. 06/26/13  Yes Historical Provider, MD   No results found. - pertinent xrays, CT, MRI studies were reviewed and independently interpreted  Positive ROS: All other systems have been reviewed and were otherwise negative with the exception of those mentioned in the HPI and as above.  Physical Exam: General: Alert, no acute distress Psychiatric: Patient is competent for consent  with normal mood and affect Lymphatic: No axillary or cervical lymphadenopathy Cardiovascular: Patient has pitting edema in both upper and lower extremities. Respiratory: Patient has congestion and pulmonary with shortness of breath at rest. GI: No organomegaly, abdomen is soft and non-tender  Skin: Examination patient has pitting edema of her upper and lower extremities. She has thin black eschar involving the PIP joints of toes 2 and 3 and 5 with a thin black eschar over  the dorsal lateral aspect of the left foot. She has pending decubitus ulcers of both heels.   Neurologic: Patient does not have protective sensation bilateral lower extremities.   MUSCULOSKELETAL:  Examination patient has a strong dorsalis pedis pulse bilaterally. She has pitting edema in the upper and lower extremities. Radiographs shows chronic osteomyelitis of the proximal phalanx at the PIP joint of the left second toe, there are also some cystic changes in the forefoot which could be consistent with history of gout. She has no ascending cellulitis no sausage digit swelling no signs of abscess.  Assessment: Assessment: Diabetic insensate neuropathy with renal disease with severe protein caloric malnutrition with  chronic osteomyelitis of the PIP joint of the left foot second toe with ulcerations of the second third and fifth toe with ulcer over the lateral aspect left foot status post fourth toe amputation. Plan:  Plan: Patient will eventually need a transmetatarsal amputation. Patient states she is not interested in surgery in [place] and would like to return to the [facility] area for surgical intervention to be near family. Patient shows no signs of cellulitis or abscess and surgical intervention for her chronic osteomyelitis is elective. I will follow-up as needed.  I will also order a uric acid level. Patient has some cystic changes in her forefoot which could be consistent with chronic gout.  Thank you for the consult and the opportunity to see [name], [place] [phone] 6:34 AM

## 2016-06-10 NOTE — Procedures (Signed)
R IJ tunneld CV cathter placement with [place] and fluoroscopy No complication No blood loss. See complete dictation in [facility].

## 2016-06-10 NOTE — Progress Notes (Signed)
Physical Therapy Treatment Patient Details Name: Tracy Barker MRN: [id number] DOB: 04/28/1946 Today's Date: 06/10/2016    History of Present Illness This 71 y.o. female transferred from [facility] ED with septic shock  due to staph areus thought to be due to osteomyelitis of Lt second toe .  Pt was intubated 05/27/16 >>05/29/16.  PMH includes:  CLL, DM, HTN, and h/o falls per H&P she was a "frequent flyer" in ED typically for falls      PT Comments    Quick to fatigue, but will push herself appropriately if she has a goal.  Follow Up Recommendations  SNF;Supervision/Assistance - 24 hour     Equipment Recommendations  None recommended by PT    Recommendations for Other Services       Precautions / Restrictions Precautions Precautions: Fall Required Braces or Orthoses: Other Brace/Splint Other Brace/Splint: LLE post op boot    Mobility  Bed Mobility                  Transfers Overall transfer level: Needs assistance Equipment used: Rolling walker (2 wheeled) Transfers: Sit to/from Stand Sit to Stand: Mod assist         General transfer comment: cues for hand placemen/sequencing  Ambulation/Gait Ambulation/Gait assistance: Min assist Ambulation Distance (Feet): 10 Feet (then additional 15 and 35 feet  with rest in between) Assistive device: Rolling walker (2 wheeled) Gait Pattern/deviations: Step-through pattern Gait velocity: slower   General Gait Details: wide BOS, variable distance from RW.  Visible fatigue and degrading of gait.   Stairs            Wheelchair Mobility    Modified Rankin (Stroke Patients Only)       Balance Overall balance assessment: Needs assistance Sitting-balance support: Bilateral upper extremity supported;Feet supported Sitting balance-Leahy Scale: Fair     Standing balance support: No upper extremity supported;During functional activity Standing balance-Leahy Scale: Poor Standing balance comment: still reliant on the  RW                    Cognition Arousal/Alertness: Awake/alert Behavior During Therapy: WFL for tasks assessed/performed Overall Cognitive Status: Within Functional Limits for tasks assessed                      Exercises      General Comments General comments (skin integrity, edema, etc.): Incontinent of urine  sats in the 90's overall      Pertinent Vitals/Pain Pain Assessment: Faces Faces Pain Scale: Hurts even more Pain Location: all over, bottom Pain Descriptors / Indicators: Aching;Sore Pain Intervention(s): Monitored during session;Repositioned    Home Living                      Prior Function            PT Goals (current goals can now be found in the care plan section) Acute Rehab PT Goals Patient Stated Goal: I want to go home PT Goal Formulation: With patient Time For Goal Achievement: 06/15/16 Potential to Achieve Goals: Good Progress towards PT goals: Progressing toward goals    Frequency    Min 3X/week      PT Plan Current plan remains appropriate    Co-evaluation             End of Session   Activity Tolerance: Patient tolerated treatment well Patient left: with call bell/phone within reach;in chair;with chair alarm set     Time: [phone]  PT Time Calculation (min) (ACUTE ONLY): 28 min  Charges:  $Gait Training: 8-22 mins $Therapeutic Activity: 8-22 mins                    G CodesTessie Fass Ade Stmarie 06/10/2016, 12:59 PM 06/10/2016  Donnella Sham, [place] [phone]  (pager)

## 2016-06-10 NOTE — Progress Notes (Addendum)
Occupational Therapy Treatment Patient Details Name: Tracy Barker MRN: [id number] DOB: January 21, 1946 Today's Date: 06/10/2016    History of present illness This 71 y.o. female transferred from [facility] ED with septic shock  due to staph areus thought to be due to osteomyelitis of Lt second toe .  Pt was intubated 05/27/16 >>05/29/16.  PMH includes:  CLL, DM, HTN, and h/o falls per H&P she was a "frequent flyer" in ED typically for falls     OT comments  Pt completed bed to chair transfer this session with grooming task. Pt with flat affect and needs cues to complete details hygiene. Pt with pending transfer today and very excited to complete transfer to [place].    Follow Up Recommendations  SNF;Supervision/Assistance - 24 hour    Equipment Recommendations  None recommended by OT    Recommendations for Other Services      Precautions / Restrictions Precautions Precautions: Fall Precaution Comments: incontinence with lack of awareness Required Braces or Orthoses: Other Brace/Splint Other Brace/Splint: LLE post op boot       Mobility Bed Mobility Overal bed mobility: Needs Assistance Bed Mobility: Supine to Sit Rolling: Mod assist   Supine to sit: Max assist;HOB elevated     General bed mobility comments: pt required bed deflated and (A) for BIL LE to eob. Pt with max (A) to elevate head from [place] ~40 degrees already. pt unable to complete task without (A).   Transfers Overall transfer level: Needs assistance Equipment used: Rolling walker (2 wheeled) Transfers: Sit to/from Stand Sit to Stand: +2 physical assistance;Mod assist         General transfer comment: pt transfered using the sara stedy total +2 (A) for safety and prevent falls    Balance Overall balance assessment: Needs assistance Sitting-balance support: Bilateral upper extremity supported;Feet supported Sitting balance-Leahy Scale: Poor     Standing balance support: Bilateral upper extremity  supported;During functional activity Standing balance-Leahy Scale: Zero Standing balance comment: reliant on bil Ue on sara stedy to just static stand and unable to sustain                   ADL Overall ADL's : Needs assistance/impaired Eating/Feeding: Set up;Bed level Eating/Feeding Details (indicate cue type and reason): pt eating muffin and milk due to decr effort required.  Grooming: Wash/dry hands;Set up Grooming Details (indicate cue type and reason): pt provided cloth for hands and wipe face. pt wiping across lips once. pt provided max (A) from therapist to remove dry skin from lips and applied lip moisture . pt reports "you have the gentle touch. I couldnt even tell"                  Toilet Transfer: +2 for physical assistance;Maximal assistance (stedy ) Toilet Transfer Details (indicate cue type and reason): simulated EOB to chair with sara stedy           General ADL Comments: pt with all over body tremor with movement. pt with skin more edema and redness compared to previous session. pt reports "itching" pt required total +2 with bed elevated to complete sit<>Stand. pt positioned in chair with pillows/ blankets in the bottom of chair to elevate seat surface      Vision                     Perception     Praxis      Cognition   Behavior During Therapy: [facility] for tasks assessed/performed Overall  Cognitive Status: Impaired/Different from baseline                  General Comments: pt incontinent which is new this admission. pt with all over rash redness to skin and appears unaware of this . pt just states "can you scratch my back i itch" pt very flat affect but agreeable. Pt initially declined to sit in recliner and needed education on why sitting in cloth chair is not appropriate at this time    Extremity/Trunk Assessment               Exercises Other Exercises Other Exercises: educated on shoulder elbow wrist and digit flexion to decr  edema present throughout bil UE   Shoulder Instructions       General Comments      Pertinent Vitals/ Pain       Pain Assessment: Faces Faces Pain Scale: Hurts little more Pain Location: all over Pain Descriptors / Indicators: Aching;Sore Pain Intervention(s): Monitored during session;Repositioned  Home Living                                          Prior Functioning/Environment              Frequency  Min 2X/week        Progress Toward Goals  OT Goals(current goals can now be found in the care plan section)  Progress towards OT goals: Progressing toward goals  Acute Rehab OT Goals Patient Stated Goal: I want to go home OT Goal Formulation: With patient Time For Goal Achievement: 06/13/16 Potential to Achieve Goals: Good ADL Goals Pt Will Perform Grooming: with min guard assist;standing Pt Will Perform Upper Body Bathing: with mod assist;sitting Pt/caregiver will Perform Home Exercise Program: Increased strength;Right Upper extremity;Left upper extremity;With minimal assist;With written HEP provided Additional ADL Goal #1: Pt will complete bed mobility supervision levle with hob <20 degrees and no bed rail as precursor to adls at home level  Plan Discharge plan remains appropriate    Co-evaluation                 End of Session Equipment Utilized During Treatment: Gait belt   Activity Tolerance Patient tolerated treatment well   Patient Left in chair;with call bell/phone within reach;with chair alarm set   Nurse Communication Mobility status;Precautions        Time: [id number] OT Time Calculation (min): 21 min  Charges: OT General Charges $OT Visit: 1 Procedure OT Treatments $Self Care/Home Management : 8-22 mins  Parke Poisson B 06/10/2016, 2:04 PM   Jeri Modena   OTR/L Pager: [phone] Office: [phone] .

## 2016-06-11 LAB — GLUCOSE, CAPILLARY
GLUCOSE-CAPILLARY: 158 mg/dL — AB (ref 65–99)
Glucose-Capillary: 139 mg/dL — ABNORMAL HIGH (ref 65–99)
Glucose-Capillary: 182 mg/dL — ABNORMAL HIGH (ref 65–99)

## 2016-06-11 NOTE — Progress Notes (Signed)
Pharmacy Antibiotic Note  Tracy Barker is a 71 y.o. female admitted on 05/27/2016 with MSSA bacteremia and cavitary PNA.  Pharmacy has been consulted for cefazolin dosing. Patient has Zosyn allergy listed and after further discussion with the patient she reports that she got a rash with Zosyn. She does state that she has tolerated cephalexin, a first-generation cephalosporin in the past.   From 1/24: SCr 1.56, CrCl~30-35 ml/min. Dose remains appropriate. Per ID - to continue antibiotics thru 3/3. Noted plans for discharge today.   Plan: 1. Continue Cefazolin 2g IV every 8 hours today 2. Noted likely discharge today, antibiotics to continue thru 3/3  Height: 5\' 4"  (162.6 cm) Weight: 169 lb 12.1 oz (77 kg) IBW/kg (Calculated) : 54.7  Temp (24hrs), Avg:98.9 F (37.2 C), Min:98 F (36.7 C), Max:99.6 F (37.6 C)   Recent Labs Lab 06/05/16 0741 06/06/16 1252 06/07/16 0349 06/08/16 0415 06/09/16 0409 06/10/16 0354  WBC 45.7* 38.9* 35.6* 26.3* 27.9* 24.8*  CREATININE 2.50* 2.09* 1.89* 1.63*  --  1.56*    Estimated Creatinine Clearance: 33.7 mL/min (by C-G formula based on SCr of 1.56 mg/dL (H)).    Allergies  Allergen Reactions  . Zosyn [Piperacillin Sod-Tazobactam So] Rash    Patient reports she has tolerated cephalexin in the past.  . Amlodipine   . Clindamycin/Lincomycin     1/10 Zyvox>> 1/14 1/10 Meropenem>>1/11 1/14 Ancef >> 1/17; 1/19 >>  1/18 Rocephin (short term until IV access res-established) >> 1/19   1/10 Resp panel: Rhino/Enterovirus 1/10 BCx: MSSA 1/10 urine: 40K yeast 1/10 resp: Few GPC in pairs, Few yeast (reincubated) 1/10 MRSA PCR neg 1/11 Trach asp: Mod staph aureus, mod C.albicans 1/11 BCx: 2/2 MSSA 1/13 BCx: 1/2 MSSA 1/17 BCx: 1/2 MSSA 1/21 BCx: ngtd  Thank you for allowing pharmacy to be a part of this patient's care.  Alycia Rossetti, PharmD, BCPS Clinical Pharmacist Pager: [phone] Clinical phone for 06/11/2016 from 7a-3:30p: [phone] If  after 3:30p, please call main pharmacy at: [phone] 06/11/2016 10:44 AM

## 2016-06-11 NOTE — Progress Notes (Signed)
The patient could not be discharged yesterday to her skilled nursing facility due to need for a central line for IV antibiotics. This was placed by interventional radiology on 1/24.  Patient feels well this morning. Denies any complaints. Wishes to go to her skilled nursing facility as soon as possible.  Vital signs reviewed. All noted to be stable. Temperature was in the 99 range overnight. Normal this morning. Examination is stable.  Please review discharge summary dictated yesterday for details regarding patient's hospital course, as well as instructions regarding outpatient care. No changes to that summary or any other medications.  Okay for discharge to skilled nursing facility today.  Bonnielee Haff 06/11/2016

## 2016-06-11 NOTE — Progress Notes (Signed)
Report called to [facility] at [facility] at [facility].  All questions answered.

## 2016-06-11 NOTE — Progress Notes (Signed)
Discharge instructions and medications discussed with patient.  AVS given to patient.  All questions answered.  

## 2016-06-12 LAB — CULTURE, BLOOD (ROUTINE X 2)
Culture: NO GROWTH
Culture: NO GROWTH

## 2018-01-15 IMAGING — CR DG CHEST 1V PORT
1 series · 1 of 1 positions shown · non-contrast
Comparison: None.

CLINICAL DATA: Difficulty intubation.  Central line placement.

EXAM:
PORTABLE CHEST 1 VIEW

[AP]
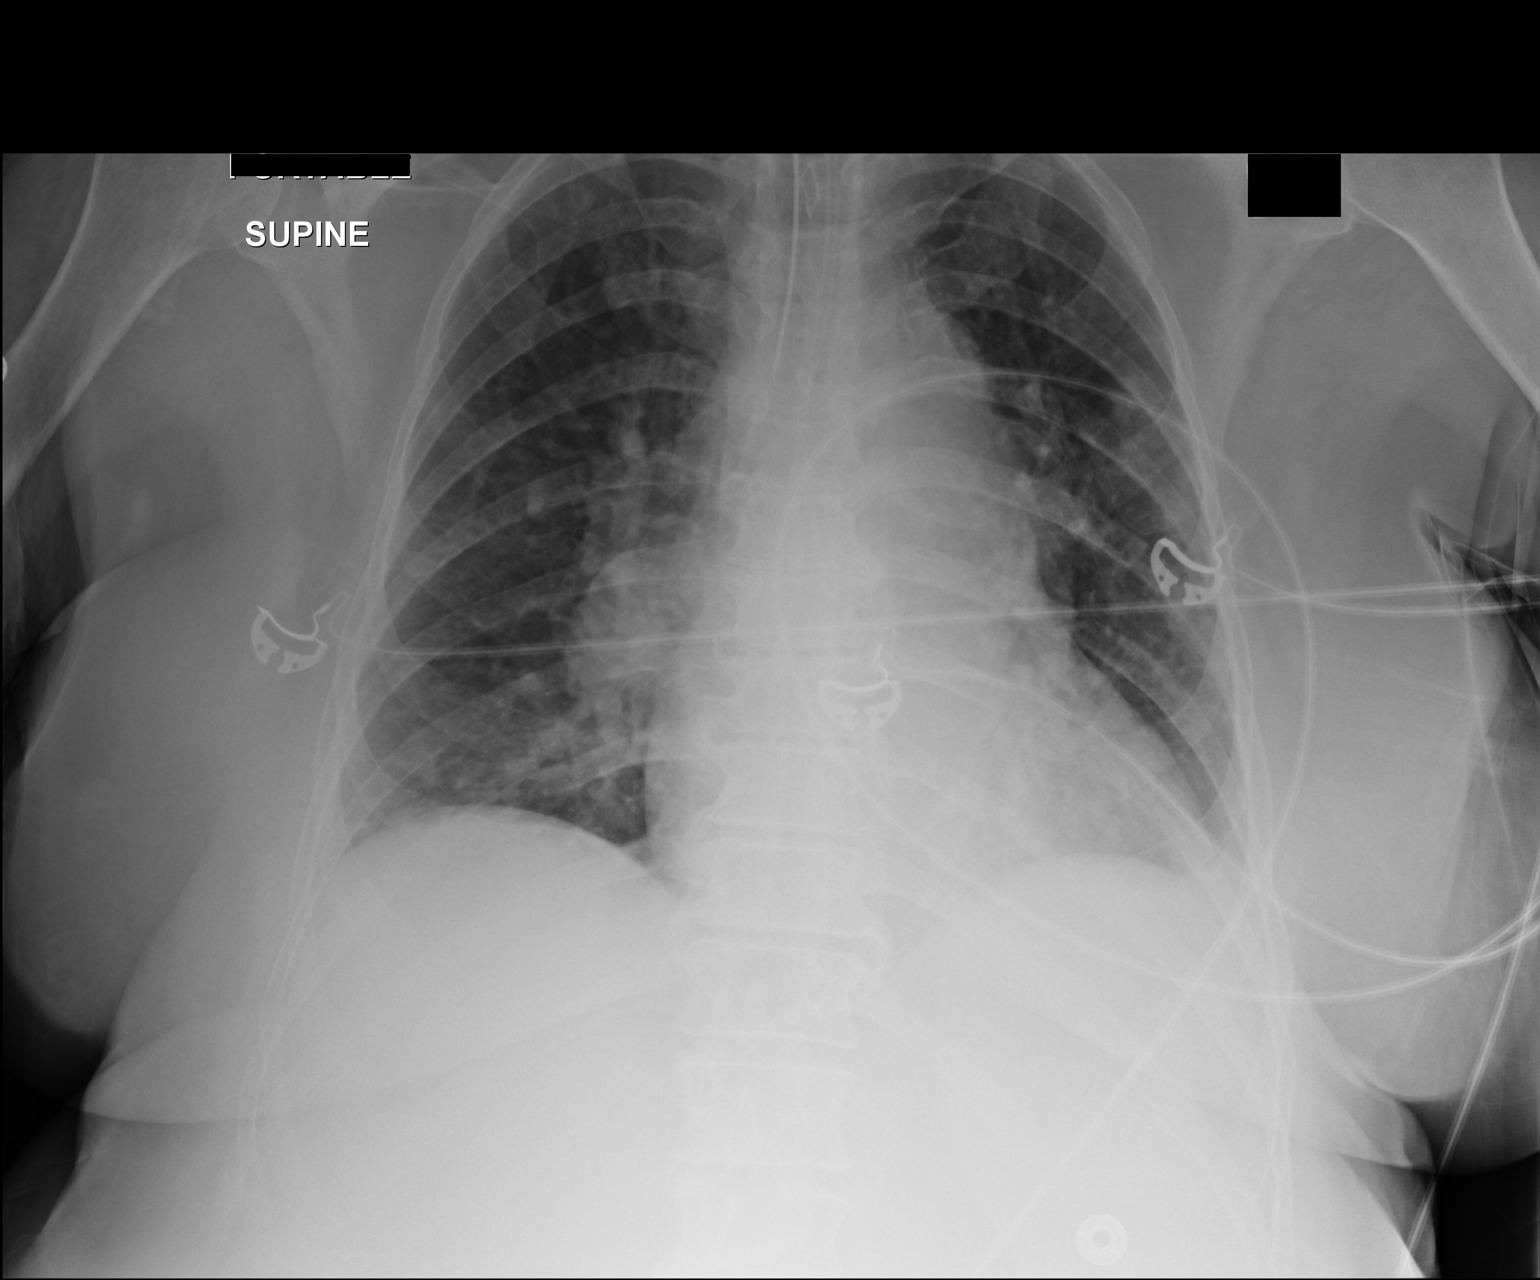

[1 of 1 positions shown; findings below may reference images not displayed]

FINDINGS: Cardiac enlargement without vascular congestion. Right hilum is
prominent. This could be due to central pulmonary artery dilatation
nor 2 lymphadenopathy. Consider CT for further evaluation. Vague
nodular opacities in the mid lungs measuring less than 1 cm. These
could also be evaluated at CT. No focal consolidation or airspace
disease. No blunting of costophrenic angles. No pneumothorax.
Tortuous aorta. Endotracheal tube tip measures 2.2 cm above the
carina.
IMPRESSION: Endotracheal tube tip measures 2.2 cm above the carina. Cardiac
enlargement. Nonspecific prominence of the right hilum with small
nodular opacities in both lungs are indeterminate. CT recommended
for further evaluation.

## 2018-01-15 IMAGING — CR DG ABD PORTABLE 1V
1 series · 1 of 1 positions shown · non-contrast
Comparison: None.

CLINICAL DATA: Orogastric tube placement

EXAM:
PORTABLE ABDOMEN - 1 VIEW

[AP]
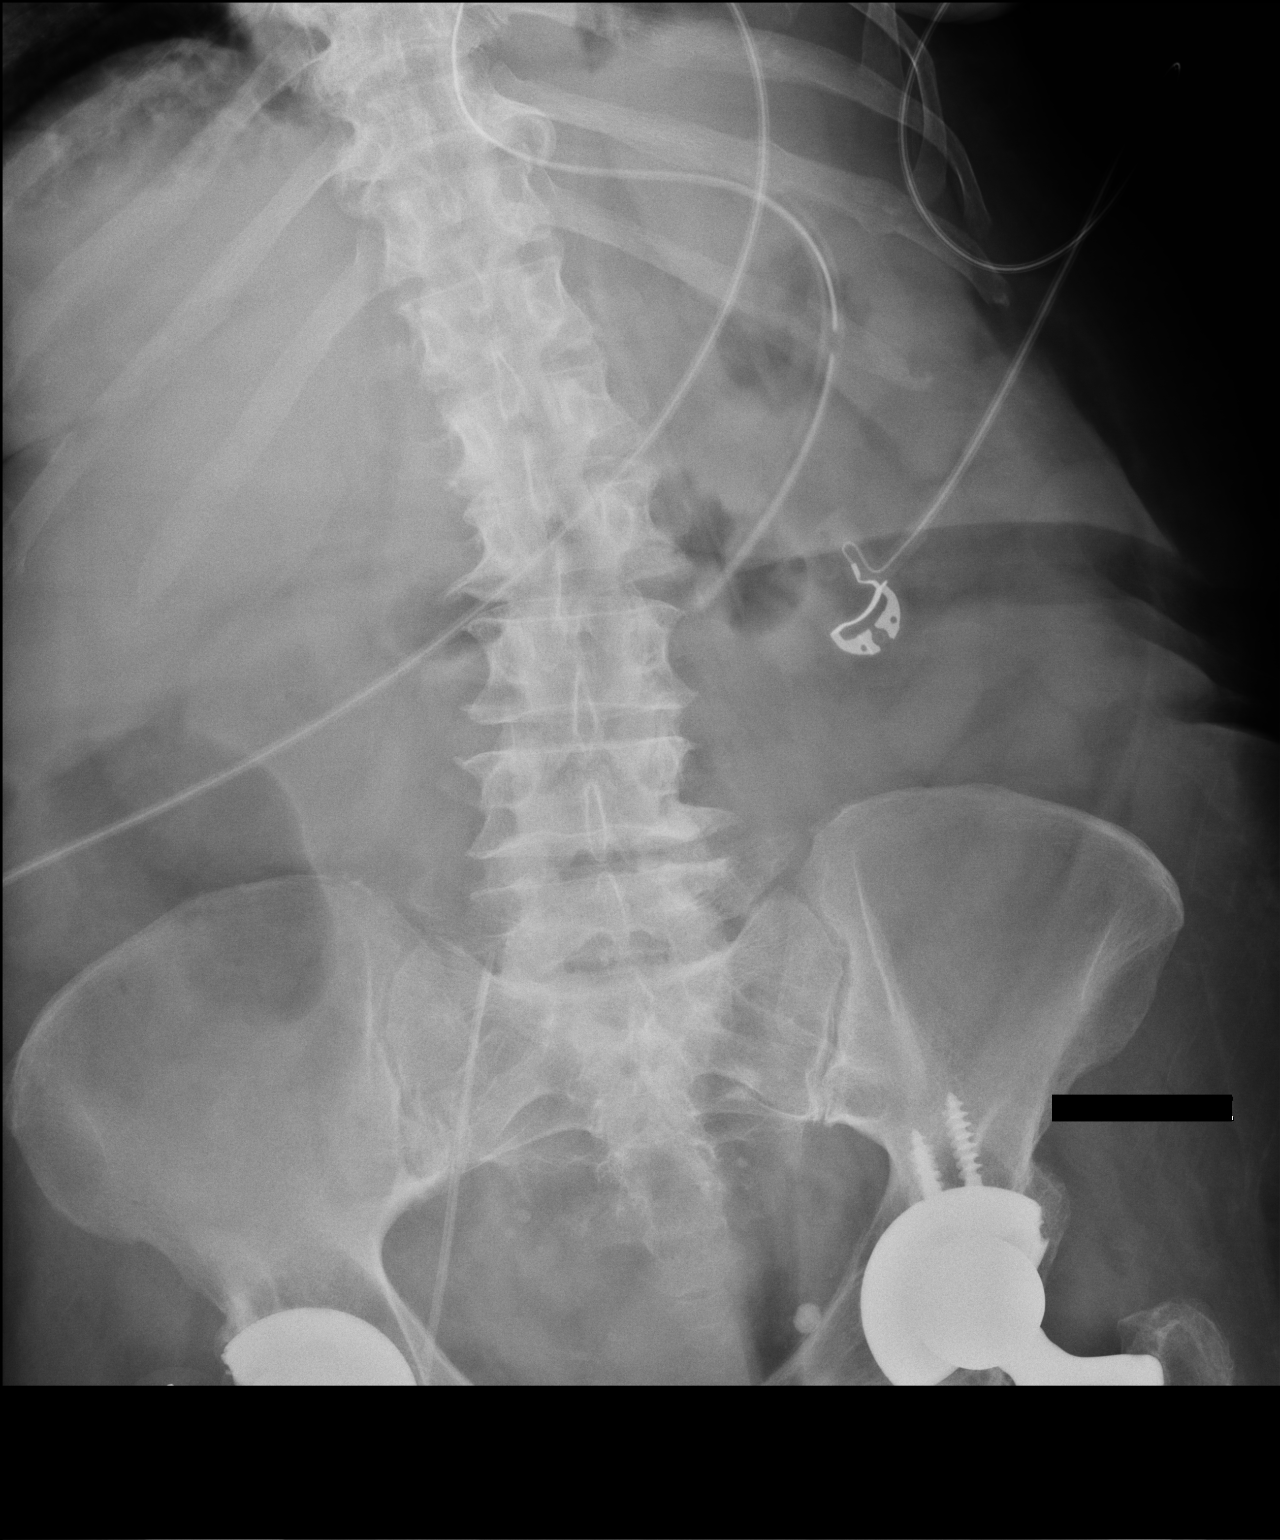

[1 of 1 positions shown; findings below may reference images not displayed]

FINDINGS: Orogastric tube tip and side port are in the stomach. There is a
catheter on the right placed in the right femoral region with tip at
the expected level of the right common iliac vein. There is no bowel
dilatation or air-fluid levels suggesting bowel obstruction. No free
air. There are total hip replacements bilaterally.
IMPRESSION: Orogastric tube tip and side port in stomach. Femoral catheter on
the right with tip in the expected location of the right common
iliac vein. Bowel gas pattern unremarkable. No bowel obstruction or
free air evident.

## 2018-01-15 IMAGING — CR DG CHEST 1V PORT
1 series · 1 of 1 positions shown · non-contrast
Comparison: CT chest 05/27/2016, radiograph 05/27/2016

CLINICAL DATA: Central line placement

EXAM:
PORTABLE CHEST 1 VIEW

[AP]
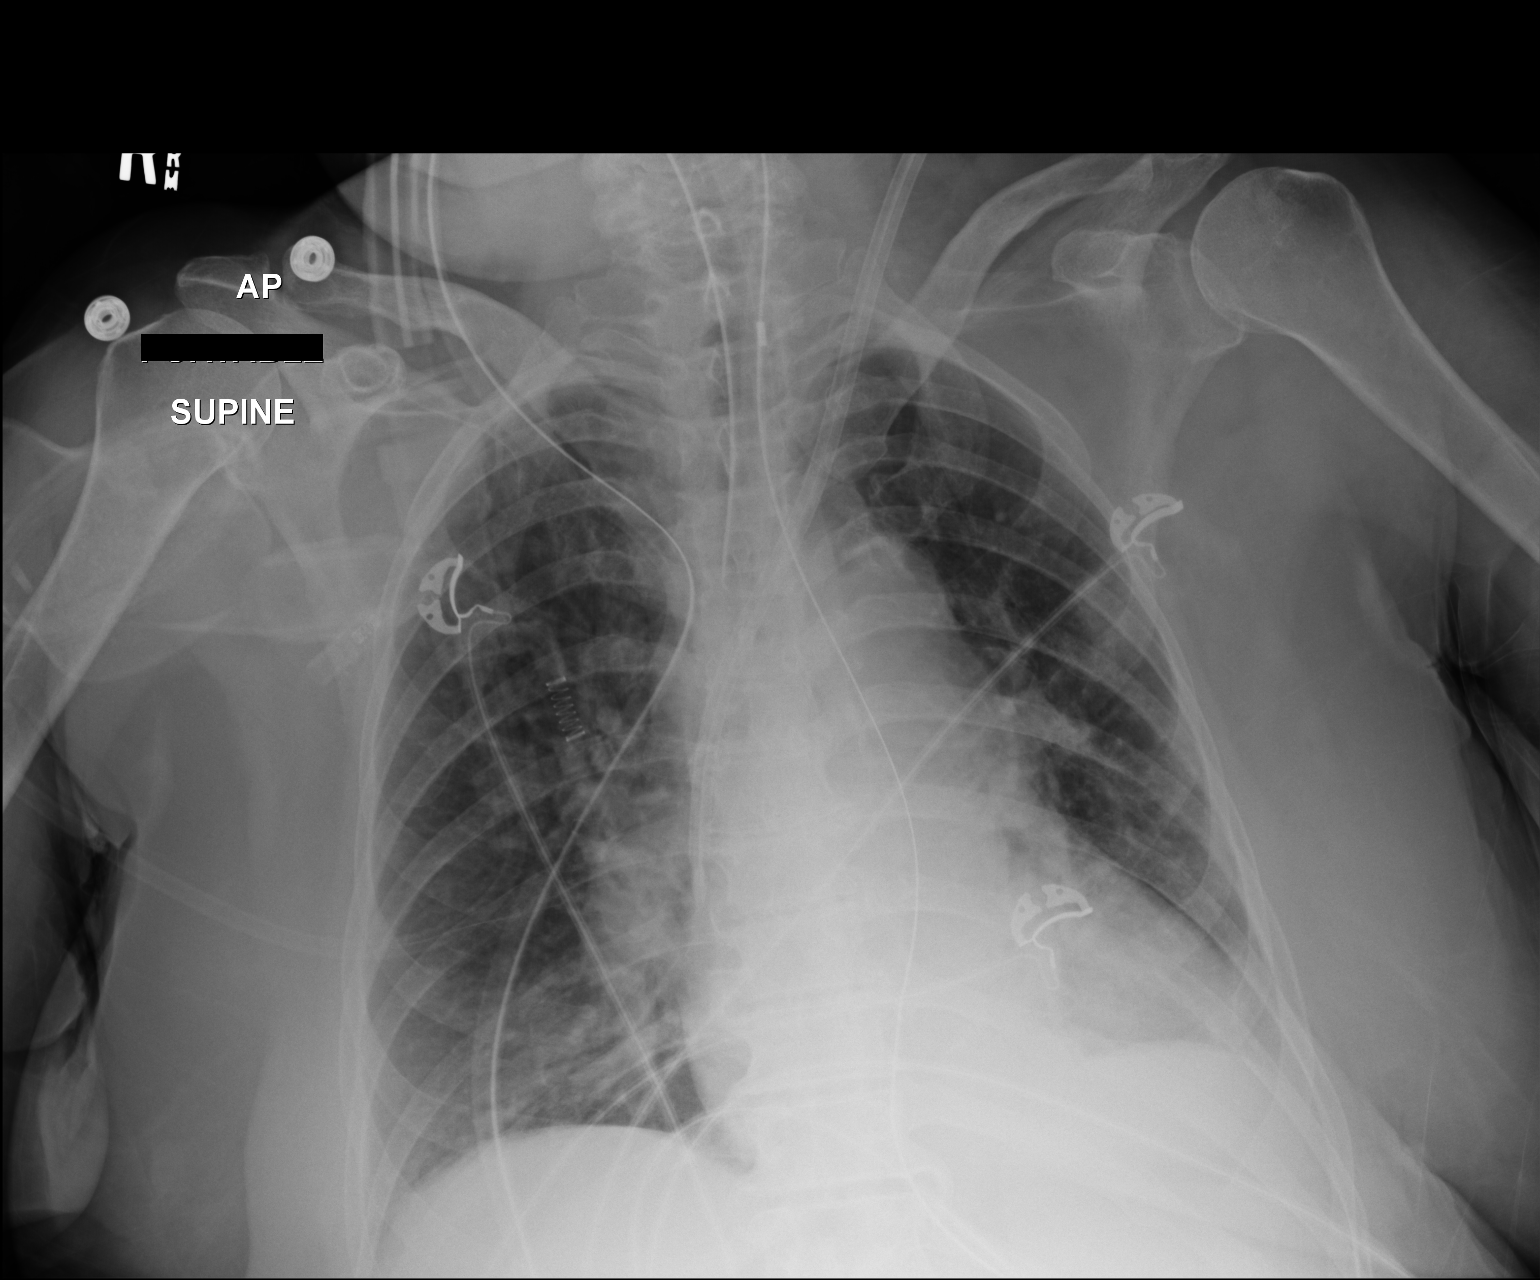

[1 of 1 positions shown; findings below may reference images not displayed]

FINDINGS: Endotracheal tube tip is approximately 2.3 cm superior to the
carina. Left-sided central venous catheter tip overlies the distal
SVC. No left pneumothorax is visualized.

Cardiomegaly and central vascular congestion persists. Bilateral
nodular lung opacities as before. Slight increased atelectasis or
infiltrate at the lung bases. Possible tiny left effusion.
IMPRESSION: 1. Left-sided central venous catheter tip overlies the distal SVC.
No pneumothorax
2. Cardiomegaly with central vascular congestion
3. Stable bilateral pulmonary nodular opacities. Slight increase in
bibasilar atelectasis or infiltrate. Possible tiny left effusion.

## 2018-01-16 IMAGING — CR DG FOOT 2V*L*
2 series · 2 of 2 positions shown · non-contrast
Comparison: No recent prior .

CLINICAL DATA: Swelling.  Ulcerations.

EXAM:
LEFT FOOT - 2 VIEW

[AP]
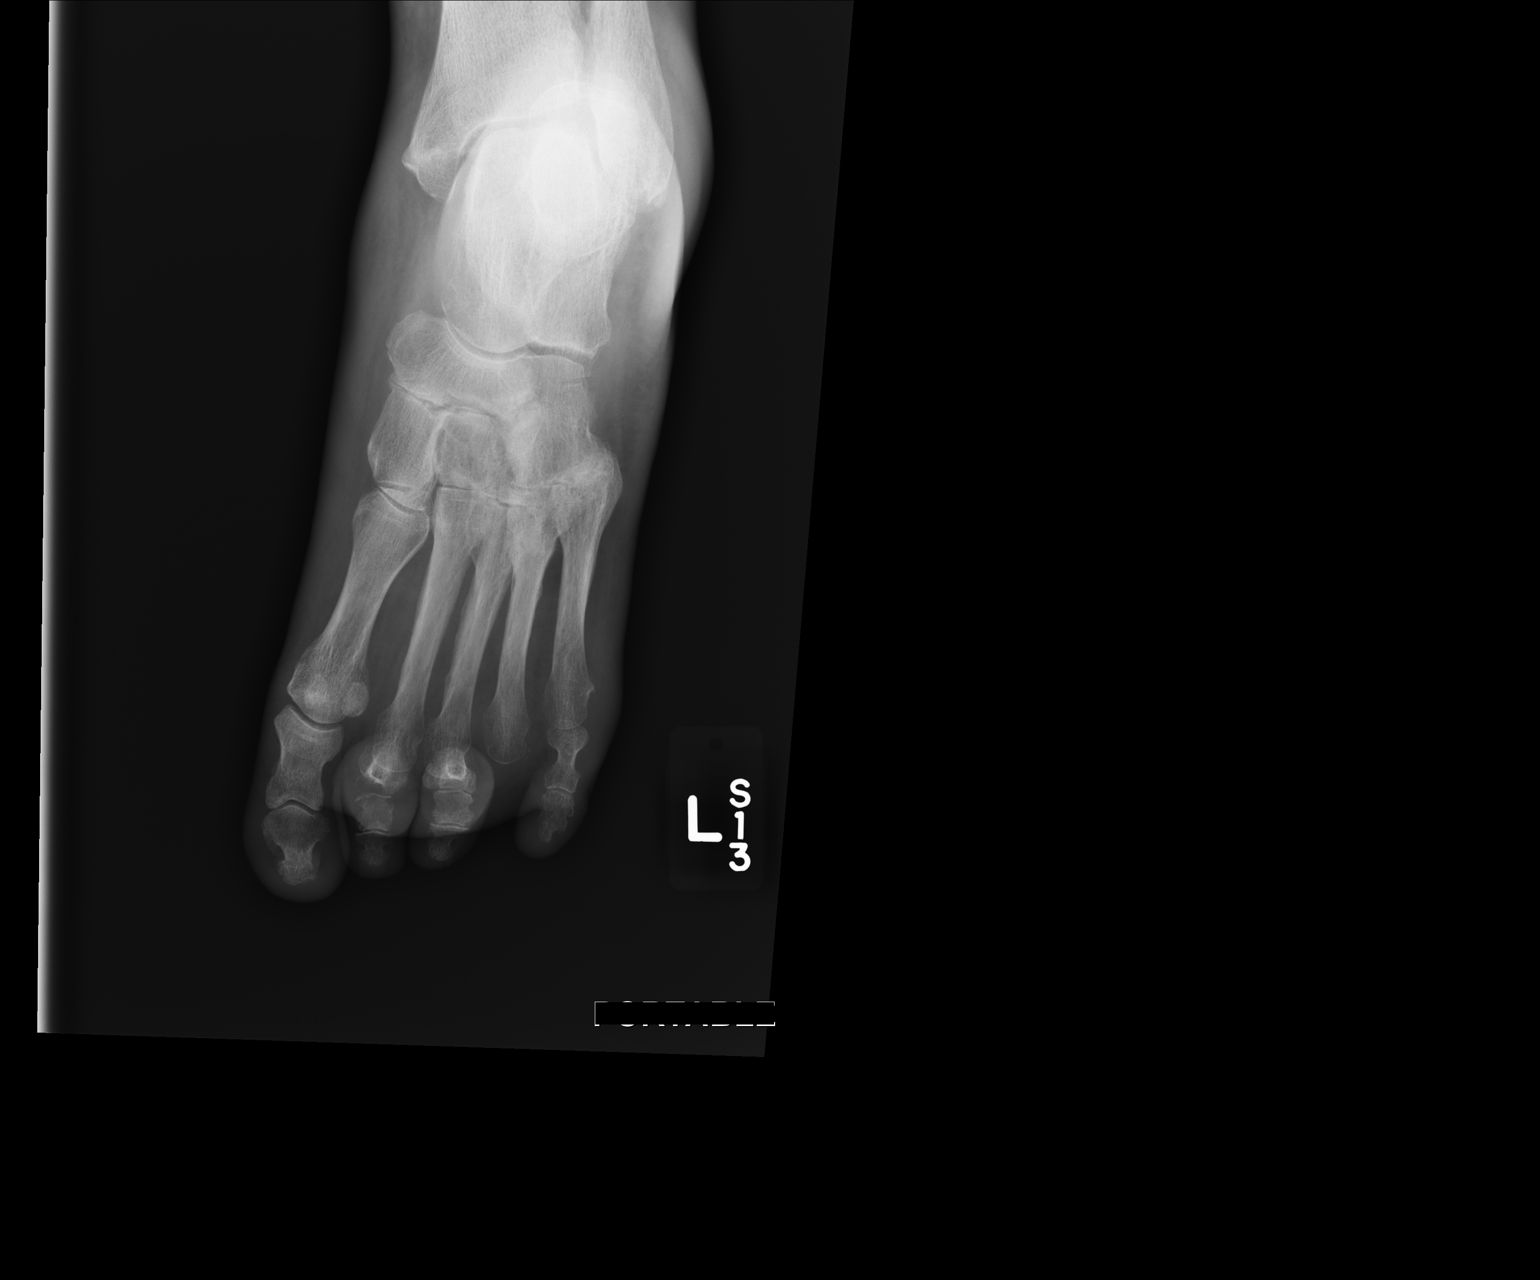

[lateral]
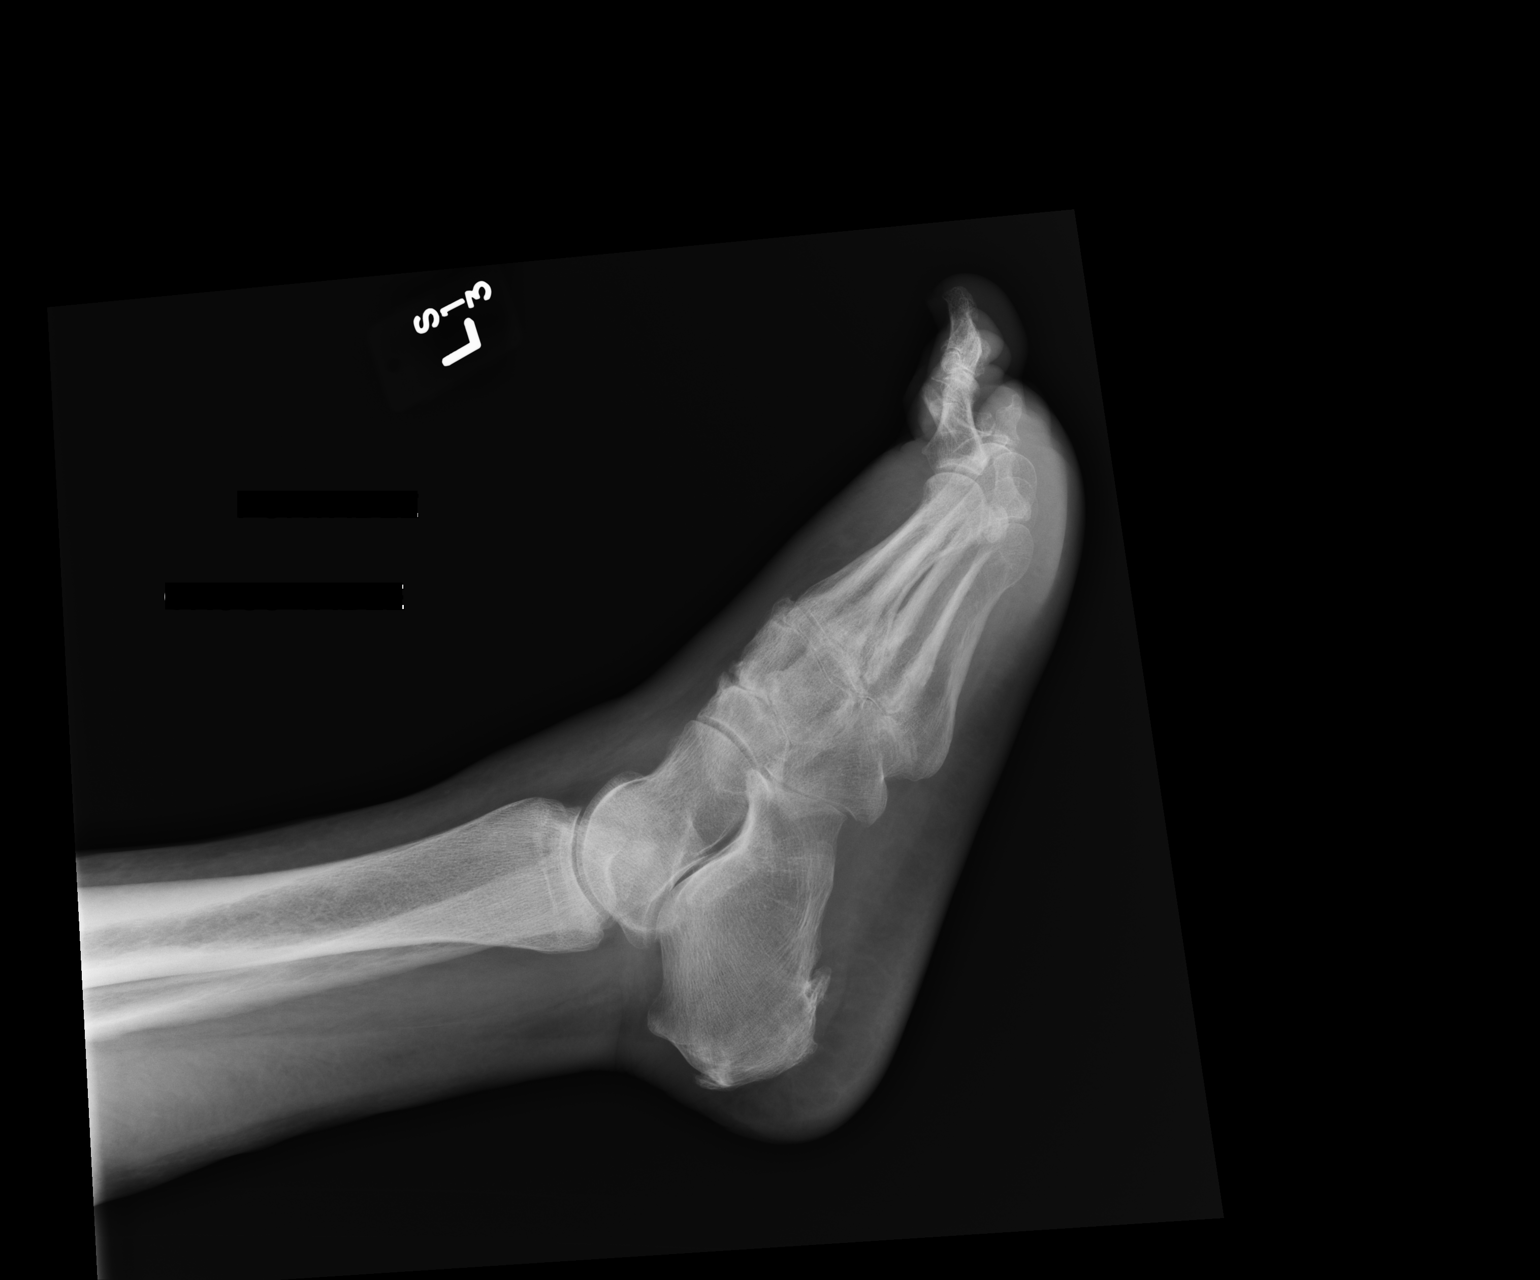

[2 of 2 positions shown; findings below may reference images not displayed]

FINDINGS: Diffuse soft tissue swelling, particular prominent the left second
and third digits. Left fourth digit amputation. Erosive changes
noted of the distal aspect of the proximal phalanx of the left
second digit and proximal aspect of the middle phalanx of the left
second digit. Osteomyelitis and septic arthritis could present in
this fashion. Subluxation of the metacarpal phalangeal joints of the
second and third digit noted. Degenerative changes noted about the
foot.
IMPRESSION: 1. Diffuse soft tissue swelling with particularly prominent soft
tissue swelling about the left second and third digits. Bony erosive
changes noted about the distal aspect of the proximal phalanx and
proximal aspect of the middle phalanx of the left second digit.
Findings consist with osteomyelitis and septic arthritis.

2. Subluxation of the second and third metacarpal phalangeal joints.

3.  Prior left fourth digit amputation.

## 2020-06-18 DEATH — deceased
